# Patient Record
Sex: Female | Born: 1975 | Race: White | Hispanic: No | Smoking: Former smoker
Health system: Southern US, Community
[De-identification: ages and names within clinical notes are randomized; demographics above are authoritative.]

## PROBLEM LIST (undated history)

## (undated) DIAGNOSIS — N809 Endometriosis, unspecified: Secondary | ICD-10-CM

## (undated) DIAGNOSIS — M1990 Unspecified osteoarthritis, unspecified site: Secondary | ICD-10-CM

## (undated) DIAGNOSIS — K219 Gastro-esophageal reflux disease without esophagitis: Secondary | ICD-10-CM

## (undated) DIAGNOSIS — M48061 Spinal stenosis, lumbar region without neurogenic claudication: Secondary | ICD-10-CM

## (undated) DIAGNOSIS — F419 Anxiety disorder, unspecified: Secondary | ICD-10-CM

## (undated) HISTORY — DX: Unspecified osteoarthritis, unspecified site: M19.90

## (undated) HISTORY — DX: Anxiety disorder, unspecified: F41.9

## (undated) HISTORY — PX: WISDOM TOOTH EXTRACTION: SHX21

## (undated) HISTORY — DX: Endometriosis, unspecified: N80.9

## (undated) HISTORY — DX: Spinal stenosis, lumbar region without neurogenic claudication: M48.061

## (undated) HISTORY — PX: ABDOMINAL HYSTERECTOMY: SHX81

---

## 2008-10-06 ENCOUNTER — Encounter: Admission: RE | Admit: 2008-10-06 | Discharge: 2008-10-06 | Payer: Self-pay | Admitting: Family Medicine

## 2009-01-06 ENCOUNTER — Ambulatory Visit: Payer: Self-pay | Admitting: Vascular Surgery

## 2010-05-17 NOTE — Procedures (Signed)
LOWER EXTREMITY VENOUS REFLUX EXAM   INDICATION:  Bilateral lower extremity swelling and pain.   EXAM:  Using color-flow imaging and pulse Doppler spectral analysis, the  bilateral common femoral, superficial femoral, popliteal, posterior  tibial, greater and lesser saphenous veins are evaluated.  There is no  evidence suggesting deep venous insufficiency in the bilateral lower  extremities.   The bilateral saphenofemoral junction is competent. The bilateral GSV is  competent.   The bilateral proximal short saphenous vein demonstrates competency.   GSV Diameter (used if found to be incompetent only)                                            Right    Left  Proximal Greater Saphenous Vein           cm       cm  Proximal-to-mid-thigh                     cm       cm  Mid thigh                                 cm       cm  Mid-distal thigh                          cm       cm  Distal thigh                              cm       cm  Knee                                      cm       cm   IMPRESSION:  1. Bilateral greater saphenous veins are competent.  2. The deep venous system is competent.  3. The bilateral lesser saphenous veins are competent.   ___________________________________________  Janetta Hora. Fields, MD   CJ/MEDQ  D:  01/06/2009  T:  01/06/2009  Job:  [id number]

## 2010-05-17 NOTE — Assessment & Plan Note (Signed)
OFFICE VISIT   Howard, Selena  DOB:  1975-03-16                                       01/06/2009  [id number]#:[id number]   CHIEF COMPLAINT:  Leg swelling.   HISTORY OF PRESENT ILLNESS:  The patient is a 35 year old female  referred from [facility] for evaluation of leg  swelling.  The patient states that she has intermittently been having  swelling of her right ankle and calf for approximately 5-6 months.  She  states it had been present for at least a year but worse over the last  few months.  She does not notice any real relationship of the swelling  to standing.  She has had no relief with TED stockings in the past.  She  has no prior history of DVT.  She has no family history of vein problems  or clotting.  She recently developed a rash on the right dorsal portion  of her foot.  She has had no itching from this.  She occasionally has  some pain in her calf on walking.   She has no other significant past medical or social history.   FAMILY HISTORY:  Unremarkable.   SOCIAL HISTORY:  She is single, has three children.  She is currently a  Consulting civil engineer.  She is a smoker of one pack a day.  She does not consume  alcohol regularly.   REVIEW OF SYSTEMS:  Full 12 point review of systems was performed with  the patient.  Please see intake referral form for details regarding  this.   PHYSICAL EXAM:  Vital signs:  Temperature is 98, blood pressure 96/64,  heart rate 72 and regular.  HEENT:  Unremarkable.  Neck:  Has 2+ carotid  pulses without bruit.  Chest:  Clear to auscultation.  Heart:  Regular  rate and rhythm without murmur.  Abdomen:  Soft, nontender,  nondistended.  No masses.  Extremities:  She has 2+ radial, femoral, 1+  dorsalis pedis and posterior tibial pulses bilaterally.  Musculoskeletal:  Shows no major joint deformities.  Neurologic:  Shows  symmetric upper extremity and lower extremity motor strength which is  5/5 and  symmetric.  Skin:  She has a papular rash approximately 3 cm in  diameter on the dorsal aspect of her right foot near the web space of  toes one and two.  She has no cervical or inguinal lymphadenopathy.   She had a venous duplex ultrasound in our office today which showed no  obvious varicose veins.  She had no reflux across her left or right  greater saphenous vein.  She also has previously had a DVT ultrasound at  [facility] which was negative for DVT.   In summary, the patient has chronic leg swelling of her right leg.  I  doubt this represents lymphedema and it may be lipedema especially with  her weight fluctuating up and down.  I discussed with her today that  although I did not know the exact cause of her leg swelling that the  best option for her probably would be long-term compression stockings.  I have prescribed for her today 25-30 mm compression stockings which  will give her a tighter squeeze than TED stockings alone.  She will  follow up with me on an as-needed basis.     Janetta Hora. Darrick Penna, MD  Electronically Signed   CEF/MEDQ  D:  01/08/2009  T:  01/08/2009  Job:  [id number]   cc:   Aaron Edelman [facility] [place]

## 2020-10-05 ENCOUNTER — Telehealth: Payer: Self-pay

## 2020-10-05 NOTE — Telephone Encounter (Signed)
Yes, I will take Ms Robinette's daughter.  That is fine.

## 2020-10-05 NOTE — Telephone Encounter (Signed)
Are you taking as new patient?

## 2020-10-20 ENCOUNTER — Ambulatory Visit: Payer: Medicaid Other | Admitting: Family Medicine

## 2020-10-20 ENCOUNTER — Encounter: Payer: Self-pay | Admitting: Family Medicine

## 2020-10-20 ENCOUNTER — Other Ambulatory Visit: Payer: Self-pay

## 2020-10-20 VITALS — BP 134/86 | HR 66 | Temp 97.3°F | Ht 63.0 in | Wt 248.4 lb

## 2020-10-20 DIAGNOSIS — Z7689 Persons encountering health services in other specified circumstances: Secondary | ICD-10-CM | POA: Diagnosis not present

## 2020-10-20 DIAGNOSIS — M2550 Pain in unspecified joint: Secondary | ICD-10-CM

## 2020-10-20 DIAGNOSIS — R21 Rash and other nonspecific skin eruption: Secondary | ICD-10-CM

## 2020-10-20 DIAGNOSIS — M48061 Spinal stenosis, lumbar region without neurogenic claudication: Secondary | ICD-10-CM

## 2020-10-20 DIAGNOSIS — K219 Gastro-esophageal reflux disease without esophagitis: Secondary | ICD-10-CM

## 2020-10-20 MED ORDER — BETAMETHASONE VALERATE 0.1 % EX CREA: TOPICAL_CREAM | Freq: Two times a day (BID) | CUTANEOUS | 1 refills | Status: DC

## 2020-10-20 MED ORDER — ESOMEPRAZOLE MAGNESIUM 40 MG PO CPDR: 40.0000 mg | DELAYED_RELEASE_CAPSULE | Freq: Every day | ORAL | 3 refills | Status: DC

## 2020-10-20 MED ORDER — TRAMADOL HCL 50 MG PO TABS: 50.0000 mg | ORAL_TABLET | Freq: Two times a day (BID) | ORAL | 0 refills | Status: DC | PRN

## 2020-10-20 MED ORDER — GABAPENTIN 300 MG PO CAPS: 300.0000 mg | ORAL_CAPSULE | Freq: Three times a day (TID) | ORAL | 3 refills | Status: DC

## 2020-10-20 NOTE — Patient Instructions (Signed)
Come in for fasting labs.  We are checking for autoimmune disease markers, cholesterol, sugar, kidney function and thyroid. Nexium added for acid reflux Betamethasone cream sent for rash on foot Increase gabapentin to 300mg  3 times daily as tolerated.  Small supply of Tramadol (do not recommend long term use) Please call you previous doctor to get results of MRI/ testing ,etc so we can determine need for specialist.  Food Choices for Gastroesophageal Reflux Disease, Adult When you have gastroesophageal reflux disease (GERD), the foods you eat and your eating habits are very important. Choosing the right foods can help ease your discomfort. Think about working with a food expert (dietitian) to help you make good choices. What are tips for following this plan? Reading food labels Look for foods that are low in saturated fat. Foods that may help with your symptoms include: Foods that have less than 5% of daily value (DV) of fat. Foods that have 0 grams of trans fat. Cooking Do not fry your food. Cook your food by baking, steaming, grilling, or broiling. These are all methods that do not need a lot of fat for cooking. To add flavor, try to use herbs that are low in spice and acidity. Meal planning  Choose healthy foods that are low in fat, such as: Fruits and vegetables. Whole grains. Low-fat dairy products. Lean meats, fish, and poultry. Eat small meals often instead of eating 3 large meals each day. Eat your meals slowly in a place where you are relaxed. Avoid bending over or lying down until 2-3 hours after eating. Limit high-fat foods such as fatty meats or fried foods. Limit your intake of fatty foods, such as oils, butter, and shortening. Avoid the following as told by your doctor: Foods that cause symptoms. These may be different for different people. Keep a food diary to keep track of foods that cause symptoms. Alcohol. Drinking a lot of liquid with meals. Eating meals during the  2-3 hours before bed. Lifestyle Stay at a healthy weight. Ask your doctor what weight is healthy for you. If you need to lose weight, work with your doctor to do so safely. Exercise for at least 30 minutes on 5 or more days each week, or as told by your doctor. Wear loose-fitting clothes. Do not smoke or use any products that contain nicotine or tobacco. If you need help quitting, ask your doctor. Sleep with the head of your bed higher than your feet. Use a wedge under the mattress or blocks under the bed frame to raise the head of the bed. Chew sugar-free gum after meals. What foods should eat? Eat a healthy, well-balanced diet of fruits, vegetables, whole grains, low-fat dairy products, lean meats, fish, and poultry. Each person is different. Foods that may cause symptoms in one person may not cause any symptoms in another person. Work with your doctor to find foods that are safe for you. The items listed above may not be a complete list of what you can eat and drink. Contact a food expert for more options. What foods should I avoid? Limiting some of these foods may help in managing the symptoms of GERD. Everyone is different. Talk with a food expert or your doctor to help you find the exact foods to avoid, if any. Fruits Any fruits prepared with added fat. Any fruits that cause symptoms. For some people, this may include citrus fruits, such as oranges, grapefruit, pineapple, and lemons. Vegetables Deep-fried vegetables. [place] fries. Any vegetables prepared with added  fat. Any vegetables that cause symptoms. For some people, this may include tomatoes and tomato products, chili peppers, onions and garlic, and horseradish. Grains Pastries or quick breads with added fat. Meats and other proteins High-fat meats, such as fatty beef or pork, hot dogs, ribs, ham, sausage, salami, and bacon. Fried meat or protein, including fried fish and fried chicken. Nuts and nut butters, in large  amounts. Dairy Whole milk and chocolate milk. Sour cream. Cream. Ice cream. Cream cheese. Milkshakes. Fats and oils Butter. Margarine. Shortening. Ghee. Beverages Coffee and tea, with or without caffeine. Carbonated beverages. Sodas. Energy drinks. Fruit juice made with acidic fruits, such as orange or grapefruit. Tomato juice. Alcoholic drinks. Sweets and desserts Chocolate and cocoa. Donuts. Seasonings and condiments Pepper. Peppermint and spearmint. Added salt. Any condiments, herbs, or seasonings that cause symptoms. For some people, this may include curry, hot sauce, or vinegar-based salad dressings. The items listed above may not be a complete list of what you should not eat and drink. Contact a food expert for more options. Questions to ask your doctor Diet and lifestyle changes are often the first steps that are taken to manage symptoms of GERD. If diet and lifestyle changes do not help, talk with your doctor about taking medicines. Where to find more information International Foundation for Gastrointestinal Disorders: aboutgerd.org Summary When you have GERD, food and lifestyle choices are very important in easing your symptoms. Eat small meals often instead of 3 large meals a day. Eat your meals slowly and in a place where you are relaxed. Avoid bending over or lying down until 2-3 hours after eating. Limit high-fat foods such as fatty meats or fried foods. This information is not intended to replace advice given to you by your health care provider. Make sure you discuss any questions you have with your health care provider. Document Revised: 06/30/2019 Document Reviewed: 06/30/2019 Elsevier Patient Education  [place].

## 2020-10-20 NOTE — Progress Notes (Signed)
Subjective: ME:QASTMHDQQ care, chronic back pain, polyarthralgia, rash, GERD HPI: Selena Howard is a 45 y.o. female presenting to clinic today for:  1.  Chronic back pain/polyarthralgia Patient reports that she has suffered from chronic back pain for some time now.  It radiates to bilateral hips and sometimes down to the bottoms of her feet bilaterally.  She gets some numbness and tingling in the right calf specifically.  She apparently had an MRI with her previous PCP and that apparently demonstrated some spinal stenosis and other arthritic findings.  She unfortunately was never referred to a specialist because she had other medical issues going on with abnormal uterine bleeding and with her parents.  She has been taking gabapentin 200 mg twice daily and tramadol very sparingly as needed for breakthrough pain.  She suffers from chronic constipation so this has not necessarily been exacerbated since starting use of tramadol more frequently.  She does not report any falls or any preceding injuries.  She does admit to some weight gain since her hysterectomy in June but is not sure if that is related to the back pain or other medical issue.  She of course understands that the increased weight exacerbates her back pain and therefore she is motivated to get some of the weight off of her.  She notes more than just back pain however.  Sometimes she has ankle and foot pain.  She notes a rash on the right foot and is currently being treated with some type of solution for her nails.  She wonders though if this is really a psoriatic type finding.  No corticosteroid treatment thus far.  She describes it as itchy and it does scale  2.  GERD Since she has gained the weight she has noticed increased acid reflux.  She has been utilizing Tums quite frequently at bedtime.  She has not utilize any PPI or other over-the-counter remedies for acid reflux.  She reports no vomiting, hematochezia or melena.  No unplanned  weight loss.  3.  Anxiety disorder Patient reports good control of anxiety disorder with fluoxetine 20 mg.  Past Medical History:  Diagnosis Date   Anxiety    Arthritis    Endometriosis    now s/p partial hysterectomy 06/2020   Spinal stenosis at L4-L5 level    Past Surgical History:  Procedure Laterality Date   ABDOMINAL HYSTERECTOMY     partial, ovary sparing   CESAREAN SECTION     WISDOM TOOTH EXTRACTION Bilateral    Social History   Socioeconomic History   Marital status: Divorced    Spouse name: Not on file   Number of children: 3   Years of education: Not on file   Highest education level: Not on file  Occupational History   Not on file  Tobacco Use   Smoking status: Former    Packs/day: 0.50    Years: 10.00    Pack years: 5.00    Types: Cigarettes    Quit date: 06/02/2020    Years since quitting: 0.3   Smokeless tobacco: Never  Vaping Use   Vaping Use: Never used  Substance and Sexual Activity   Alcohol use: Not Currently   Drug use: Not Currently   Sexual activity: Not Currently  Other Topics Concern   Not on file  Social History Narrative   Mother also sees me, [name]   Social Determinants of Health   Financial Resource Strain: Not on file  Food Insecurity: Not on file  Transportation  Needs: Not on file  Physical Activity: Not on file  Stress: Not on file  Social Connections: Not on file  Intimate Partner Violence: Not on file   No outpatient medications have been marked as taking for the 10/20/20 encounter (Appointment) with [name], DO.   Family History  Problem Relation Age of Onset   Diabetes Mother    Liver disease Mother    COPD Father    Diabetes Father    Heart disease Maternal Grandmother    Cancer Maternal Grandfather    Cancer Paternal Grandmother    Diabetes Paternal Grandfather    Allergies  Allergen Reactions   Keflex [Cephalexin] Nausea And Vomiting     Health Maintenance: Flu vaccine  ROS: Per  HPI  Objective: Office vital signs reviewed. BP 134/86   Pulse 66   Temp (!) 97.3 F (36.3 C)   Ht 5' 3"  (1.6 m)   Wt 248 lb 6.4 oz (112.7 kg)   SpO2 99%   BMI 44.00 kg/m   Physical Examination:  General: Awake, alert, well-appearing, morbidly obese, No acute distress HEENT: Normal; moist mucous membranes.  No exophthalmos.  No palpable thyromegaly or thyroid nodules Cardio: regular rate and rhythm, S1S2 heard, no murmurs appreciated Pulm: clear to auscultation bilaterally, no wheezes, rhonchi or rales; normal work of breathing on room air GI: Abdomen obese MSK: Normal gait and station with normal tone.  Patient is ambulating independently Psych: Very pleasant, interactive.  Somewhat anxious on exam.  Depression screen [facility] 2/9 10/20/2020 10/20/2020  Decreased Interest 0 0  Down, Depressed, Hopeless 0 0  PHQ - 2 Score 0 0  Altered sleeping 3 -  Tired, decreased energy 1 -  Change in appetite 0 -  Feeling bad or failure about yourself  0 -  Trouble concentrating 1 -  Moving slowly or fidgety/restless 0 -  Suicidal thoughts 0 -  PHQ-9 Score 5 -  Difficult doing work/chores Not difficult at all -   GAD 7 : Generalized Anxiety Score 10/20/2020  Nervous, Anxious, on Edge 0  Control/stop worrying 0  Worry too much - different things 0  Trouble relaxing 0  Restless 0  Easily annoyed or irritable 0  Afraid - awful might happen 0  Total GAD 7 Score 0  Anxiety Difficulty Not difficult at all   Assessment/ Plan: 45 y.o. female   Rash of foot - Plan: betamethasone valerate (VALISONE) 0.1 % cream, CBC, ANA w/Reflex if Positive, C-reactive protein, Sedimentation Rate  Establishing care with new doctor, encounter for  Spinal stenosis of lumbar region without neurogenic claudication - Plan: gabapentin (NEURONTIN) 300 MG capsule, traMADol (ULTRAM) 50 MG tablet  Gastroesophageal reflux disease without esophagitis - Plan: esomeprazole (NEXIUM) 40 MG capsule  Morbid obesity  ([place]) - Plan: CMP14+EGFR, Lipid panel, TSH, T4, free, Insulin, random, Bayer DCA Hb A1c Waived  Polyarthralgia - Plan: CBC, ANA w/Reflex if Positive, C-reactive protein, Sedimentation Rate  For the rash on her foot I have given her betamethasone to apply.  It does seem suspicious for psoriasis which could also lead to [place] considering psoriatic arthritis as a source of her polyarthralgia.  I placed some autoimmune labs and I would like her to come in for fasting labs and have these collected together.  I am going to advance her gabapentin to 300 mg 3 times daily we discussed that there is some relation to increased weight with this medicine.  So we will need to pay special attention to that.  I reviewed the national narcotic database and have given her a short supply of tramadol.  Reinforced that this is not intended for long-term use and simply will be used only if she is having breakthrough severe pain.  Again, I reinforced need to get records from her previous PCP.  Would like to reevaluate what her MRI results were and we will plan for referral accordingly.  I suspect that her GERD is largely related to the weight gain that she is experienced.  Since symptoms are refractory to Tums we will try short course of Nexium.  I will reassess her in the next 3 to 4 weeks and we can determine the appropriateness of continuing this medicine  We will look for metabolic etiology of obesity as well as look for other concerning features related to obesity.  If nothing from a metabolic standpoint is causing her rapid weight gain, I am open to the idea of treating her pharmacologically with medications to reduce weight as I do think that it poses a risk to her health given BMI of over 40.  She will schedule a full physical to be performed the next 3 to 4 weeks.  She will contact me prior to this time if any concerns or questions arise  Total time spent with patient 45 minutes.  Greater than 50% of encounter spent in  coordination of care/counseling.   [name], DO [place] [phone]

## 2020-10-25 ENCOUNTER — Telehealth: Payer: Self-pay | Admitting: *Deleted

## 2020-10-25 NOTE — Telephone Encounter (Signed)
Key: [id number] - PA Case ID: [id number] - Rx #: [id number] Need help? Call us at [phone] Status Sent to [facility] traMADol HCl 50MG  tablets Form [facility] Electronic [place] Form [phone] NCPDP)

## 2020-10-25 NOTE — Telephone Encounter (Signed)
PA Case: [id number], Status: Approved, Coverage Starts on: 10/25/2020 12:00:00 AM, Coverage Ends on: 04/23/2021 12:00:00 AM.   Pharmacy and patient aware.

## 2020-11-30 ENCOUNTER — Other Ambulatory Visit: Payer: Self-pay

## 2020-11-30 ENCOUNTER — Other Ambulatory Visit: Payer: Medicaid Other

## 2020-11-30 DIAGNOSIS — M2550 Pain in unspecified joint: Secondary | ICD-10-CM

## 2020-11-30 DIAGNOSIS — R21 Rash and other nonspecific skin eruption: Secondary | ICD-10-CM

## 2020-11-30 LAB — BAYER DCA HB A1C WAIVED: HB A1C (BAYER DCA - WAIVED): 5.2 % (ref 4.8–5.6)

## 2020-12-01 ENCOUNTER — Ambulatory Visit (INDEPENDENT_AMBULATORY_CARE_PROVIDER_SITE_OTHER): Payer: Medicaid Other | Admitting: Family Medicine

## 2020-12-01 ENCOUNTER — Other Ambulatory Visit: Payer: Self-pay | Admitting: Family Medicine

## 2020-12-01 ENCOUNTER — Encounter: Payer: Self-pay | Admitting: Family Medicine

## 2020-12-01 VITALS — BP 132/86 | HR 73 | Temp 98.1°F | Ht 63.0 in | Wt 249.2 lb

## 2020-12-01 DIAGNOSIS — Z0001 Encounter for general adult medical examination with abnormal findings: Secondary | ICD-10-CM

## 2020-12-01 DIAGNOSIS — R700 Elevated erythrocyte sedimentation rate: Secondary | ICD-10-CM

## 2020-12-01 DIAGNOSIS — R002 Palpitations: Secondary | ICD-10-CM

## 2020-12-01 DIAGNOSIS — M2550 Pain in unspecified joint: Secondary | ICD-10-CM

## 2020-12-01 DIAGNOSIS — L409 Psoriasis, unspecified: Secondary | ICD-10-CM

## 2020-12-01 DIAGNOSIS — Z0000 Encounter for general adult medical examination without abnormal findings: Secondary | ICD-10-CM

## 2020-12-01 DIAGNOSIS — R7982 Elevated C-reactive protein (CRP): Secondary | ICD-10-CM

## 2020-12-01 DIAGNOSIS — M48061 Spinal stenosis, lumbar region without neurogenic claudication: Secondary | ICD-10-CM

## 2020-12-01 LAB — CBC
Hematocrit: 37.8 % (ref 34.0–46.6)
Hemoglobin: 12.5 g/dL (ref 11.1–15.9)
MCH: 28.5 pg (ref 26.6–33.0)
MCHC: 33.1 g/dL (ref 31.5–35.7)
MCV: 86 fL (ref 79–97)
Platelets: 220 10*3/uL (ref 150–450)
RBC: 4.38 x10E6/uL (ref 3.77–5.28)
RDW: 14.4 % (ref 11.7–15.4)
WBC: 5.8 10*3/uL (ref 3.4–10.8)

## 2020-12-01 LAB — CMP14+EGFR
ALT: 13 IU/L (ref 0–32)
AST: 15 IU/L (ref 0–40)
Albumin/Globulin Ratio: 1.7 (ref 1.2–2.2)
Albumin: 4 g/dL (ref 3.8–4.8)
Alkaline Phosphatase: 71 IU/L (ref 44–121)
BUN/Creatinine Ratio: 21 (ref 9–23)
BUN: 23 mg/dL (ref 6–24)
Bilirubin Total: 0.3 mg/dL (ref 0.0–1.2)
CO2: 21 mmol/L (ref 20–29)
Calcium: 8.9 mg/dL (ref 8.7–10.2)
Chloride: 102 mmol/L (ref 96–106)
Creatinine, Ser: 1.07 mg/dL — ABNORMAL HIGH (ref 0.57–1.00)
Globulin, Total: 2.4 g/dL (ref 1.5–4.5)
Glucose: 91 mg/dL (ref 70–99)
Potassium: 4.1 mmol/L (ref 3.5–5.2)
Sodium: 140 mmol/L (ref 134–144)
Total Protein: 6.4 g/dL (ref 6.0–8.5)
eGFR: 65 mL/min/{1.73_m2} (ref >59)

## 2020-12-01 LAB — INSULIN, RANDOM: INSULIN: 13.6 u[IU]/mL (ref 2.6–24.9)

## 2020-12-01 LAB — SEDIMENTATION RATE: Sed Rate: 34 mm/hr — ABNORMAL HIGH (ref 0–32)

## 2020-12-01 LAB — LIPID PANEL
Chol/HDL Ratio: 2.9 ratio (ref 0.0–4.4)
Cholesterol, Total: 147 mg/dL (ref 100–199)
HDL: 51 mg/dL (ref >39)
LDL Chol Calc (NIH): 80 mg/dL (ref 0–99)
Triglycerides: 86 mg/dL (ref 0–149)
VLDL Cholesterol Cal: 16 mg/dL (ref 5–40)

## 2020-12-01 LAB — ANA W/REFLEX IF POSITIVE: Anti Nuclear Antibody (ANA): NEGATIVE

## 2020-12-01 LAB — C-REACTIVE PROTEIN: CRP: 19 mg/L — ABNORMAL HIGH (ref 0–10)

## 2020-12-01 LAB — TSH: TSH: 1.95 u[IU]/mL (ref 0.450–4.500)

## 2020-12-01 LAB — T4, FREE: Free T4: 0.94 ng/dL (ref 0.82–1.77)

## 2020-12-01 MED ORDER — GABAPENTIN 300 MG PO CAPS
600.0000 mg | ORAL_CAPSULE | Freq: Three times a day (TID) | ORAL | 2 refills | Status: DC
Start: 2020-12-01 — End: 2021-03-14

## 2020-12-01 MED ORDER — INSULIN PEN NEEDLE 32G X 6 MM MISC: 3 refills | Status: DC

## 2020-12-01 MED ORDER — SAXENDA 18 MG/3ML ~~LOC~~ SOPN: PEN_INJECTOR | SUBCUTANEOUS | 99 refills | Status: DC

## 2020-12-01 MED ORDER — TRAMADOL HCL 50 MG PO TABS: 50.0000 mg | ORAL_TABLET | Freq: Two times a day (BID) | ORAL | 0 refills | Status: DC | PRN

## 2020-12-01 NOTE — Patient Instructions (Signed)
Avoid carbonated beverages Eat small meals Hydrate well with water Contact the office if you have severe abdominal pain, uncontrolled nausea and vomiting or fevers I have referred you to the rheumatologist in [facility] have also been referred to the orthopedist who will be visiting our office in December.  They will contact you with appointments We have increased your gabapentin in the meantime Do not take more than prescribed of the ibuprofen

## 2020-12-01 NOTE — Progress Notes (Signed)
Selena Howard is a 45 y.o. female presents to office today for annual physical exam examination.    Concerns today include: 1.  Morbid obesity Patient continues to struggle with morbid obesity.  She reports eating small amounts of food but she continues to have difficulty losing weight.  She had labs done which showed no metabolic etiology of symptoms.  2.  Elevated CRP and ESR Patient continues to have polyarthralgia and low back pain.  She does note that the corticosteroid that was prescribed for her rash has helped significantly.  She would like to proceed with referrals to specialty clinic for these issues.  Continues to use gabapentin as prescribed and this has been somewhat more helpful but she does feel like she would benefit from a dose increase.  She has a few tablets of tramadol on hand and is trying to keep using those sparingly because she does not want to become dependent on that  3.  Heart palpitations Patient reports a couple of occasions where she has had heart palpitations and racing heart.  This is outside of doing any physical activity and has occurred at rest.  Her pulse has gotten as high as 140.  This is worried her quite a bit.  Diet: Typical American, Exercise: No reports of vigorous exercise Last colonoscopy: needs Last mammogram: UTD Last pap smear: n/a Immunizations needed:  There is no immunization history on file for this patient.   Past Medical History:  Diagnosis Date   Anxiety    Arthritis    Endometriosis    now s/p partial hysterectomy 06/2020   Spinal stenosis at L4-L5 level    Social History   Socioeconomic History   Marital status: Divorced    Spouse name: Not on file   Number of children: 3   Years of education: Not on file   Highest education level: Not on file  Occupational History   Not on file  Tobacco Use   Smoking status: Former    Packs/day: 0.50    Years: 10.00    Pack years: 5.00    Types: Cigarettes    Quit date:  06/02/2020    Years since quitting: 0.4   Smokeless tobacco: Never  Vaping Use   Vaping Use: Never used  Substance and Sexual Activity   Alcohol use: Not Currently   Drug use: Not Currently   Sexual activity: Not Currently  Other Topics Concern   Not on file  Social History Narrative   Mother also sees me, Pensions consultant   Social Determinants of Health   Financial Resource Strain: Not on file  Food Insecurity: Not on file  Transportation Needs: Not on file  Physical Activity: Not on file  Stress: Not on file  Social Connections: Not on file  Intimate Partner Violence: Not on file   Past Surgical History:  Procedure Laterality Date   ABDOMINAL HYSTERECTOMY     partial, ovary sparing   CESAREAN SECTION     WISDOM TOOTH EXTRACTION Bilateral    Family History  Problem Relation Age of Onset   Diabetes Mother    Liver disease Mother    COPD Father    Diabetes Father    Heart disease Maternal Grandmother    Cancer Maternal Grandfather    Cancer Paternal Grandmother    Diabetes Paternal Grandfather     Current Outpatient Medications:    amoxicillin (AMOXIL) 500 MG tablet, Take 500 mg by mouth 2 (two) times daily., Disp: , Rfl:  betamethasone valerate (VALISONE) 0.1 % cream, Apply topically 2 (two) times daily. X10-14 days to foot, Disp: 30 g, Rfl: 1   cholecalciferol (VITAMIN D3) 25 MCG (1000 UNIT) tablet, Take by mouth daily., Disp: , Rfl:    esomeprazole (NEXIUM) 40 MG capsule, Take 1 capsule (40 mg total) by mouth daily., Disp: 30 capsule, Rfl: 3   FLUoxetine (PROZAC) 20 MG capsule, Take 20 mg by mouth daily., Disp: , Rfl:    ibuprofen (ADVIL) 800 MG tablet, Take 800 mg by mouth every 8 (eight) hours as needed., Disp: , Rfl:    Insulin Pen Needle 32G X 6 MM MISC, UAD with saxenda, Disp: 100 each, Rfl: 3   Liraglutide -Weight Management (SAXENDA) 18 MG/3ML SOPN, Inject 0.6 mg into the skin daily for 7 days, THEN 1.2 mg daily for 7 days, THEN 1.8 mg daily for 7 days, THEN  2.4 mg daily for 7 days, THEN 3 mg daily., Disp: 12 mL, Rfl: PRN   traMADol (ULTRAM) 50 MG tablet, Take 1 tablet (50 mg total) by mouth every 12 (twelve) hours as needed for severe pain., Disp: 30 tablet, Rfl: 0   ciclopirox (PENLAC) 8 % solution, Apply topically daily. (Patient not taking: Reported on 12/01/2020), Disp: , Rfl:    gabapentin (NEURONTIN) 300 MG capsule, Take 2 capsules (600 mg total) by mouth 3 (three) times daily., Disp: 180 capsule, Rfl: 2  Allergies  Allergen Reactions   Keflex [Cephalexin] Nausea And Vomiting     ROS: Review of Systems Pertinent items noted in HPI and remainder of comprehensive ROS otherwise negative.    Physical exam BP 132/86   Pulse 73   Temp 98.1 F (36.7 C)   Ht 5' 3"  (1.6 m)   Wt 249 lb 3.2 oz (113 kg)   SpO2 98%   BMI 44.14 kg/m  General appearance: alert, cooperative, and morbidly obese Head: Normocephalic, without obvious abnormality, atraumatic Eyes: negative findings: lids and lashes normal, conjunctivae and sclerae normal, corneas clear, and pupils equal, round, reactive to light and accomodation Ears: normal TM's and external ear canals both ears Nose: Nares normal. Septum midline. Mucosa normal. No drainage or sinus tenderness. Throat: lips, mucosa, and tongue normal; teeth and gums normal Neck: no adenopathy, supple, symmetrical, trachea midline, and thyroid not enlarged, symmetric, no tenderness/mass/nodules Back: symmetric, no curvature. ROM normal. No CVA tenderness. Lungs: clear to auscultation bilaterally Heart: regular rate and rhythm, S1, S2 normal, no murmur, click, rub or gallop Abdomen: soft, non-tender; bowel sounds normal; no masses,  no organomegaly Extremities: extremities normal, atraumatic, no cyanosis or edema Pulses: 2+ and symmetric Skin:  Very mild scaly rash now noted at the dorsal aspects of the feet Lymph nodes: Cervical, supraclavicular, and axillary nodes normal. Neurologic: Grossly normal Psych:  Anxious  Depression screen [facility] 2/9 12/01/2020 10/20/2020 10/20/2020  Decreased Interest 0 0 0  Down, Depressed, Hopeless 0 0 0  PHQ - 2 Score 0 0 0  Altered sleeping 3 3 -  Tired, decreased energy 2 1 -  Change in appetite 2 0 -  Feeling bad or failure about yourself  0 0 -  Trouble concentrating 0 1 -  Moving slowly or fidgety/restless 0 0 -  Suicidal thoughts 0 0 -  PHQ-9 Score 7 5 -  Difficult doing work/chores Somewhat difficult Not difficult at all -   GAD 7 : Generalized Anxiety Score 12/01/2020 10/20/2020  Nervous, Anxious, on Edge 1 0  Control/stop worrying 0 0  Worry too much -  different things 0 0  Trouble relaxing 1 0  Restless 1 0  Easily annoyed or irritable 0 0  Afraid - awful might happen 0 0  Total GAD 7 Score 3 0  Anxiety Difficulty Somewhat difficult Not difficult at all   Assessment/ Plan: Carlyn Reichert here for annual physical exam.   Annual physical exam  Morbid obesity ([employer]) - Plan: Liraglutide -Weight Management (SAXENDA) 18 MG/3ML SOPN, Insulin Pen Needle 32G X 6 MM MISC  Elevated C-reactive protein (CRP) - Plan: Ambulatory referral to Rheumatology  Elevated sed rate - Plan: Ambulatory referral to Rheumatology  Psoriasis - Plan: Ambulatory referral to Rheumatology  Polyarthralgia - Plan: Ambulatory referral to Rheumatology  Spinal stenosis of lumbar region without neurogenic claudication - Plan: Ambulatory referral to Orthopedic Surgery, gabapentin (NEURONTIN) 300 MG capsule, traMADol (ULTRAM) 50 MG tablet  Heart palpitations - Plan: Ambulatory referral to Cardiology  Needs referral to gastroenterology for colonoscopy.  Due for flu vaccination  I am going to trial her on a sample of Saxenda but we discussed that her insurance may not cover.  She is to contact her insurance company to see what is available to her.  She had elevated CRP and sed rate, mild, on recent lab work.  ANA was negative.  Given polyarthralgia and what appears to be  psoriasis on her foot I have placed a referral to rheumatology for further evaluation ?  Psoriatic arthritis  I placed a referral to orthopedics as well for her low back pain.  She was noted to have mild thoracolumbar spondylosis on her most recent CAT scan, which is scanned into the media tab.  I have advanced her gabapentin.  Tramadol renewed to place on file.  Continue to use very sparingly  For heart palpitations, she had no abnormal findings on exam today.  Again, her laboratory work-up was unremarkable.  May benefit from [facility] patch.  Referral to cardiology placed  Counseled on healthy lifestyle choices, including diet (rich in fruits, vegetables and lean meats and low in salt and simple carbohydrates) and exercise (at least 30 minutes of moderate physical activity daily).  Kiyoto Slomski M. Lajuana Ripple, DO

## 2020-12-03 NOTE — Telephone Encounter (Signed)
Please inform the patient that her insurance will NOT COVER weight loss medications.  If she'd like, I can refer to healthy weight and wellness in [place]

## 2020-12-06 ENCOUNTER — Telehealth: Payer: Self-pay | Admitting: Family Medicine

## 2020-12-06 NOTE — Telephone Encounter (Signed)
Pt called to let Dr Lajuana Ripple know that the [employer] is not covered by her insurance and wants to know if we can send PA for it or what options are?

## 2020-12-07 NOTE — Telephone Encounter (Signed)
Refusal did not transmitted, retrying

## 2020-12-07 NOTE — Telephone Encounter (Signed)
Key: [id number]) Saxenda 18MG /3ML pen-injectors Sent to plan today

## 2020-12-07 NOTE — Addendum Note (Signed)
Addended by: Antonietta Barcelona D on: 12/07/2020 08:23 AM   Modules accepted: Orders

## 2020-12-07 NOTE — Progress Notes (Signed)
CARDIOLOGY CONSULT NOTE       Patient ID: Selena Howard MRN: [id number] DOB/AGE: 45-Dec-1977 45 y.o.  Admit date: (Not on file) Referring Physician: Lajuana Ripple Primary Physician: [name], DO Primary Cardiologist: New Reason for Consultation: Palpitations  Active Problems:   * No active hospital problems. *   HPI:  45 y.o. referred by DR Lajuana Ripple for palpitations overweight  PMR with elevated ESR She has had infrequent episodes of palpitations with racing heart at rest Due to her obesity her HR is high with activity She thinks her pulse has risen to as high as 140 at rest No chest pain, syncope She indicates quitting smoking in June   Lab review shows elevated CRP 19 and ESR 34 LDL 74 A1c 5 Hct 37.8 TSH 1.95   Since being on Prozac palpitations and anxiety better Palpitations self limited and more at rest No excess ETOH rare Truly intake.   She has 3 kids ages 71-27 Divorced She does not work and lives with her Parents whose health is poor   ROS All other systems reviewed and negative except as noted above  Past Medical History:  Diagnosis Date   Anxiety    Arthritis    Endometriosis    now s/p partial hysterectomy 06/2020   Spinal stenosis at L4-L5 level     Family History  Problem Relation Age of Onset   Diabetes Mother    Liver disease Mother    COPD Father    Diabetes Father    Heart disease Maternal Grandmother    Cancer Maternal Grandfather    Cancer Paternal Grandmother    Diabetes Paternal Grandfather     Social History   Socioeconomic History   Marital status: Divorced    Spouse name: Not on file   Number of children: 3   Years of education: Not on file   Highest education level: Not on file  Occupational History   Not on file  Tobacco Use   Smoking status: Former    Packs/day: 0.50    Years: 10.00    Pack years: 5.00    Types: Cigarettes    Quit date: 06/02/2020    Years since quitting: 0.5   Smokeless tobacco: Never  Vaping  Use   Vaping Use: Never used  Substance and Sexual Activity   Alcohol use: Not Currently   Drug use: Not Currently   Sexual activity: Not Currently  Other Topics Concern   Not on file  Social History Narrative   Mother also sees me, Pensions consultant   Social Determinants of Health   Financial Resource Strain: Not on file  Food Insecurity: Not on file  Transportation Needs: Not on file  Physical Activity: Not on file  Stress: Not on file  Social Connections: Not on file  Intimate Partner Violence: Not on file    Past Surgical History:  Procedure Laterality Date   ABDOMINAL HYSTERECTOMY     partial, ovary sparing   CESAREAN SECTION     WISDOM TOOTH EXTRACTION Bilateral       Current Outpatient Medications:    amoxicillin (AMOXIL) 500 MG tablet, Take 500 mg by mouth 2 (two) times daily., Disp: , Rfl:    betamethasone valerate (VALISONE) 0.1 % cream, Apply topically 2 (two) times daily. X10-14 days to foot, Disp: 30 g, Rfl: 1   cholecalciferol (VITAMIN D3) 25 MCG (1000 UNIT) tablet, Take by mouth daily., Disp: , Rfl:    ciclopirox (PENLAC) 8 % solution, Apply topically daily.,  Disp: , Rfl:    esomeprazole (NEXIUM) 40 MG capsule, Take 1 capsule (40 mg total) by mouth daily., Disp: 30 capsule, Rfl: 3   FLUoxetine (PROZAC) 20 MG capsule, Take 20 mg by mouth daily., Disp: , Rfl:    gabapentin (NEURONTIN) 300 MG capsule, Take 2 capsules (600 mg total) by mouth 3 (three) times daily., Disp: 180 capsule, Rfl: 2   ibuprofen (ADVIL) 800 MG tablet, Take 800 mg by mouth every 8 (eight) hours as needed., Disp: , Rfl:    Insulin Pen Needle 32G X 6 MM MISC, UAD with saxenda, Disp: 100 each, Rfl: 3   Liraglutide -Weight Management (SAXENDA) 18 MG/3ML SOPN, Inject 0.6 mg into the skin daily for 7 days, THEN 1.2 mg daily for 7 days, THEN 1.8 mg daily for 7 days, THEN 2.4 mg daily for 7 days, THEN 3 mg daily., Disp: 12 mL, Rfl: PRN   traMADol (ULTRAM) 50 MG tablet, Take 1 tablet (50 mg total) by  mouth every 12 (twelve) hours as needed for severe pain. Put on file, Disp: 30 tablet, Rfl: 0    Physical Exam: Height _0  (1.6 m), weight 255 lb (115.7 kg).    Affect appropriate Overweight  female  HEENT: normal Neck supple with no adenopathy JVP normal no bruits no thyromegaly Lungs clear with no wheezing and good diaphragmatic motion Heart:  S1/S2 no murmur, no rub, gallop or click PMI normal Abdomen: benighn, BS positve, no tenderness, no AAA no bruit.  No HSM or HJR Distal pulses intact with no bruits No edema Neuro non-focal Skin warm and dry No muscular weakness   Labs:   Lab Results  Component Value Date   WBC 5.8 11/30/2020   HGB 12.5 11/30/2020   HCT 37.8 11/30/2020   MCV 86 11/30/2020   PLT 220 11/30/2020   No results for input(s): NA, K, CL, CO2, BUN, CREATININE, CALCIUM, PROT, BILITOT, ALKPHOS, ALT, AST, GLUCOSE in the last 168 hours.  Invalid input(s): LABALBU No results found for: CKTOTAL, CKMB, CKMBINDEX, TROPONINI  Lab Results  Component Value Date   CHOL 147 11/30/2020   Lab Results  Component Value Date   HDL 51 11/30/2020   Lab Results  Component Value Date   LDLCALC 80 11/30/2020   Lab Results  Component Value Date   TRIG 86 11/30/2020   Lab Results  Component Value Date   CHOLHDL 2.9 11/30/2020   No results found for: LDLDIRECT    Radiology: No results found.  EKG: SR low voltage no pre excitation June 03, 2020 12/08/2020 NSR rate 60 normal    ASSESSMENT AND PLAN:   Palpitations: benign sounding check echo to r/o structural heart dx Baseline ECG also benign and normal no need for monitoring TTE to r/o structural heart Disease Obesity:  suggest primary referral to [facility] and consider bariatric surgery  Anxiety/Depression:  continue Prozac  DM: Discussed low carb diet.  Target hemoglobin A1c is 6.5 or less.  Continue current medications.  TTE  F/U PRN   Signed: Jenkins Rouge 12/08/2020, 3:08 PM

## 2020-12-07 NOTE — Telephone Encounter (Signed)
Pt insurance has changed  She now has [employer]  I have updated CMM - and sent to plan  Will notify billing of changes

## 2020-12-07 NOTE — Telephone Encounter (Signed)
PA started for Saxenda  Error on pt eligibility - not found - pt called to ask about current plan - NA _ mailbox full

## 2020-12-08 ENCOUNTER — Ambulatory Visit: Payer: Medicaid Other | Admitting: Cardiovascular Disease

## 2020-12-08 ENCOUNTER — Other Ambulatory Visit: Payer: Self-pay

## 2020-12-08 ENCOUNTER — Other Ambulatory Visit: Payer: Self-pay | Admitting: Family Medicine

## 2020-12-08 ENCOUNTER — Encounter: Payer: Self-pay | Admitting: Cardiovascular Disease

## 2020-12-08 VITALS — BP 120/80 | HR 60 | Ht 63.0 in | Wt 255.0 lb

## 2020-12-08 DIAGNOSIS — R002 Palpitations: Secondary | ICD-10-CM

## 2020-12-08 NOTE — Telephone Encounter (Signed)
Patient notified and verbalized understanding. Patient does not want a referral for health and wellness at this time.

## 2020-12-08 NOTE — Patient Instructions (Signed)
Medication Instructions:  Your physician recommends that you continue on your current medications as directed. Please refer to the Current Medication list given to you today.  *If you need a refill on your cardiac medications before your next appointment, please call your pharmacy*   Lab Work: NONE   If you have labs (blood work) drawn today and your tests are completely normal, you will receive your results only by: MyChart Message (if you have MyChart) OR A paper copy in the mail If you have any lab test that is abnormal or we need to change your treatment, we will call you to review the results.   Testing/Procedures: Your physician has requested that you have an echocardiogram. Echocardiography is a painless test that uses sound waves to create images of your heart. It provides your doctor with information about the size and shape of your heart and how well your heart's chambers and valves are working. This procedure takes approximately one hour. There are no restrictions for this procedure.    Follow-Up: At [facility], you and your health needs are our priority.  As part of our continuing mission to provide you with exceptional heart care, we have created designated Provider Care Teams.  These Care Teams include your primary Cardiologist (physician) and Advanced Practice Providers (APPs -  Physician Assistants and Nurse Practitioners) who all work together to provide you with the care you need, when you need it.  We recommend signing up for the patient portal called "MyChart".  Sign up information is provided on this After Visit Summary.  MyChart is used to connect with patients for Virtual Visits (Telemedicine).  Patients are able to view lab/test results, encounter notes, upcoming appointments, etc.  Non-urgent messages can be sent to your provider as well.   To learn more about what you can do with MyChart, go to [email or web address].    Your next appointment:    As Needed    The format for your next appointment:   In Person  Provider:   Peter Nishan, MD   Other Instructions Thank you for choosing [facility]!    

## 2020-12-08 NOTE — Telephone Encounter (Signed)
Message from Plan PA Case: [id number], Status: Denied. Notification: Completed.  No reason given in response

## 2020-12-08 NOTE — Telephone Encounter (Signed)
Please inform pt that her insurance does NOT cover med.  No alternatives. I believe this is a Medicaid thing with weight loss meds.  If she'd like to see healthy weight and wellness, I'm glad to place referral

## 2020-12-16 ENCOUNTER — Ambulatory Visit (INDEPENDENT_AMBULATORY_CARE_PROVIDER_SITE_OTHER): Payer: Medicaid Other

## 2020-12-16 ENCOUNTER — Ambulatory Visit (INDEPENDENT_AMBULATORY_CARE_PROVIDER_SITE_OTHER): Payer: Medicaid Other | Admitting: Orthopaedic Surgery

## 2020-12-16 ENCOUNTER — Other Ambulatory Visit: Payer: Self-pay

## 2020-12-16 ENCOUNTER — Encounter: Payer: Self-pay | Admitting: Orthopaedic Surgery

## 2020-12-16 VITALS — Ht 63.0 in | Wt 248.0 lb

## 2020-12-16 DIAGNOSIS — M545 Low back pain, unspecified: Secondary | ICD-10-CM

## 2020-12-16 DIAGNOSIS — M5136 Other intervertebral disc degeneration, lumbar region: Secondary | ICD-10-CM | POA: Diagnosis not present

## 2020-12-16 DIAGNOSIS — G8929 Other chronic pain: Secondary | ICD-10-CM

## 2020-12-16 DIAGNOSIS — R200 Anesthesia of skin: Secondary | ICD-10-CM | POA: Diagnosis not present

## 2020-12-16 DIAGNOSIS — M47816 Spondylosis without myelopathy or radiculopathy, lumbar region: Secondary | ICD-10-CM | POA: Insufficient documentation

## 2020-12-16 NOTE — Progress Notes (Addendum)
Office Visit Note   Patient: Selena Howard           Date of Birth: Sep 24, 1975           MRN: [id number] Visit Date: 12/16/2020              Requested by: [name], DO [address] [place],  [place] [address] PCP: [name], DO   Assessment & Plan: Visit Diagnoses:  1. Chronic bilateral low back pain, unspecified whether sciatica present   2. Bilateral hand numbness   3. Other intervertebral disc degeneration, lumbar region     Plan: We will set patient up for some therapy at [facility] for evaluation of low back pain symptoms.  Should get some wrist splints at [facility] she can wear at night for hand numbness.  This may be carpal tunnel syndrome.  We will check her back again in 6 weeks.  Pathophysiology and outlined treatment plan discussed.  MRI scan results were reviewed in detail.  She is not having claudication symptoms and only has some mild central stenosis.  Hopefully should get improvement in her symptoms with therapy and core strengthening program.  Follow-Up Instructions: Return in about 6 weeks (around 01/27/2021).   Orders:  Orders Placed This Encounter  Procedures   XR Lumbar Spine Complete   Ambulatory referral to Physical Therapy   No orders of the defined types were placed in this encounter.     Procedures: No procedures performed   Clinical Data: No additional findings.   Subjective: Chief Complaint  Patient presents with   Lower Back - Pain    HPI 45 year old female here with back pain that radiates evenly right and left occasionally right might be a little bit worse.  She is noted that she has numbness and tingling in her fingers and is worried if this is related to her "spinal stenosis in the lumbar region".  She was told she had a elevated sed rate and is being referred to rheumatology clinic.  She had some GYN issues ultimately had a hysterectomy and states since that time she has continued to have ongoing back pain problems.   MRI scan was obtained and done in [place].  I do not have the images but I do have the report and it shows mild disc bulging and mild facet hypertrophy L3 and L4 with mild stenosis.  Right L3 foraminal narrowing.  Facet hypertrophy at L5-S1 worse on the left than right without compression.  Patient is very concerned about her spinal stenosis.  Patient is also had CT abdomen and pelvis no evidence of kidney stones.  BMI is 43.  She denies neurogenic claudication symptoms.  Her symptoms are at the lumbosacral junction sometimes occasionally more on the right side than left side.  Numbness in her hands bothers her at night she sometimes has to move her hands and wrist for she falls back to sleep she states this happens possibly 2-3 times per week.  Patient has been on gabapentin also Prozac she takes vitamin D and also tramadol as needed for pain.  Previous C-sections and then later hysterectomy.  Review of Systems all the systems noncontributory to HPI.   Objective: Vital Signs: Ht 5\' 3"  (1.6 m)    Wt 248 lb (112.5 kg)    BMI 43.93 kg/m   Physical Exam Constitutional:      Appearance: She is well-developed.  HENT:     Head: Normocephalic.     Right Ear:  External ear normal.     Left Ear: External ear normal. There is no impacted cerumen.  Eyes:     Pupils: Pupils are equal, round, and reactive to light.  Neck:     Thyroid: No thyromegaly.     Trachea: No tracheal deviation.  Cardiovascular:     Rate and Rhythm: Normal rate.  Pulmonary:     Effort: Pulmonary effort is normal.  Abdominal:     Palpations: Abdomen is soft.  Musculoskeletal:     Cervical back: No rigidity.  Skin:    General: Skin is warm and dry.  Neurological:     Mental Status: She is alert and oriented to person, place, and time.  Psychiatric:        Behavior: Behavior normal.    Ortho Exam patient has some right and left mild to moderate trochanteric bursal tenderness.  Negative logroll the hips no hip flexion  contracture.  Reflexes 2+ symmetrical she is able to heel and toe walk.  Discomfort with flexion rotation and extension of the lumbar spine.  Specialty Comments:  No specialty comments available.  Imaging: XR Lumbar Spine Complete  Result Date: 12/16/2020 AP lateral lumbar spine images with lateral flexion-extension views obtained and reviewed.  This shows tiny endplate spurring mid lumbar region without disc base narrowing.  Minimal facet enlargement at L5-S1.  Negative for compression fracture no spondylolisthesis. Impression: Minimal endplate lumbar changes with mild facet degenerative changes.    PMFS History: Patient Active Problem List   Diagnosis Date Noted   Bilateral hand numbness 12/16/2020   Other intervertebral disc degeneration, lumbar region 12/16/2020   Past Medical History:  Diagnosis Date   Anxiety    Arthritis    Endometriosis    now s/p partial hysterectomy 06/2020   Spinal stenosis at L4-L5 level     Family History  Problem Relation Age of Onset   Diabetes Mother    Liver disease Mother    COPD Father    Diabetes Father    Heart disease Maternal Grandmother    Cancer Maternal Grandfather    Cancer Paternal Grandmother    Diabetes Paternal Grandfather     Past Surgical History:  Procedure Laterality Date   ABDOMINAL HYSTERECTOMY     partial, ovary sparing   CESAREAN SECTION     WISDOM TOOTH EXTRACTION Bilateral    Social History   Occupational History   Not on file  Tobacco Use   Smoking status: Former    Packs/day: 0.50    Years: 10.00    Pack years: 5.00    Types: Cigarettes    Quit date: 06/02/2020    Years since quitting: 0.5   Smokeless tobacco: Never  Vaping Use   Vaping Use: Never used  Substance and Sexual Activity   Alcohol use: Not Currently   Drug use: Not Currently   Sexual activity: Not Currently

## 2020-12-29 ENCOUNTER — Other Ambulatory Visit: Payer: Self-pay

## 2020-12-29 ENCOUNTER — Encounter (HOSPITAL_COMMUNITY): Payer: Self-pay

## 2020-12-29 ENCOUNTER — Ambulatory Visit (HOSPITAL_COMMUNITY): Payer: Medicaid Other | Attending: Orthopaedic Surgery

## 2020-12-29 DIAGNOSIS — G8929 Other chronic pain: Secondary | ICD-10-CM | POA: Insufficient documentation

## 2020-12-29 DIAGNOSIS — M6281 Muscle weakness (generalized): Secondary | ICD-10-CM | POA: Insufficient documentation

## 2020-12-29 DIAGNOSIS — M545 Low back pain, unspecified: Secondary | ICD-10-CM | POA: Diagnosis not present
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
## 2021-01-05 ENCOUNTER — Ambulatory Visit (HOSPITAL_COMMUNITY): Payer: Medicaid Other | Admitting: Physical Therapy

## 2021-01-12 ENCOUNTER — Encounter (HOSPITAL_COMMUNITY): Payer: Self-pay

## 2021-01-12 ENCOUNTER — Ambulatory Visit (HOSPITAL_COMMUNITY): Payer: Medicaid Other | Attending: Orthopaedic Surgery

## 2021-01-12 ENCOUNTER — Other Ambulatory Visit: Payer: Self-pay

## 2021-01-12 DIAGNOSIS — G8929 Other chronic pain: Secondary | ICD-10-CM | POA: Diagnosis present

## 2021-01-12 DIAGNOSIS — M6281 Muscle weakness (generalized): Secondary | ICD-10-CM | POA: Insufficient documentation

## 2021-01-12 DIAGNOSIS — M545 Low back pain, unspecified: Secondary | ICD-10-CM | POA: Insufficient documentation

## 2021-01-12 NOTE — Therapy (Signed)
[place], [place], [address] Phone: [phone]   Fax:  [phone]  Physical Therapy Treatment  Patient Details  Name: Selena Howard MRN: [id number] Date of Birth: 06/20/1975 Referring Provider (PT): Dr. Rodell Perna   Encounter Date: 01/12/2021   PT End of Session - 01/12/21 1129     Visit Number 2    Number of Visits 6    Date for PT Re-Evaluation 02/09/21    Authorization Type Medicaid [employer], auth form attached    PT Start Time 1127    PT Stop Time 1207    PT Time Calculation (min) 40 min    Activity Tolerance Patient tolerated treatment well;No increased pain    Behavior During Therapy [facility] for tasks assessed/performed             Past Medical History:  Diagnosis Date   Anxiety    Arthritis    Endometriosis    now s/p partial hysterectomy 06/2020   Spinal stenosis at L4-L5 level     Past Surgical History:  Procedure Laterality Date   ABDOMINAL HYSTERECTOMY     partial, ovary sparing   CESAREAN SECTION     WISDOM TOOTH EXTRACTION Bilateral     There were no vitals filed for this visit.   Subjective Assessment - 01/12/21 1128     Subjective Pt reports same back pain going down into legs. Pt reports yesterday pain was bad without doing anything different, today is "normal" pain. Pt reports going to rheumatoligists at the end of the month. Pt reports prone positioning makes the pain worse, can't do it always.    Currently in Pain? Yes    Pain Score 6     Pain Location Back    Pain Orientation Lower    Pain Descriptors / Indicators Aching    Pain Type Chronic pain                 [facility] Adult PT Treatment/Exercise - 01/12/21 0001       Lumbar Exercises: Supine   Ab Set 20 reps;3 seconds    AB Set Limitations with exhale    Glut Set 20 reps;3 seconds    Heel Slides 10 reps    Heel Slides Limitations hooklying position with ab set, exhale    Bent Knee Raise 10 reps     Bent Knee Raise Limitations hooklying position with ab set, exhale    Dead Bug 5 reps;5 seconds    Dead Bug Limitations isometric hold with knees at 90/90    Bridge 10 reps    Bridge with clamshell 10 reps    Bridge with [employer] Limitations gait belt around thighs                     PT Education - 01/12/21 1208     Education Details Exercise technique, anatomy, reviewed goals, provided written/illustrated HEP    Person(s) Educated Patient    Methods Explanation;Demonstration;Handout    Comprehension Verbalized understanding;Returned demonstration              PT Short Term Goals - 01/12/21 1125       PT SHORT TERM GOAL #1   Title Patient will be independent with HEP in order to improve functional outcomes.    Time 3    Period Weeks    Status On-going    Target Date 01/19/21      PT SHORT TERM GOAL #2  Title Identify movement patter that provides centralization of LE symptoms    Baseline unknown if flexion or extension increase symptoms    Time 3    Period Weeks    Status On-going    Target Date 01/19/21               PT Long Term Goals - Jan 16, 2021 1125       PT LONG TERM GOAL #1   Title Increase trunk strength to 4/5 to improve lumbar stability to reduce pain    Baseline 3/5 flexion, 3-/5 extension    Time 6    Period Weeks    Status On-going    Target Date 02/09/21      PT LONG TERM GOAL #2   Title Demo increased lumbar extension ROM to 25% limited to improve body mechanics    Baseline 50% limited    Time 6    Period Weeks    Status On-going    Target Date 02/09/21                   Plan - Jan 16, 2021 1209     Clinical Impression Statement Began session with goal review, HEP review, education on therapeutic process, and education on anatomy. Initiated core strengthening exercises with isometric TA and glute exercises with good activation, focus on avoiding breath holding. Pt with good muscle activation, provided verbal and  tactile cues as needed for form and to decrease compensations. Pt without increased pain at EOS. Continue to progress as able.    Personal Factors and Comorbidities Time since onset of injury/illness/exacerbation;Fitness    Examination-Activity Limitations Squat;Stand;Lift;Carry;Bend;Locomotion Level;Sit    Examination-Participation Restrictions Cleaning;Community Activity;Driving;Yard Work;Shop;Meal Prep    Stability/Clinical Decision Making Stable/Uncomplicated    Rehab Potential Fair    PT Frequency 1x / week    PT Duration 6 weeks    PT Treatment/Interventions ADLs/Self Care Home Management;Electrical Stimulation;Traction;Gait training;Functional mobility training;Therapeutic activities;Therapeutic exercise;Balance training;Neuromuscular re-education;Manual techniques;Dry needling;Spinal Manipulations;Joint Manipulations    PT Next Visit Plan assess if extension-bias (prone, prone on elbows) minimized LE symptoms or worse    PT Home Exercise Plan prone, prone on elbows; 01/16/2022: ab set, dead bug isometric hold, bridge    Consulted and Agree with Plan of Care Patient             Patient will benefit from skilled therapeutic intervention in order to improve the following deficits and impairments:  Decreased range of motion, Decreased strength, Impaired perceived functional ability, Pain, Obesity  Visit Diagnosis: Chronic bilateral low back pain, unspecified whether sciatica present  Muscle weakness (generalized)     Problem List Patient Active Problem List   Diagnosis Date Noted   Bilateral hand numbness 12/16/2020   Other intervertebral disc degeneration, lumbar region 12/16/2020     Talbot Grumbling PT, DPT January 16, 2021, 12:11 PM    [place], [place], [address] Phone: [phone]   Fax:  [phone]  Name: Ladawn Boullion MRN: [id number] Date of Birth: [birth year]

## 2021-01-14 NOTE — Progress Notes (Signed)
Office Visit Note  Patient: Selena Howard             Date of Birth: 1975/09/12           MRN: [id number]             PCP: [name], DO Referring: [name], DO Visit Date: 01/28/2021 Occupation: _0 @  Subjective:  Pain in multiple joints   History of Present Illness: Selena Howard is a 46 y.o. female seen in consultation per request of her PCP.  According to her she has had pain in multiple joints over the last 3 years which has been progressively getting worse.  She is experiencing a lot of lower back pain and eventually underwent hysterectomy in June 2022.  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  She is going for physical therapy.  She states she has been experiencing increased numbness and tingling in her bilateral hands and her bilateral feet over the last 1 year.  She experiences discomfort in her bilateral hands, bilateral ankles, bilateral feet and her knee joints.  She has noticed some swelling in her hands and her feet.  She states she also has nocturnal pain.  She gives history of increased pain on contact.  She experiences some generalized dull ache.  She also has some discomfort in the trochanteric region.  There is family history of fibromyalgia in her both parents.  She is gravida 3, para 3.  There is no history of DVTs.  Activities of Daily Living:  Patient reports morning stiffness for 1 hour.   Patient Reports nocturnal pain.  Difficulty dressing/grooming: Reports Difficulty climbing stairs: Reports Difficulty getting out of chair: Reports Difficulty using hands for taps, buttons, cutlery, and/or writing: Reports  Review of Systems  Constitutional:  Positive for fatigue and weight gain. Negative for night sweats and weight loss.  HENT: Negative.  Negative for mouth sores, trouble swallowing, trouble swallowing, mouth dryness and nose dryness.   Eyes:  Negative for pain, redness and dryness.   Respiratory: Negative.  Negative for cough, shortness of breath and difficulty breathing.   Cardiovascular:  Positive for palpitations. Negative for chest pain, hypertension, irregular heartbeat and swelling in legs/feet.       Evaluated by the cardiologist  Gastrointestinal:  Positive for heartburn. Negative for blood in stool, constipation and diarrhea.  Endocrine: Positive for cold intolerance. Negative for increased urination.  Genitourinary: Negative.  Negative for vaginal dryness.  Musculoskeletal:  Positive for joint pain, gait problem, joint pain, myalgias, morning stiffness and myalgias. Negative for joint swelling, muscle weakness and muscle tenderness.  Skin:  Negative for color change, rash, hair loss, skin tightness, ulcers and sensitivity to sunlight.  Allergic/Immunologic: Negative.  Negative for susceptible to infections.  Neurological:  Positive for headaches. Negative for dizziness, memory loss, night sweats and weakness.  Hematological: Negative.  Negative for swollen glands.  Psychiatric/Behavioral:  Positive for sleep disturbance. Negative for depressed mood, suicidal ideas and self-injury. The patient is nervous/anxious.    PMFS History:  Patient Active Problem List   Diagnosis Date Noted   History of gastroesophageal reflux (GERD) 01/28/2021   Vitamin D deficiency 01/28/2021   History of anxiety 01/28/2021   Bilateral hand numbness 12/16/2020   Other intervertebral disc degeneration, lumbar region 12/16/2020    Past Medical History:  Diagnosis Date   Anxiety    Arthritis    Endometriosis    now s/p partial hysterectomy 06/2020   Spinal  stenosis at L4-L5 level     Family History  Problem Relation Age of Onset   Diabetes Mother    Liver disease Mother    COPD Father    Diabetes Father    Heart disease Maternal Grandmother    Cancer Maternal Grandfather    Cancer Paternal Grandmother    Diabetes Paternal Grandfather    Past Surgical History:  Procedure  Laterality Date   ABDOMINAL HYSTERECTOMY     partial, ovary sparing   CESAREAN SECTION     WISDOM TOOTH EXTRACTION Bilateral    Social History   Social History Narrative   Mother also sees me, Butch Penny Robinette    There is no immunization history on file for this patient.   Objective: Vital Signs: Ht _0  (1.6 m)    Wt 250 lb (113.4 kg)    BMI 44.29 kg/m    Physical Exam Vitals and nursing note reviewed.  Constitutional:      Appearance: She is well-developed.  HENT:     Head: Normocephalic and atraumatic.  Eyes:     Conjunctiva/sclera: Conjunctivae normal.  Cardiovascular:     Rate and Rhythm: Normal rate and regular rhythm.     Heart sounds: Normal heart sounds.  Pulmonary:     Effort: Pulmonary effort is normal.     Breath sounds: Normal breath sounds.  Abdominal:     General: Bowel sounds are normal.     Palpations: Abdomen is soft.  Musculoskeletal:     Cervical back: Normal range of motion.  Lymphadenopathy:     Cervical: No cervical adenopathy.  Skin:    General: Skin is warm and dry.     Capillary Refill: Capillary refill takes less than 2 seconds.  Neurological:     Mental Status: She is alert and oriented to person, place, and time.  Psychiatric:        Behavior: Behavior normal.     Musculoskeletal Exam: C-spine thoracic and lumbar spine were in good range of motion.  She had discomfort range of motion of her lumbar spine.  Shoulder joints, elbow joints, wrist joints, MCPs PIPs and DIPs with good range of motion.  She has prominence of bilateral first PIP joints and the right fifth DIP joint.  No synovitis was noted.  Hip joints and knee joints with good range of motion without any warmth swelling or effusion.  There was no tenderness over ankles or MTPs.  First MTP prominence was noted.  She had generalized hyperalgesia and positive tender points.  She had tenderness over bilateral trochanteric region.  CDAI Exam: CDAI Score: -- Patient Global: --;  Provider Global: -- Swollen: --; Tender: -- Joint Exam 01/28/2021   No joint exam has been documented for this visit   There is currently no information documented on the homunculus. Go to the Rheumatology activity and complete the homunculus joint exam.  Investigation: No additional findings.  Imaging: ECHOCARDIOGRAM COMPLETE  Result Date: 01/18/2021    ECHOCARDIOGRAM REPORT   Patient Name:   [facility] Date of Exam: 01/18/2021 Medical Rec #:  [id number]      Height:       63.0 in Accession #:    [id number]     Weight:       248.0 lb Date of Birth:  January 04, 1975     BSA:          2.119 m Patient Age:    61 years       BP:  133/80 mmHg Patient Gender: F              HR:           74 bpm. Exam Location:  [facility] Procedure: 2D Echo, Cardiac Doppler and Color Doppler Indications:    R00.2 (ICD-10-CM) - Palpitations  History:        Patient has no prior history of Echocardiogram examinations.                 Risk Factors:Former Smoker. Hx of COVID-19.  Sonographer:    Alvino Chapel RCS Referring Phys: [place]  1. Left ventricular ejection fraction, by estimation, is 60 to 65%. The left ventricle has normal function. The left ventricle has no regional wall motion abnormalities. Left ventricular diastolic parameters were normal.  2. Right ventricular systolic function is normal. The right ventricular size is normal. Tricuspid regurgitation signal is inadequate for assessing PA pressure.  3. The mitral valve is normal in structure. No evidence of mitral valve regurgitation. No evidence of mitral stenosis.  4. The aortic valve is tricuspid. Aortic valve regurgitation is not visualized. No aortic stenosis is present.  5. The inferior vena cava is normal in size with greater than 50% respiratory variability, suggesting right atrial pressure of 3 mmHg. FINDINGS  Left Ventricle: Left ventricular ejection fraction, by estimation, is 60 to 65%. The left ventricle has normal  function. The left ventricle has no regional wall motion abnormalities. The left ventricular internal cavity size was normal in size. There is  no left ventricular hypertrophy. Left ventricular diastolic parameters were normal. Right Ventricle: The right ventricular size is normal. No increase in right ventricular wall thickness. Right ventricular systolic function is normal. Tricuspid regurgitation signal is inadequate for assessing PA pressure. Left Atrium: Left atrial size was normal in size. Right Atrium: Right atrial size was normal in size. Pericardium: There is no evidence of pericardial effusion. Mitral Valve: The mitral valve is normal in structure. No evidence of mitral valve regurgitation. No evidence of mitral valve stenosis. Tricuspid Valve: The tricuspid valve is normal in structure. Tricuspid valve regurgitation is not demonstrated. No evidence of tricuspid stenosis. Aortic Valve: The aortic valve is tricuspid. Aortic valve regurgitation is not visualized. No aortic stenosis is present. Aortic valve mean gradient measures 4.7 mmHg. Aortic valve peak gradient measures 9.2 mmHg. Aortic valve area, by VTI measures 2.51 cm. Pulmonic Valve: The pulmonic valve was not well visualized. Pulmonic valve regurgitation is not visualized. No evidence of pulmonic stenosis. Aorta: The aortic root is normal in size and structure. Venous: The inferior vena cava is normal in size with greater than 50% respiratory variability, suggesting right atrial pressure of 3 mmHg. IAS/Shunts: No atrial level shunt detected by color flow Doppler.  LEFT VENTRICLE PLAX 2D LVIDd:         4.40 cm   Diastology LVIDs:         2.70 cm   LV e' medial:    10.20 cm/s LV PW:         0.70 cm   LV E/e' medial:  10.6 LV IVS:        0.80 cm   LV e' lateral:   11.60 cm/s LVOT diam:     2.00 cm   LV E/e' lateral: 9.3 LV SV:         85 LV SV Index:   40 LVOT Area:     3.14 cm  RIGHT VENTRICLE RV S prime:  10.80 cm/s TAPSE (M-mode): 2.7 cm LEFT  ATRIUM             Index        RIGHT ATRIUM           Index LA diam:        3.80 cm 1.79 cm/m   RA Area:     13.30 cm LA Vol (A2C):   43.2 ml 20.39 ml/m  RA Volume:   27.60 ml  13.03 ml/m LA Vol (A4C):   39.1 ml 18.45 ml/m LA Biplane Vol: 44.5 ml 21.00 ml/m  AORTIC VALVE AV Area (Vmax):    2.36 cm AV Area (Vmean):   2.29 cm AV Area (VTI):     2.51 cm AV Vmax:           152.03 cm/s AV Vmean:          102.092 cm/s AV VTI:            0.338 m AV Peak Grad:      9.2 mmHg AV Mean Grad:      4.7 mmHg LVOT Vmax:         114.00 cm/s LVOT Vmean:        74.300 cm/s LVOT VTI:          0.270 m LVOT/AV VTI ratio: 0.80  AORTA Ao Root diam: 3.10 cm MITRAL VALVE MV Area (PHT): 3.91 cm     SHUNTS MV Decel Time: 194 msec     Systemic VTI:  0.27 m MV E velocity: 108.00 cm/s  Systemic Diam: 2.00 cm MV A velocity: 48.80 cm/s MV E/A ratio:  2.21 Carlyle Dolly MD Electronically signed by Carlyle Dolly MD Signature Date/Time: 01/18/2021/2:04:53 PM    Final    XR Foot 2 Views Left  Result Date: 01/28/2021 PIP and DIP narrowing was noted.  No MTP, intertarsal, tibiotalar or subtalar joint space narrowing was noted.  No erosive changes were noted. Impression: These findings are consistent with osteoarthritis of the foot.  XR Foot 2 Views Right  Result Date: 01/28/2021 PIP and DIP narrowing was noted.  No MTP, intertarsal, tibiotalar or subtalar joint space narrowing was noted.  No erosive changes were noted. Impression: These findings are consistent with osteoarthritis of the foot.  XR Hand 2 View Left  Result Date: 01/28/2021 [facility] and PIP narrowing was noted.  No MCP, intercarpal or radiocarpal joint space narrowing was noted.  No erosive changes noted. Impression: These findings are consistent with early osteoarthritis of the hand.  XR Hand 2 View Right  Result Date: 01/28/2021 [facility] and PIP narrowing was noted.  No MCP, intercarpal or radiocarpal joint space narrowing was noted.  No erosive changes noted.  Impression: These findings are consistent with early osteoarthritis of the hand.  XR KNEE 3 VIEW LEFT  Result Date: 01/28/2021 Moderate medial compartment narrowing was noted.  Moderate patellofemoral narrowing was noted.  No chondrocalcinosis was noted. Impression: These findings are consistent with moderate osteoarthritis and moderate chondromalacia patella.  XR KNEE 3 VIEW RIGHT  Result Date: 01/28/2021 Moderate medial compartment narrowing was noted.  Moderate patellofemoral narrowing was noted.  No chondrocalcinosis was noted. Impression: These findings are consistent with moderate osteoarthritis and moderate chondromalacia patella.   Recent Labs: Lab Results  Component Value Date   WBC 5.8 11/30/2020   HGB 12.5 11/30/2020   PLT 220 11/30/2020   NA 140 11/30/2020   K 4.1 11/30/2020   CL 102 11/30/2020   CO2 21 11/30/2020  GLUCOSE 91 11/30/2020   BUN 23 11/30/2020   CREATININE 1.07 (H) 11/30/2020   BILITOT 0.3 11/30/2020   ALKPHOS 71 11/30/2020   AST 15 11/30/2020   ALT 13 11/30/2020   PROT 6.4 11/30/2020   ALBUMIN 4.0 11/30/2020   CALCIUM 8.9 11/30/2020   November 30, 2020 ANA negative, ESR 34, CRP 19   Speciality Comments: No specialty comments available.  Procedures:  No procedures performed Allergies: Keflex [cephalexin]   Assessment / Plan:     Visit Diagnoses: Pain in both hands -she complains of pain and discomfort in her bilateral hands and intermittent swelling.  No synovitis was noted.  Some prominence of PIP and DIP joints was noted.  Plan: XR Hand 2 View Right, XR Hand 2 View Left,.  CMC and PIP narrowing consistent with early osteoarthritis was noted.  Sedimentation rate, Rheumatoid factor, Cyclic citrul peptide antibody, IgG  Bilateral hand numbness-she complains of numbness in her bilateral hands.  I will refer her to neurology for evaluation.  Chronic pain of both knees -she complains of pain and discomfort in her bilateral knee joints.  No warmth  swelling or effusion was noted.  Plan: XR KNEE 3 VIEW RIGHT, XR KNEE 3 VIEW LEFT.  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  No chondrocalcinosis was noted.  Pain in both feet -she complains of discomfort and swelling in her bilateral feet.  No synovitis was noted.  Bilateral first MTP thickening was noted.  Plan: XR Foot 2 Views Right, XR Foot 2 Views Left.  2 osteoarthritic changes were noted in PIP and DIP joints.  Numbness in feet-she complains of numbness in her bilateral feet.  I will refer her to neurology.  DDD (degenerative disc disease), lumbar-patient states she was diagnosed with degenerative disc disease and spinal stenosis.  She is going to physical therapy.  Myalgia -she complains of generalized pain, hyperalgesia.  She had multiple tender points.  There is family history of fibromyalgia in both parents.  She also gives history of chronic fatigue and insomnia.  I believe she has underlying fibromyalgia.  Plan: CK, TSH  Other fatigue-chronic fatigue  Other insomnia-good sleep hygiene was discussed.  Vitamin D deficiency-she is on vitamin D supplement.  History of gastroesophageal reflux (GERD)  History of anxiety  Onychomycosis  Former smoker - Half a pack per day for 15 years.  She quit smoking in 05/2020  Family history of fibromyalgia - Both parents  Orders: Orders Placed This Encounter  Procedures   XR Hand 2 View Right   XR Hand 2 View Left   XR KNEE 3 VIEW RIGHT   XR KNEE 3 VIEW LEFT   XR Foot 2 Views Right   XR Foot 2 Views Left   CK   TSH   Sedimentation rate   Rheumatoid factor   Cyclic citrul peptide antibody, IgG   Ambulatory referral to Neurology   No orders of the defined types were placed in this encounter.    Follow-Up Instructions: Return for Arthralgias, myalgias.   Bo Merino, MD  Note - This record has been created using Editor, commissioning.  Chart creation errors have been sought, but may  not always  have been located. Such creation errors do not reflect on  the standard of medical care.

## 2021-01-18 ENCOUNTER — Other Ambulatory Visit: Payer: Self-pay

## 2021-01-18 ENCOUNTER — Ambulatory Visit (HOSPITAL_COMMUNITY)
Admission: RE | Admit: 2021-01-18 | Discharge: 2021-01-18 | Disposition: A | Discharge status: Home / Self Care | Payer: Medicaid Other | Source: Ambulatory Visit | Attending: Cardiovascular Disease | Admitting: Cardiovascular Disease

## 2021-01-18 DIAGNOSIS — R002 Palpitations: Secondary | ICD-10-CM | POA: Diagnosis present

## 2021-01-18 LAB — ECHOCARDIOGRAM COMPLETE
AR max vel: 2.36 cm2
AV Area VTI: 2.51 cm2
AV Area mean vel: 2.29 cm2
AV Mean grad: 4.7 mmHg
AV Peak grad: 9.2 mmHg
Ao pk vel: 1.52 m/s
Area-P 1/2: 3.91 cm2
S' Lateral: 2.7 cm

## 2021-01-18 NOTE — Progress Notes (Signed)
*  PRELIMINARY RESULTS* Echocardiogram 2D Echocardiogram has been performed.  Selena Howard 01/18/2021, 9:24 AM

## 2021-01-19 ENCOUNTER — Encounter: Payer: Self-pay | Admitting: Family Medicine

## 2021-01-19 ENCOUNTER — Encounter (HOSPITAL_COMMUNITY): Payer: Self-pay

## 2021-01-19 ENCOUNTER — Ambulatory Visit (HOSPITAL_COMMUNITY): Payer: Medicaid Other

## 2021-01-19 ENCOUNTER — Ambulatory Visit: Payer: Medicaid Other | Admitting: Family Medicine

## 2021-01-19 VITALS — BP 124/86 | HR 72 | Temp 98.1°F | Ht 63.0 in | Wt 256.6 lb

## 2021-01-19 DIAGNOSIS — R635 Abnormal weight gain: Secondary | ICD-10-CM | POA: Diagnosis not present

## 2021-01-19 DIAGNOSIS — R700 Elevated erythrocyte sedimentation rate: Secondary | ICD-10-CM

## 2021-01-19 DIAGNOSIS — M545 Low back pain, unspecified: Secondary | ICD-10-CM

## 2021-01-19 DIAGNOSIS — G479 Sleep disorder, unspecified: Secondary | ICD-10-CM

## 2021-01-19 DIAGNOSIS — R7982 Elevated C-reactive protein (CRP): Secondary | ICD-10-CM

## 2021-01-19 DIAGNOSIS — R002 Palpitations: Secondary | ICD-10-CM

## 2021-01-19 DIAGNOSIS — G8929 Other chronic pain: Secondary | ICD-10-CM

## 2021-01-19 DIAGNOSIS — G4719 Other hypersomnia: Secondary | ICD-10-CM

## 2021-01-19 DIAGNOSIS — M6281 Muscle weakness (generalized): Secondary | ICD-10-CM

## 2021-01-19 NOTE — Therapy (Signed)
[place], [place], [address] Phone: [phone]   Fax:  [phone]  Physical Therapy Treatment  Patient Details  Name: Selena Howard MRN: [id number] Date of Birth: Aug 19, 1975 Referring Provider (PT): Dr. Rodell Perna   Encounter Date: 01/19/2021   PT End of Session - 01/19/21 1056     Visit Number 3    Number of Visits 6    Date for PT Re-Evaluation 02/09/21    Authorization Type Medicaid [employer], auth form attached    PT Start Time 1050    PT Stop Time 1130    PT Time Calculation (min) 40 min    Activity Tolerance Patient tolerated treatment well;No increased pain    Behavior During Therapy [facility] for tasks assessed/performed             Past Medical History:  Diagnosis Date   Anxiety    Arthritis    Endometriosis    now s/p partial hysterectomy 06/2020   Spinal stenosis at L4-L5 level     Past Surgical History:  Procedure Laterality Date   ABDOMINAL HYSTERECTOMY     partial, ovary sparing   CESAREAN SECTION     WISDOM TOOTH EXTRACTION Bilateral     There were no vitals filed for this visit.   Subjective Assessment - 01/19/21 1052     Subjective Pt reports she feels like her knees are more swollen than normal, still has joint pain and pains throughout the body that are good some days and bad others. Pt reports poor sleep, interrupted by pain or difficulty falling asleep.    Currently in Pain? Yes    Pain Score 6     Pain Location --   wrists, lower back, hips   Pain Descriptors / Indicators Aching;Sore    Pain Type Chronic pain                   [facility] Adult PT Treatment/Exercise - 01/19/21 0001       Lumbar Exercises: Supine   Ab Set 20 reps;3 seconds    AB Set Limitations with exhale    Pelvic Tilt 20 reps;5 seconds    Pelvic Tilt Limitations anterior and posterior, paired with inhale/exhale to avoid breath holding    Heel Slides 15 reps    Heel Slides Limitations  hooklying position with ab set, exhale    Bent Knee Raise 15 reps    Bent Knee Raise Limitations hooklying position with ab set, exhale    Dead Bug 10 reps;5 seconds    Dead Bug Limitations isometric hold with knees at 90/90    Bridge 20 reps    Other Supine Lumbar Exercises dead bug isometric pose, alternating LE kick out, 5 reps x3 sets      Lumbar Exercises: Sidelying   Clam 10 reps;Both    Hip Abduction 10 reps;Both      Lumbar Exercises: Quadruped   Single Arm Raise 5 reps    Straight Leg Raise 5 reps                     PT Education - 01/19/21 1056     Education Details Continue HEP, exercise technique    Person(s) Educated Patient    Methods Explanation;Demonstration    Comprehension Verbalized understanding              PT Short Term Goals - 01/12/21 1125       PT SHORT TERM GOAL #  1   Title Patient will be independent with HEP in order to improve functional outcomes.    Time 3    Period Weeks    Status On-going    Target Date 01/19/21      PT SHORT TERM GOAL #2   Title Identify movement patter that provides centralization of LE symptoms    Baseline unknown if flexion or extension increase symptoms    Time 3    Period Weeks    Status On-going    Target Date 01/19/21               PT Long Term Goals - Feb 05, 2021 1125       PT LONG TERM GOAL #1   Title Increase trunk strength to 4/5 to improve lumbar stability to reduce pain    Baseline 3/5 flexion, 3-/5 extension    Time 6    Period Weeks    Status On-going    Target Date 02/09/21      PT LONG TERM GOAL #2   Title Demo increased lumbar extension ROM to 25% limited to improve body mechanics    Baseline 50% limited    Time 6    Period Weeks    Status On-going    Target Date 02/09/21                   Plan - 01/19/21 1057     Clinical Impression Statement Session focused on core and hip strengthening to improve mobility and reduce pain. Increased reps with isometric  strengthening and added concentric and eccentric core activation for added strengthening. Added pelvic tilts in supine for ROM and core activation, tactile and verbal cues used as needed. Trialed quadruped with some difficulty due to core weakness with noted hip dumping both directions and limited by bil wrist pain in quadruped positioning. Added hip strengthening in sidelying with good core activation able to hold pelvis in sidelying position without rotation. Continue to progress as able.    Personal Factors and Comorbidities Time since onset of injury/illness/exacerbation;Fitness    Examination-Activity Limitations Squat;Stand;Lift;Carry;Bend;Locomotion Level;Sit    Examination-Participation Restrictions Cleaning;Community Activity;Driving;Yard Work;Shop;Meal Prep    Stability/Clinical Decision Making Stable/Uncomplicated    Rehab Potential Fair    PT Frequency 1x / week    PT Duration 6 weeks    PT Treatment/Interventions ADLs/Self Care Home Management;Electrical Stimulation;Traction;Gait training;Functional mobility training;Therapeutic activities;Therapeutic exercise;Balance training;Neuromuscular re-education;Manual techniques;Dry needling;Spinal Manipulations;Joint Manipulations    PT Next Visit Plan Continue core and glute strengthening; quadruped positioning with forearms supported for wrist comfort. f/u with PCP and rheumatologist appointments    PT Home Exercise Plan prone, prone on elbows; 02/05/2022: ab set, dead bug isometric hold, bridge    Consulted and Agree with Plan of Care Patient             Patient will benefit from skilled therapeutic intervention in order to improve the following deficits and impairments:  Decreased range of motion, Decreased strength, Impaired perceived functional ability, Pain, Obesity  Visit Diagnosis: Chronic bilateral low back pain, unspecified whether sciatica present  Muscle weakness (generalized)     Problem List Patient Active Problem List    Diagnosis Date Noted   Bilateral hand numbness 12/16/2020   Other intervertebral disc degeneration, lumbar region 12/16/2020    Talbot Grumbling PT, DPT 01/19/21, 11:34 AM    [place], [place], [address] Phone: [phone]   Fax:  [phone]  Name: Selena Howard  MRN: [id number] Date of Birth: 06-Mar-1975

## 2021-01-19 NOTE — Patient Instructions (Addendum)
Max dose Melatonin 10mg  per night. Would start with 3mg .

## 2021-01-19 NOTE — Progress Notes (Signed)
Subjective: CC: Follow-up weight, heart palpitations PCP: Selena Norlander, DO [name] is a 46 y.o. female presenting to clinic today for:  1.  Morbid obesity Patient with ongoing weight gain despite having changed nothing about her diet or mobility.  She unfortunately could not get Saxenda covered by her insurance.  She is willing to go to healthy weight and wellness and asked for that referral today  2.  Heart palpitations Patient continues to have heart palpitations and sinus tachycardia which occurs at rest.  She had an echocardiogram that was unremarkable.  Has not been reached to schedule next appointment for consideration of Holter monitor but she is still interested in this.  3.  Polyarthralgia She will be seeing rheumatology next week as well as orthopedics.  Continues to have right-sided knee stiffness and pain as well as stiffness and pain in her hand, particularly the right pinky.  4.  Sleep difficulties Patient has excessive daytime sleepiness but has extreme difficulty maintaining sleep at bedtime.  She never feels like she actually gets into a deep sleep and high just lingers in between consciousness and unconsciousness.  When she does finally fall asleep and this is usually only after several days of sleep deprivation she has very vivid dreams.   ROS: Per HPI  Allergies  Allergen Reactions   Keflex [Cephalexin] Nausea And Vomiting   Past Medical History:  Diagnosis Date   Anxiety    Arthritis    Endometriosis    now s/p partial hysterectomy 06/2020   Spinal stenosis at L4-L5 level     Current Outpatient Medications:    betamethasone valerate (VALISONE) 0.1 % cream, Apply topically 2 (two) times daily. X10-14 days to foot, Disp: 30 g, Rfl: 1   Cholecalciferol (VITAMIN D3) 1.25 MG (50000 UT) CAPS, Take 1 capsule by mouth once a week., Disp: , Rfl:    cholecalciferol (VITAMIN D3) 25 MCG (1000 UNIT) tablet, Take by mouth daily., Disp: , Rfl:     ciclopirox (PENLAC) 8 % solution, Apply topically daily., Disp: , Rfl:    esomeprazole (NEXIUM) 40 MG capsule, Take 1 capsule (40 mg total) by mouth daily., Disp: 30 capsule, Rfl: 3   FLUoxetine (PROZAC) 20 MG capsule, Take 20 mg by mouth daily., Disp: , Rfl:    gabapentin (NEURONTIN) 300 MG capsule, Take 2 capsules (600 mg total) by mouth 3 (three) times daily., Disp: 180 capsule, Rfl: 2   ibuprofen (ADVIL) 800 MG tablet, Take 800 mg by mouth every 8 (eight) hours as needed., Disp: , Rfl:    Insulin Pen Needle 32G X 6 MM MISC, UAD with saxenda, Disp: 100 each, Rfl: 3   traMADol (ULTRAM) 50 MG tablet, Take 1 tablet (50 mg total) by mouth every 12 (twelve) hours as needed for severe pain. Put on file, Disp: 30 tablet, Rfl: 0 Social History   Socioeconomic History   Marital status: Divorced    Spouse name: Not on file   Number of children: 3   Years of education: Not on file   Highest education level: Not on file  Occupational History   Not on file  Tobacco Use   Smoking status: Former    Packs/day: 0.50    Years: 10.00    Pack years: 5.00    Types: Cigarettes    Quit date: 06/02/2020    Years since quitting: 0.6   Smokeless tobacco: Never  Vaping Use   Vaping Use: Never used  Substance and Sexual Activity  Alcohol use: Not Currently   Drug use: Not Currently   Sexual activity: Not Currently  Other Topics Concern   Not on file  Social History Narrative   Mother also sees me, Selena Howard   Social Determinants of Health   Financial Resource Strain: Not on file  Food Insecurity: Not on file  Transportation Needs: Not on file  Physical Activity: Not on file  Stress: Not on file  Social Connections: Not on file  Intimate Partner Violence: Not on file   Family History  Problem Relation Age of Onset   Diabetes Mother    Liver disease Mother    COPD Father    Diabetes Father    Heart disease Maternal Grandmother    Cancer Maternal Grandfather    Cancer Paternal  Grandmother    Diabetes Paternal Grandfather     Objective: Office vital signs reviewed. BP 124/86    Pulse 72    Temp 98.1 F (36.7 C)    Ht 5\' 3"  (1.6 m)    Wt 256 lb 9.6 oz (116.4 kg)    SpO2 99%    BMI 45.45 kg/m   Physical Examination:  General: Awake, alert, morbidly obese, No acute distress HEENT: Sclera white Cardio: regular rate and rhythm  Pulm: Normal work of breathing on room air MSK: Ambulating independently.  She does have some joints swelling and stiffness noted at the right knee.  Her right pinky did not appear to be especially warm or erythematous but did have some stiffness noted  Depression screen Selena Howard 2/9 01/19/2021 12/01/2020 10/20/2020  Decreased Interest 1 0 0  Down, Depressed, Hopeless 0 0 0  PHQ - 2 Score 1 0 0  Altered sleeping 3 3 3   Tired, decreased energy 3 2 1   Change in appetite 1 2 0  Feeling bad or failure about yourself  0 0 0  Trouble concentrating 0 0 1  Moving slowly or fidgety/restless 0 0 0  Suicidal thoughts 0 0 0  PHQ-9 Score 8 7 5   Difficult doing work/chores Not difficult at all Somewhat difficult Not difficult at all   GAD 7 : Generalized Anxiety Score 01/19/2021 12/01/2020 10/20/2020  Nervous, Anxious, on Edge 1 1 0  Control/stop worrying 0 0 0  Worry too much - different things 0 0 0  Trouble relaxing 1 1 0  Restless 0 1 0  Easily annoyed or irritable 0 0 0  Afraid - awful might happen 0 0 0  Total GAD 7 Score 2 3 0  Anxiety Difficulty Somewhat difficult Somewhat difficult Not difficult at all   Assessment/ Plan: 46 y.o. female   Elevated sed rate  Elevated C-reactive protein (CRP)  Morbid obesity (Selena Howard) - Plan: Ambulatory referral to Sleep Studies, Amb Ref to Medical Weight Management  Weight gain, abnormal - Plan: Ambulatory referral to Sleep Studies, Amb Ref to Medical Weight Management  Excessive daytime sleepiness - Plan: Ambulatory referral to Sleep Studies, Amb Ref to Medical Weight Management  Sleep disorder -  Plan: Ambulatory referral to Sleep Studies  Heart palpitations  She will be seeing rheumatology next week for the elevated sed rate, CRP and polyarthralgia.  The remainder of her autoimmune work-up here was relatively unremarkable  Ongoing morbid obesity with ongoing weight gain.  Back in November she was 248 pounds.  Today she is up to 256 pounds.  Could not find a metabolic etiology of this.  I have referred her to healthy weight and wellness and information was given  today.  Referral to sleep studies to evaluate for possible obstructive sleep apnea and or other sleep disorder that may be contributing to the after mentioned issues.  Okay to use melatonin with max dose to 10 mg.  We discussed starting with somewhere around 3 mg at bedtime  Uncertain etiology of her ongoing heart palpitations.  Her anxiety score is relatively low.  She had an echocardiogram which did not demonstrate any significant findings.  I wonder if she is going to be getting a Holter monitor at some point?  We will CC Dr. Johnsie Cancel for his thoughts  No orders of the defined types were placed in this encounter.  No orders of the defined types were placed in this encounter.    Selena Norlander, DO [place] [phone]

## 2021-01-26 ENCOUNTER — Ambulatory Visit (HOSPITAL_COMMUNITY): Payer: Medicaid Other

## 2021-01-27 ENCOUNTER — Other Ambulatory Visit: Payer: Self-pay

## 2021-01-27 ENCOUNTER — Encounter: Payer: Self-pay | Admitting: Orthopaedic Surgery

## 2021-01-27 ENCOUNTER — Ambulatory Visit (INDEPENDENT_AMBULATORY_CARE_PROVIDER_SITE_OTHER): Payer: Medicaid Other | Admitting: Orthopaedic Surgery

## 2021-01-27 VITALS — Ht 63.0 in | Wt 251.0 lb

## 2021-01-27 DIAGNOSIS — M5136 Other intervertebral disc degeneration, lumbar region: Secondary | ICD-10-CM

## 2021-01-27 NOTE — Progress Notes (Signed)
Office Visit Note   Patient: Selena Howard           Date of Birth: 02-Jul-1975           MRN: [id number] Visit Date: 01/27/2021              Requested by: [name], DO [place],  [place] [address] PCP: [name], DO   Assessment & Plan: Visit Diagnoses:  1. Other intervertebral disc degeneration, lumbar region     Plan: Patient will proceed with a rheumatology appointment and work-up.  Next option would be repeat lumbar MRI scan. She had some previous images done in [place] greater than a year ago.  She can call us in a few weeks if she is having persistent symptoms and rheumatologic work-up is negative. Follow-Up Instructions: No follow-ups on file.   Orders:  No orders of the defined types were placed in this encounter.  No orders of the defined types were placed in this encounter.     Procedures: No procedures performed   Clinical Data: No additional findings.   Subjective: Chief Complaint  Patient presents with   Lower Back - Follow-up, Pain    HPI 46 year old female returns with ongoing problems with hand numbness but also low back pain.  She went to therapy states she got minimal relief with the therapy.  Her pain tends to wax and wane with more back pain.  She is on gabapentin 600 mg 3 times daily and uses the tramadol sparingly.  She states she is concerned about taking more pain medication and understands that could become a problem.  Previous imaging showed some facet hypertrophy at L3-4 with mild stenosis and some foraminal slight narrowing with facet hypertrophy at L5-S1 worse on the left than right but no definite nerve compression.  She uses splints in her hands but still tends to wake up.  She states her PCP is sending her to a rheumatologist when she had slight elevation of C-reactive protein and sedimentation rate.  ANA was negative.  Review of Systems all other systems noncontributory to HPI.   Objective: Vital  Signs: Ht 5\' 3"  (1.6 m)    Wt 251 lb (113.9 kg)    BMI 44.46 kg/m   Physical Exam Constitutional:      Appearance: She is well-developed.  HENT:     Head: Normocephalic.     Right Ear: External ear normal.     Left Ear: External ear normal. There is no impacted cerumen.  Eyes:     Pupils: Pupils are equal, round, and reactive to light.  Neck:     Thyroid: No thyromegaly.     Trachea: No tracheal deviation.  Cardiovascular:     Rate and Rhythm: Normal rate.  Pulmonary:     Effort: Pulmonary effort is normal.  Abdominal:     Palpations: Abdomen is soft.  Musculoskeletal:     Cervical back: No rigidity.  Skin:    General: Skin is warm and dry.  Neurological:     Mental Status: She is alert and oriented to person, place, and time.  Psychiatric:        Behavior: Behavior normal.    Ortho Exam no thenar atrophy.  She has some tenderness over the SI joint some over the sciatic notch.  Negative straight leg raising 90 degrees.  Negative logroll right and left hip.  Specialty Comments:  No specialty comments available.  Imaging: No results found.   [place]  History: Patient Active Problem List   Diagnosis Date Noted   Bilateral hand numbness 12/16/2020   Other intervertebral disc degeneration, lumbar region 12/16/2020   Past Medical History:  Diagnosis Date   Anxiety    Arthritis    Endometriosis    now s/p partial hysterectomy 06/2020   Spinal stenosis at L4-L5 level     Family History  Problem Relation Age of Onset   Diabetes Mother    Liver disease Mother    COPD Father    Diabetes Father    Heart disease Maternal Grandmother    Cancer Maternal Grandfather    Cancer Paternal Grandmother    Diabetes Paternal Grandfather     Past Surgical History:  Procedure Laterality Date   ABDOMINAL HYSTERECTOMY     partial, ovary sparing   CESAREAN SECTION     WISDOM TOOTH EXTRACTION Bilateral    Social History   Occupational History   Not on file  Tobacco Use    Smoking status: Former    Packs/day: 0.50    Years: 10.00    Pack years: 5.00    Types: Cigarettes    Quit date: 06/02/2020    Years since quitting: 0.6   Smokeless tobacco: Never  Vaping Use   Vaping Use: Never used  Substance and Sexual Activity   Alcohol use: Not Currently   Drug use: Not Currently   Sexual activity: Not Currently

## 2021-01-28 ENCOUNTER — Ambulatory Visit: Payer: Self-pay

## 2021-01-28 ENCOUNTER — Encounter: Payer: Self-pay | Admitting: Rheumatology

## 2021-01-28 ENCOUNTER — Other Ambulatory Visit: Payer: Self-pay

## 2021-01-28 ENCOUNTER — Ambulatory Visit: Payer: Medicaid Other | Admitting: Rheumatology

## 2021-01-28 VITALS — Ht 63.0 in | Wt 250.0 lb

## 2021-01-28 DIAGNOSIS — M5136 Other intervertebral disc degeneration, lumbar region: Secondary | ICD-10-CM

## 2021-01-28 DIAGNOSIS — M79641 Pain in right hand: Secondary | ICD-10-CM | POA: Diagnosis not present

## 2021-01-28 DIAGNOSIS — M79672 Pain in left foot: Secondary | ICD-10-CM

## 2021-01-28 DIAGNOSIS — Z8269 Family history of other diseases of the musculoskeletal system and connective tissue: Secondary | ICD-10-CM

## 2021-01-28 DIAGNOSIS — M791 Myalgia, unspecified site: Secondary | ICD-10-CM

## 2021-01-28 DIAGNOSIS — G8929 Other chronic pain: Secondary | ICD-10-CM | POA: Diagnosis not present

## 2021-01-28 DIAGNOSIS — G4709 Other insomnia: Secondary | ICD-10-CM

## 2021-01-28 DIAGNOSIS — M25562 Pain in left knee: Secondary | ICD-10-CM

## 2021-01-28 DIAGNOSIS — M25561 Pain in right knee: Secondary | ICD-10-CM | POA: Diagnosis not present

## 2021-01-28 DIAGNOSIS — R200 Anesthesia of skin: Secondary | ICD-10-CM

## 2021-01-28 DIAGNOSIS — R5383 Other fatigue: Secondary | ICD-10-CM

## 2021-01-28 DIAGNOSIS — Z8719 Personal history of other diseases of the digestive system: Secondary | ICD-10-CM | POA: Insufficient documentation

## 2021-01-28 DIAGNOSIS — Z8659 Personal history of other mental and behavioral disorders: Secondary | ICD-10-CM

## 2021-01-28 DIAGNOSIS — M79671 Pain in right foot: Secondary | ICD-10-CM | POA: Diagnosis not present

## 2021-01-28 DIAGNOSIS — Z87891 Personal history of nicotine dependence: Secondary | ICD-10-CM

## 2021-01-28 DIAGNOSIS — E559 Vitamin D deficiency, unspecified: Secondary | ICD-10-CM

## 2021-01-28 DIAGNOSIS — B351 Tinea unguium: Secondary | ICD-10-CM

## 2021-01-28 DIAGNOSIS — M79642 Pain in left hand: Secondary | ICD-10-CM | POA: Diagnosis not present

## 2021-01-31 LAB — SEDIMENTATION RATE: Sed Rate: 31 mm/h — ABNORMAL HIGH (ref 0–20)

## 2021-01-31 LAB — TSH: TSH: 2.24 mIU/L

## 2021-01-31 LAB — RHEUMATOID FACTOR: Rheumatoid fact SerPl-aCnc: <14 IU/mL (ref <14)

## 2021-01-31 LAB — CYCLIC CITRUL PEPTIDE ANTIBODY, IGG: Cyclic Citrullin Peptide Ab: <16 UNITS

## 2021-01-31 LAB — CK: Total CK: 59 U/L (ref 29–143)

## 2021-02-02 ENCOUNTER — Other Ambulatory Visit: Payer: Self-pay

## 2021-02-02 ENCOUNTER — Ambulatory Visit (HOSPITAL_COMMUNITY): Payer: Medicaid Other | Attending: Orthopaedic Surgery | Admitting: Physical Therapy

## 2021-02-02 ENCOUNTER — Encounter (HOSPITAL_COMMUNITY): Payer: Self-pay | Admitting: Physical Therapy

## 2021-02-02 DIAGNOSIS — M545 Low back pain, unspecified: Secondary | ICD-10-CM | POA: Insufficient documentation

## 2021-02-02 DIAGNOSIS — G8929 Other chronic pain: Secondary | ICD-10-CM | POA: Diagnosis present

## 2021-02-02 DIAGNOSIS — M6281 Muscle weakness (generalized): Secondary | ICD-10-CM | POA: Insufficient documentation

## 2021-02-02 NOTE — Patient Instructions (Signed)
Access Code: [email or web address] URL: [email or web address] Date: 02/02/2021 Prepared by: Josue Hector  Exercises Clamshell - 2 x daily - 7 x weekly - 2 sets - 10 reps - 3 second hold Supine Lower Trunk Rotation - 2 x daily - 7 x weekly - 1 sets - 5 reps - 10-15 second hold Beginner Front Arm Support - 2 x daily - 7 x weekly - 2 sets - 10 reps - 3 second hold

## 2021-02-02 NOTE — Therapy (Signed)
[facility] [address] [place], [place], [address] Phone: [phone]   Fax:  [phone]  Physical Therapy Treatment  Patient Details  Name: Selena Howard MRN: [id number] Date of Birth: 1975-10-27 Referring Provider (PT): Dr. Rodell Perna   Encounter Date: 02/02/2021   PT End of Session - 02/02/21 1046     Visit Number 4    Number of Visits 6    Date for PT Re-Evaluation 02/09/21    Authorization Type Medicaid [employer]    Authorization Time Period 6 visits 1/3-02/09/21    PT Start Time 1031    PT Stop Time 1113    PT Time Calculation (min) 42 min    Activity Tolerance Patient tolerated treatment well    Behavior During Therapy [facility] for tasks assessed/performed             Past Medical History:  Diagnosis Date   Anxiety    Arthritis    Endometriosis    now s/p partial hysterectomy 06/2020   Spinal stenosis at L4-L5 level     Past Surgical History:  Procedure Laterality Date   ABDOMINAL HYSTERECTOMY     partial, ovary sparing   CESAREAN SECTION     WISDOM TOOTH EXTRACTION Bilateral     There were no vitals filed for this visit.   Subjective Assessment - 02/02/21 1034     Subjective "About the same" no better no worse. " I hurt everywhere". Had recent visit with rheumatology. Unsure of results but did have x-rays of feet and knees which showed arthritis.    Currently in Pain? Yes    Pain Score 7     Pain Location Back   back mostly, but global in general   Pain Type Chronic pain                               [facility] Adult PT Treatment/Exercise - 02/02/21 0001       Lumbar Exercises: Stretches   Lower Trunk Rotation 5 reps;10 seconds      Lumbar Exercises: Supine   Ab Set 10 reps;5 seconds    Bent Knee Raise 20 reps    Dead Bug 20 reps   2 sets of 10   Bridge 20 reps    Straight Leg Raise 20 reps   2 x 10 each with ab set   Other Supine Lumbar Exercises dead bug isometric pose x10       Lumbar Exercises: Sidelying   Clam 20 reps      Lumbar Exercises: Quadruped   Single Arm Raise --    Straight Leg Raise 20 reps   2 sets                      PT Short Term Goals - 01/12/21 1125       PT SHORT TERM GOAL #1   Title Patient will be independent with HEP in order to improve functional outcomes.    Time 3    Period Weeks    Status On-going    Target Date 01/19/21      PT SHORT TERM GOAL #2   Title Identify movement patter that provides centralization of LE symptoms    Baseline unknown if flexion or extension increase symptoms    Time 3    Period Weeks    Status On-going    Target Date 01/19/21  PT Long Term Goals - February 05, 2021 1125       PT LONG TERM GOAL #1   Title Increase trunk strength to 4/5 to improve lumbar stability to reduce pain    Baseline 3/5 flexion, 3-/5 extension    Time 6    Period Weeks    Status On-going    Target Date 02/09/21      PT LONG TERM GOAL #2   Title Demo increased lumbar extension ROM to 25% limited to improve body mechanics    Baseline 50% limited    Time 6    Period Weeks    Status On-going    Target Date 02/09/21                   Plan - 02/02/21 1107     Clinical Impression Statement Patient tolerated session well today with no increased complaint of pain. Good return with previous ther ex. Slightly challenged with quadruped activity due to ongoing core weakness. Patient educated on proper form and function of all added exercise. Cued for maintaining level hips during quadruped hip extension. Patient will continue to benefit from skilled therapy services to reduce deficits and improve functional level.    Personal Factors and Comorbidities Time since onset of injury/illness/exacerbation;Fitness    Examination-Activity Limitations Squat;Stand;Lift;Carry;Bend;Locomotion Level;Sit    Examination-Participation Restrictions Cleaning;Community Activity;Driving;Yard Work;Shop;Meal Prep     Stability/Clinical Decision Making Stable/Uncomplicated    Rehab Potential Fair    PT Frequency 1x / week    PT Duration 6 weeks    PT Treatment/Interventions ADLs/Self Care Home Management;Electrical Stimulation;Traction;Gait training;Functional mobility training;Therapeutic activities;Therapeutic exercise;Balance training;Neuromuscular re-education;Manual techniques;Dry needling;Spinal Manipulations;Joint Manipulations    PT Next Visit Plan Reassess next visit    PT Home Exercise Plan prone, prone on elbows; 02-05-2022: ab set, dead bug isometric hold, bridge 21/ clam, quadruped hip extension, LTR    Consulted and Agree with Plan of Care Patient             Patient will benefit from skilled therapeutic intervention in order to improve the following deficits and impairments:  Decreased range of motion, Decreased strength, Impaired perceived functional ability, Pain, Obesity  Visit Diagnosis: Chronic bilateral low back pain, unspecified whether sciatica present  Muscle weakness (generalized)     Problem List Patient Active Problem List   Diagnosis Date Noted   History of gastroesophageal reflux (GERD) 01/28/2021   Vitamin D deficiency 01/28/2021   History of anxiety 01/28/2021   Bilateral hand numbness 12/16/2020   Other intervertebral disc degeneration, lumbar region 12/16/2020   11:12 AM, 02/02/21 Josue Hector PT DPT  Physical Therapist with [facility]  [phone] [place], [place], [address] Phone: [phone]   Fax:  [phone]  Name: Selena Howard MRN: [id number] Date of Birth: Mar 13, 1975

## 2021-02-04 ENCOUNTER — Encounter: Payer: Self-pay | Admitting: Neurology

## 2021-02-04 ENCOUNTER — Ambulatory Visit: Payer: Medicaid Other | Admitting: Neurology

## 2021-02-04 VITALS — BP 105/70 | HR 79 | Ht 63.0 in | Wt 260.0 lb

## 2021-02-04 DIAGNOSIS — R202 Paresthesia of skin: Secondary | ICD-10-CM

## 2021-02-04 DIAGNOSIS — G4733 Obstructive sleep apnea (adult) (pediatric): Secondary | ICD-10-CM

## 2021-02-04 MED ORDER — DULOXETINE HCL 30 MG PO CPEP: 30.0000 mg | ORAL_CAPSULE | Freq: Every day | ORAL | 3 refills | Status: DC

## 2021-02-04 NOTE — Progress Notes (Signed)
Chief Complaint  Patient presents with   New Patient (Initial Visit)    RM 16, alone. Internal referral from Selena Merino, MD for bilateral hand/foot numbness. First time she is seeing a neurologist. Sx started about 3 yr ago. Started worsening in the last 72month-38yr. Sx intermittent. Hands worse at night. Foot numbness worse during the day. Has ting/numb when she tries to hold items. Has had previous imaging but has been about 2 years/[place]   PCP    ARonnie Doss DO      ASSESSMENT AND PLAN  Selena Howard a 46y.o. female   Chronic diffuse body achy pain, multiple joints pain, upper and lower extremity paresthesia  Decreased ankle reflex on examination,  EMG nerve conduction study to rule out peripheral neuropathy  X-ray of multiple joints showed osteoarthritis, will continue to work with her rheumatologist  Suboptimal control with gabapentin 600 mg 3 times daily, add on Cymbalta 30 mg daily, also advised patient to heavy dose gabapentin before bedtime to help her sleep  At risk for obstructive sleep apnea  Narrow oropharyngeal space, obesity, on schedule for sleep consultation   DIAGNOSTIC DATA (LABS, IMAGING, TESTING) - I reviewed patient records, labs, notes, testing and imaging myself where available.  X-ray of left f/right toot, hands, osteoarthritis,   X-ray of bilateral knee these findings are consistent with moderate osteoarthritis and  moderate chondromalacia patella.  Lab in Jan 2023:  MEDICAL HISTORY:  Selena Howard a 46year old female, seen in request by Selena Howard for evaluation of intermittent bilateral upper and lower extremity paresthesia, her primary care physician is Dr. GJanora Howard initial evaluation was on February 04, 2021  I reviewed and summarized the referring note. PMHx Anxiety  She is reported long history of chronic multiple joints pain, intermittent bilateral hands and feet  paresthesia, chronic low back pain, gradually getting worse over the past few years, not developed bilateral low back pain radiating pain to bilateral lower extremity, and bilateral foot, she complains of difficulty walking mainly attributed to her knee pain, foot pain, and obesity,  She denies bowel or bladder incontinence  She was seen by rheumatologist Selena Howard extensive laboratory evaluations, normal or negative cyclic citrul peptide antibody, rheumatoid factor, TSH, CPK, CMP, lipid panel, CBC, thyroid functional test, random insulin, ANA, mild elevated ESR 31, C-reactive protein 19   PHYSICAL EXAM:   Vitals:   02/04/21 0822  BP: 105/70  Pulse: 79  SpO2: 99%  Weight: 260 lb (117.9 kg)  Height: 5' 3"  (1.6 m)   Not recorded     Body mass index is 46.06 kg/m.  PHYSICAL EXAMNIATION:  Gen: NAD, conversant, well nourised, well groomed                     Cardiovascular: Regular rate rhythm, no peripheral edema, warm, nontender. Eyes: Conjunctivae clear without exudates or hemorrhage Neck: Supple, no carotid bruits. Pulmonary: Clear to auscultation bilaterally   NEUROLOGICAL EXAM:  MENTAL STATUS: Speech:    Speech is normal; fluent and spontaneous with normal comprehension.  Cognition:     Orientation to time, place and person     Normal recent and remote memory     Normal Attention span and concentration     Normal Language, naming, repeating,spontaneous speech     Fund of knowledge   CRANIAL NERVES: CN II: Visual fields are full to confrontation. Pupils are round equal and briskly reactive to light.  CN III, IV, VI: extraocular movement are normal. No ptosis. CN V: Facial sensation is intact to light touch CN VII: Face is symmetric with normal eye closure  CN VIII: Hearing is normal to causal conversation. CN IX, X: Phonation is normal. CN XI: Head turning and shoulder shrug are intact CN XII: Narrow oropharyngeal space  MOTOR: There is no pronator drift of  out-stretched arms. Muscle bulk and tone are normal. Muscle strength is normal.  REFLEXES: Reflexes are 1  and symmetric at the biceps, triceps, knees, and absent at ankles. Plantar responses are flexor.  SENSORY: Intact to light touch, pinprick and vibratory sensation are intact in fingers and toes.  COORDINATION: There is no trunk or limb dysmetria noted.  GAIT/STANCE: Need push-up to get up from seated position, mildly antalgic, limited by her big body habitus  REVIEW OF SYSTEMS:  Full 14 system review of systems performed and notable only for as above All other review of systems were negative.   ALLERGIES: Allergies  Allergen Reactions   Keflex [Cephalexin] Nausea And Vomiting    HOME MEDICATIONS: Current Outpatient Medications  Medication Sig Dispense Refill   betamethasone valerate (VALISONE) 0.1 % cream Apply topically 2 (two) times daily. X10-14 days to foot 30 g 1   Cholecalciferol (VITAMIN D3) 1.25 MG (50000 UT) CAPS Take 1 capsule by mouth once a week.     ciclopirox (PENLAC) 8 % solution Apply topically daily.     esomeprazole (NEXIUM) 40 MG capsule Take 1 capsule (40 mg total) by mouth daily. 30 capsule 3   FLUoxetine (PROZAC) 20 MG capsule Take 20 mg by mouth daily.     gabapentin (NEURONTIN) 300 MG capsule Take 2 capsules (600 mg total) by mouth 3 (three) times daily. 180 capsule 2   ibuprofen (ADVIL) 800 MG tablet Take 800 mg by mouth every 8 (eight) hours as needed.     traMADol (ULTRAM) 50 MG tablet Take 1 tablet (50 mg total) by mouth every 12 (twelve) hours as needed for severe pain. Put on file 30 tablet 0   No current facility-administered medications for this visit.    PAST MEDICAL HISTORY: Past Medical History:  Diagnosis Date   Anxiety    Arthritis    Endometriosis    now s/p partial hysterectomy 06/2020   Spinal stenosis at L4-L5 level     PAST SURGICAL HISTORY: Past Surgical History:  Procedure Laterality Date   ABDOMINAL HYSTERECTOMY      partial, ovary sparing   CESAREAN SECTION     WISDOM TOOTH EXTRACTION Bilateral     FAMILY HISTORY: Family History  Problem Relation Age of Onset   Diabetes Mother    Liver disease Mother    COPD Father    Diabetes Father    Heart disease Maternal Grandmother    Cancer Maternal Grandfather    Cancer Paternal Grandmother    Diabetes Paternal Grandfather     SOCIAL HISTORY: Social History   Socioeconomic History   Marital status: Divorced    Spouse name: Not on file   Number of children: 3   Years of education: 12   Highest education level: Not on file  Occupational History   Not on file  Tobacco Use   Smoking status: Former    Packs/day: 0.50    Years: 10.00    Pack years: 5.00    Types: Cigarettes    Quit date: 06/02/2020    Years since quitting: 0.6   Smokeless tobacco: Never  Vaping  Use   Vaping Use: Never used  Substance and Sexual Activity   Alcohol use: Yes    Comment: rare   Drug use: Not Currently   Sexual activity: Not Currently  Other Topics Concern   Not on file  Social History Narrative   Right handed   Caffeine use: no tea, Coffee-tries to drink decaf, diet sodas   Mother also sees me, [name]   Social Determinants of Health   Financial Resource Strain: Not on file  Food Insecurity: Not on file  Transportation Needs: Not on file  Physical Activity: Not on file  Stress: Not on file  Social Connections: Not on file  Intimate Partner Violence: Not on file      Marcial Pacas, M.D. Ph.D.  [facility] [address], [place], [place] [address] Ph: [phone] Fax: [phone]  CC:  Selena Howard, [address] [place],  [place] [id number]  [name], DO

## 2021-02-09 ENCOUNTER — Encounter (HOSPITAL_COMMUNITY): Payer: Medicaid Other | Admitting: Physical Therapy

## 2021-02-09 ENCOUNTER — Telehealth (HOSPITAL_COMMUNITY): Payer: Self-pay | Admitting: Physical Therapy

## 2021-02-09 NOTE — Telephone Encounter (Signed)
She had something come up with her dad and she has to take him to the MD this morning.

## 2021-02-14 NOTE — Progress Notes (Signed)
Office Visit Note  Patient: Selena Howard             Date of Birth: [birth year]           MRN: [id number]             PCP: Selena Norlander, DO Referring: Selena Norlander, DO Visit Date: 02/16/2021 Occupation: @GUAROCC @  Subjective:  Pain in multiple joints and muscle  History of Present Illness: Selena Howard is a 46 y.o. female returns for follow-up visit.  She continues to have pain and discomfort in her bilateral hands, bilateral knee joints and her feet.  She states her right knee joint has been painful and she has been favoring her right knee.  She went to physical therapy for her lower back which was helpful.  She also continues to have generalized pain.  She also gives history of hyperalgesia.  She has not been sleeping well. Sleep study is pending.  Activities of Daily Living:  Patient reports morning stiffness for 10-30 minutes.   Patient Reports nocturnal pain.  Difficulty dressing/grooming: Reports Difficulty climbing stairs: Reports Difficulty getting out of chair: Reports Difficulty using hands for taps, buttons, cutlery, and/or writing: Denies  Review of Systems  Constitutional:  Positive for fatigue.  HENT:  Negative for mouth sores, mouth dryness and nose dryness.   Eyes:  Positive for visual disturbance. Negative for photophobia, pain, itching and dryness.  Respiratory:  Positive for shortness of breath. Negative for difficulty breathing.   Cardiovascular:  Positive for palpitations. Negative for chest pain.  Gastrointestinal:  Negative for blood in stool, constipation and diarrhea.  Endocrine: Negative for increased urination.  Genitourinary:  Positive for difficulty urinating.  Musculoskeletal:  Positive for joint pain, joint pain, joint swelling, myalgias, morning stiffness, muscle tenderness and myalgias.  Skin:  Negative for color change, rash, redness and sensitivity to sunlight.  Allergic/Immunologic: Positive for susceptible to infections.   Neurological:  Positive for numbness, headaches, parasthesias and weakness. Negative for dizziness and memory loss.  Hematological:  Negative for bruising/bleeding tendency.  Psychiatric/Behavioral:  Negative for confusion.    PMFS History:  Patient Active Problem List   Diagnosis Date Noted   Paresthesia 02/04/2021   Obstructive sleep apnea 02/04/2021   History of gastroesophageal reflux (GERD) 01/28/2021   Vitamin D deficiency 01/28/2021   History of anxiety 01/28/2021   Bilateral hand numbness 12/16/2020   Other intervertebral disc degeneration, lumbar region 12/16/2020    Past Medical History:  Diagnosis Date   Anxiety    Arthritis    Endometriosis    now s/p partial hysterectomy 06/2020   Spinal stenosis at L4-L5 level     Family History  Problem Relation Age of Onset   Diabetes Mother    Liver disease Mother    COPD Father    Diabetes Father    Heart disease Maternal Grandmother    Cancer Maternal Grandfather    Cancer Paternal Grandmother    Diabetes Paternal Grandfather    Past Surgical History:  Procedure Laterality Date   ABDOMINAL HYSTERECTOMY     partial, ovary sparing   CESAREAN SECTION     WISDOM TOOTH EXTRACTION Bilateral    Social History   Social History Narrative   Right handed   Caffeine use: no tea, Coffee-tries to drink decaf, diet sodas   Mother also sees me, Selena Howard    There is no immunization history on file for this patient.   Objective: Vital Signs: BP 134/90 (BP Location: Left  Arm, Patient Position: Sitting, Cuff Size: Large)    Pulse 71    Ht 5\' 3"  (1.6 m)    Wt 258 lb (117 kg)    BMI 45.70 kg/m    Physical Exam Vitals and nursing note reviewed.  Constitutional:      Appearance: She is well-developed.  HENT:     Head: Normocephalic and atraumatic.  Eyes:     Conjunctiva/sclera: Conjunctivae normal.  Cardiovascular:     Rate and Rhythm: Normal rate and regular rhythm.     Heart sounds: Normal heart sounds.   Pulmonary:     Effort: Pulmonary effort is normal.     Breath sounds: Normal breath sounds.  Abdominal:     General: Bowel sounds are normal.     Palpations: Abdomen is soft.  Musculoskeletal:     Cervical back: Normal range of motion.  Lymphadenopathy:     Cervical: No cervical adenopathy.  Skin:    General: Skin is warm and dry.     Capillary Refill: Capillary refill takes less than 2 seconds.  Neurological:     Mental Status: She is alert and oriented to person, place, and time.  Psychiatric:        Behavior: Behavior normal.     Musculoskeletal Exam: C-spine was in good range of motion.  Shoulder joints, elbow joints, wrist joints, MCPs PIPs and DIPs with good range of motion with no synovitis.  She has some tenderness over PIP and DIPs.  Hip joints and knee joints with good range of motion.  She had discomfort range of motion of bilateral knee joints without any warmth swelling or effusion.  There was no tenderness over ankles or MTPs.  She had generalized hyperalgesia and positive tender points.  CDAI Exam: CDAI Score: -- Patient Global: --; Provider Global: -- Swollen: --; Tender: -- Joint Exam 02/16/2021   No joint exam has been documented for this visit   There is currently no information documented on the homunculus. Go to the Rheumatology activity and complete the homunculus joint exam.  Investigation: No additional findings.  Imaging: ECHOCARDIOGRAM COMPLETE  Result Date: 01/18/2021    ECHOCARDIOGRAM REPORT   Patient Name:   Selena Howard Date of Exam: 01/18/2021 Medical Rec #:  [id number]      Height:       63.0 in Accession #:    [id number]     Weight:       248.0 lb Date of Birth:  [birth year]     BSA:          2.119 m Patient Age:    63 years       BP:           133/80 mmHg Patient Gender: F              HR:           74 bpm. Exam Location:  [facility] Procedure: 2D Echo, Cardiac Doppler and Color Doppler Indications:    R00.2 (ICD-10-CM) - Palpitations  History:         Patient has no prior history of Echocardiogram examinations.                 Risk Factors:Former Smoker. Hx of COVID-19.  Sonographer:    Alvino Chapel RCS Referring Phys: [place]  1. Left ventricular ejection fraction, by estimation, is 60 to 65%. The left ventricle has normal function. The left ventricle has no regional wall motion abnormalities. Left  ventricular diastolic parameters were normal.  2. Right ventricular systolic function is normal. The right ventricular size is normal. Tricuspid regurgitation signal is inadequate for assessing PA pressure.  3. The mitral valve is normal in structure. No evidence of mitral valve regurgitation. No evidence of mitral stenosis.  4. The aortic valve is tricuspid. Aortic valve regurgitation is not visualized. No aortic stenosis is present.  5. The inferior vena cava is normal in size with greater than 50% respiratory variability, suggesting right atrial pressure of 3 mmHg. FINDINGS  Left Ventricle: Left ventricular ejection fraction, by estimation, is 60 to 65%. The left ventricle has normal function. The left ventricle has no regional wall motion abnormalities. The left ventricular internal cavity size was normal in size. There is  no left ventricular hypertrophy. Left ventricular diastolic parameters were normal. Right Ventricle: The right ventricular size is normal. No increase in right ventricular wall thickness. Right ventricular systolic function is normal. Tricuspid regurgitation signal is inadequate for assessing PA pressure. Left Atrium: Left atrial size was normal in size. Right Atrium: Right atrial size was normal in size. Pericardium: There is no evidence of pericardial effusion. Mitral Valve: The mitral valve is normal in structure. No evidence of mitral valve regurgitation. No evidence of mitral valve stenosis. Tricuspid Valve: The tricuspid valve is normal in structure. Tricuspid valve regurgitation is not demonstrated. No  evidence of tricuspid stenosis. Aortic Valve: The aortic valve is tricuspid. Aortic valve regurgitation is not visualized. No aortic stenosis is present. Aortic valve mean gradient measures 4.7 mmHg. Aortic valve peak gradient measures 9.2 mmHg. Aortic valve area, by VTI measures 2.51 cm. Pulmonic Valve: The pulmonic valve was not well visualized. Pulmonic valve regurgitation is not visualized. No evidence of pulmonic stenosis. Aorta: The aortic root is normal in size and structure. Venous: The inferior vena cava is normal in size with greater than 50% respiratory variability, suggesting right atrial pressure of 3 mmHg. IAS/Shunts: No atrial level shunt detected by color flow Doppler.  LEFT VENTRICLE PLAX 2D LVIDd:         4.40 cm   Diastology LVIDs:         2.70 cm   LV e' medial:    10.20 cm/s LV PW:         0.70 cm   LV E/e' medial:  10.6 LV IVS:        0.80 cm   LV e' lateral:   11.60 cm/s LVOT diam:     2.00 cm   LV E/e' lateral: 9.3 LV SV:         85 LV SV Index:   40 LVOT Area:     3.14 cm  RIGHT VENTRICLE RV S prime:     10.80 cm/s TAPSE (M-mode): 2.7 cm LEFT ATRIUM             Index        RIGHT ATRIUM           Index LA diam:        3.80 cm 1.79 cm/m   RA Area:     13.30 cm LA Vol (A2C):   43.2 ml 20.39 ml/m  RA Volume:   27.60 ml  13.03 ml/m LA Vol (A4C):   39.1 ml 18.45 ml/m LA Biplane Vol: 44.5 ml 21.00 ml/m  AORTIC VALVE AV Area (Vmax):    2.36 cm AV Area (Vmean):   2.29 cm AV Area (VTI):     2.51 cm AV Vmax:  152.03 cm/s AV Vmean:          102.092 cm/s AV VTI:            0.338 m AV Peak Grad:      9.2 mmHg AV Mean Grad:      4.7 mmHg LVOT Vmax:         114.00 cm/s LVOT Vmean:        74.300 cm/s LVOT VTI:          0.270 m LVOT/AV VTI ratio: 0.80  AORTA Ao Root diam: 3.10 cm MITRAL VALVE MV Area (PHT): 3.91 cm     SHUNTS MV Decel Time: 194 msec     Systemic VTI:  0.27 m MV E velocity: 108.00 cm/s  Systemic Diam: 2.00 cm MV A velocity: 48.80 cm/s MV E/A ratio:  2.21 Carlyle Dolly  MD Electronically signed by Carlyle Dolly MD Signature Date/Time: 01/18/2021/2:04:53 PM    Final    XR Foot 2 Views Left  Result Date: 01/28/2021 PIP and DIP narrowing was noted.  No MTP, intertarsal, tibiotalar or subtalar joint space narrowing was noted.  No erosive changes were noted. Impression: These findings are consistent with osteoarthritis of the foot.  XR Foot 2 Views Right  Result Date: 01/28/2021 PIP and DIP narrowing was noted.  No MTP, intertarsal, tibiotalar or subtalar joint space narrowing was noted.  No erosive changes were noted. Impression: These findings are consistent with osteoarthritis of the foot.  XR Hand 2 View Left  Result Date: 01/28/2021 [facility] and PIP narrowing was noted.  No MCP, intercarpal or radiocarpal joint space narrowing was noted.  No erosive changes noted. Impression: These findings are consistent with early osteoarthritis of the hand.  XR Hand 2 View Right  Result Date: 01/28/2021 [facility] and PIP narrowing was noted.  No MCP, intercarpal or radiocarpal joint space narrowing was noted.  No erosive changes noted. Impression: These findings are consistent with early osteoarthritis of the hand.  XR KNEE 3 VIEW LEFT  Result Date: 01/28/2021 Moderate medial compartment narrowing was noted.  Moderate patellofemoral narrowing was noted.  No chondrocalcinosis was noted. Impression: These findings are consistent with moderate osteoarthritis and moderate chondromalacia patella.  XR KNEE 3 VIEW RIGHT  Result Date: 01/28/2021 Moderate medial compartment narrowing was noted.  Moderate patellofemoral narrowing was noted.  No chondrocalcinosis was noted. Impression: These findings are consistent with moderate osteoarthritis and moderate chondromalacia patella.   Recent Labs: Lab Results  Component Value Date   WBC 5.8 11/30/2020   HGB 12.5 11/30/2020   PLT 220 11/30/2020   NA 140 11/30/2020   K 4.1 11/30/2020   CL 102 11/30/2020   CO2 21 11/30/2020   GLUCOSE 91  11/30/2020   BUN 23 11/30/2020   CREATININE 1.07 (H) 11/30/2020   BILITOT 0.3 11/30/2020   ALKPHOS 71 11/30/2020   AST 15 11/30/2020   ALT 13 11/30/2020   PROT 6.4 11/30/2020   ALBUMIN 4.0 11/30/2020   CALCIUM 8.9 11/30/2020   January 28, 2021 TSH normal, CK normal, sed rate 31, RF negative, anti-CCP negative    Speciality Comments: No specialty comments available.  Procedures:  Large Joint Inj: R knee on 02/16/2021 2:51 PM Indications: pain Details: 27 G 1.5 in needle, medial approach  Arthrogram: No  Medications: 40 mg triamcinolone acetonide 40 MG/ML; 1.5 mL lidocaine 1 % Aspirate: 0 mL Outcome: tolerated well, no immediate complications Procedure, treatment alternatives, risks and benefits explained, specific risks discussed. Consent was given by the patient.  Immediately prior to procedure a time out was called to verify the correct patient, procedure, equipment, support staff and site/side marked as required. Patient was prepped and draped in the usual sterile fashion.    Allergies: Keflex [cephalexin]   Assessment / Plan:     Visit Diagnoses: Primary osteoarthritis of both hands - Clinical and radiographic findings are consistent with osteoarthritis.  All autoimmune work-up negative.  X-ray and lab findings were discussed with patient at length.  Joint protection muscle strengthening was discussed.  A handout on hand exercises was given.  Bilateral hand numbness - Patient was referred to neurology.  Primary osteoarthritis of both knees - No warmth swelling or effusion was noted.  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  X-ray findings were discussed with the patient.  Weight loss diet and exercise was emphasized.  I will also refer her to physical therapy.  She was having a lot of discomfort in her right knee joint after informed consent was obtained and different treatment options were discussed, right knee joint was injected with  lidocaine and cortisone as described above.  She tolerated the procedure well.  Postprocedure instructions were given.  Use of NSAIDs was also discussed.  She has reflux and cannot take NSAIDs for a long time.  Primary osteoarthritis of both feet - Clinical and radiographic findings are consistent with osteoarthritis.  X-ray findings were discussed with the appropriate fitting shoes were advised.  Numbness in feet - Patient was referred to neurology.  DDD (degenerative disc disease), lumbar - History of degenerative disc disease and spinal stenosis per patient.  She recently finished going to the physical therapy.  Fibromyalgia - She has generalized pain, hyperalgesia and positive tender points.  She also has positive family history of fibromyalgia.  CK was normal.  Detailed counsel regarding fibromyalgia syndrome was provided.  She is already on Cymbalta.  Need for regular exercise including water aerobics and sitting was emphasized.  I will refer her to integrative therapies.  Other fatigue - She has history of chronic fatigue for many years.  Most likely related to fibromyalgia and insomnia.  Other insomnia - Good sleep hygiene was discussed.  Sleep study is pending.  Vitamin D deficiency - She takes vitamin D supplement.  History of anxiety  History of gastroesophageal reflux (GERD)  Onychomycosis  Family history of fibromyalgia  Former smoker - Half a pack per day for 15 years.  She quit smoking in May 2022.  Orders: Orders Placed This Encounter  Procedures   Large Joint Inj   No orders of the defined types were placed in this encounter.    Follow-Up Instructions: Return in about 6 months (around 08/16/2021) for Osteoarthritis.   Bo Merino, MD  Note - This record has been created using Editor, commissioning.  Chart creation errors have been sought, but may not always  have been located. Such creation errors do not reflect on  the standard of medical care.

## 2021-02-16 ENCOUNTER — Ambulatory Visit (HOSPITAL_COMMUNITY): Payer: Medicaid Other | Admitting: Physical Therapy

## 2021-02-16 ENCOUNTER — Ambulatory Visit: Payer: Medicaid Other | Admitting: Rheumatology

## 2021-02-16 ENCOUNTER — Other Ambulatory Visit: Payer: Self-pay

## 2021-02-16 ENCOUNTER — Encounter: Payer: Self-pay | Admitting: Rheumatology

## 2021-02-16 VITALS — BP 134/90 | HR 71 | Ht 63.0 in | Wt 258.0 lb

## 2021-02-16 DIAGNOSIS — M170 Bilateral primary osteoarthritis of knee: Secondary | ICD-10-CM

## 2021-02-16 DIAGNOSIS — Z8719 Personal history of other diseases of the digestive system: Secondary | ICD-10-CM

## 2021-02-16 DIAGNOSIS — M545 Low back pain, unspecified: Secondary | ICD-10-CM | POA: Diagnosis not present

## 2021-02-16 DIAGNOSIS — M19041 Primary osteoarthritis, right hand: Secondary | ICD-10-CM | POA: Diagnosis not present

## 2021-02-16 DIAGNOSIS — R200 Anesthesia of skin: Secondary | ICD-10-CM | POA: Diagnosis not present

## 2021-02-16 DIAGNOSIS — M19071 Primary osteoarthritis, right ankle and foot: Secondary | ICD-10-CM | POA: Diagnosis not present

## 2021-02-16 DIAGNOSIS — B351 Tinea unguium: Secondary | ICD-10-CM

## 2021-02-16 DIAGNOSIS — Z8659 Personal history of other mental and behavioral disorders: Secondary | ICD-10-CM

## 2021-02-16 DIAGNOSIS — Z8269 Family history of other diseases of the musculoskeletal system and connective tissue: Secondary | ICD-10-CM

## 2021-02-16 DIAGNOSIS — G8929 Other chronic pain: Secondary | ICD-10-CM

## 2021-02-16 DIAGNOSIS — M19042 Primary osteoarthritis, left hand: Secondary | ICD-10-CM

## 2021-02-16 DIAGNOSIS — Z87891 Personal history of nicotine dependence: Secondary | ICD-10-CM

## 2021-02-16 DIAGNOSIS — G4709 Other insomnia: Secondary | ICD-10-CM

## 2021-02-16 DIAGNOSIS — M797 Fibromyalgia: Secondary | ICD-10-CM

## 2021-02-16 DIAGNOSIS — E559 Vitamin D deficiency, unspecified: Secondary | ICD-10-CM

## 2021-02-16 DIAGNOSIS — R5383 Other fatigue: Secondary | ICD-10-CM

## 2021-02-16 DIAGNOSIS — M19072 Primary osteoarthritis, left ankle and foot: Secondary | ICD-10-CM

## 2021-02-16 DIAGNOSIS — M6281 Muscle weakness (generalized): Secondary | ICD-10-CM

## 2021-02-16 DIAGNOSIS — M5136 Other intervertebral disc degeneration, lumbar region: Secondary | ICD-10-CM

## 2021-02-16 MED ORDER — LIDOCAINE HCL 1 % IJ SOLN
1.5000 mL | INTRAMUSCULAR | Status: AC | PRN
  Administered 2021-02-16: 1.5 mL

## 2021-02-16 MED ORDER — TRIAMCINOLONE ACETONIDE 40 MG/ML IJ SUSP
40.0000 mg | INTRAMUSCULAR | Status: AC | PRN
  Administered 2021-02-16: 40 mg via INTRA_ARTICULAR

## 2021-02-16 NOTE — Therapy (Signed)
[facility] [address] [place], [place], [address] Phone: [phone]   Fax:  [phone]  Physical Therapy Treatment  Patient Details  Name: Selena Howard MRN: [id number] Date of Birth: 1975-06-03 Referring Provider (PT): Dr. Rodell Perna  PHYSICAL THERAPY DISCHARGE SUMMARY  Visits from Start of Care: 5  Current functional level related to goals / functional outcomes: See below    Remaining deficits: See below    Education / Equipment: See assessment   Patient agrees to discharge. Patient goals were partially met. Patient is being discharged due to lack of progress.  Encounter Date: 02/16/2021   PT End of Session - 02/16/21 1043     Visit Number 5    Number of Visits 6    Date for PT Re-Evaluation 02/16/21    Authorization Type Medicaid [employer]    Authorization Time Period 6 visits 1/3-02/09/21    PT Start Time 1040    PT Stop Time 1115    PT Time Calculation (min) 35 min    Activity Tolerance Patient tolerated treatment well    Behavior During Therapy [facility] for tasks assessed/performed             Past Medical History:  Diagnosis Date   Anxiety    Arthritis    Endometriosis    now s/p partial hysterectomy 06/2020   Spinal stenosis at L4-L5 level     Past Surgical History:  Procedure Laterality Date   ABDOMINAL HYSTERECTOMY     partial, ovary sparing   CESAREAN SECTION     WISDOM TOOTH EXTRACTION Bilateral     There were no vitals filed for this visit.   Subjective Assessment - 02/16/21 1044     Subjective Patient says things are about the same. Still ongoing stiffness. Today is a good day so far. Pain is a 6. Not sure how much improvement since starting therapy because every day is different.    Currently in Pain? Yes    Pain Score 6     Pain Location Back    Pain Orientation Lower    Pain Descriptors / Indicators Aching;Sore    Pain Type Chronic pain                [facility] PT Assessment -  02/16/21 0001       Assessment   Medical Diagnosis Chronic bilateral low back pain    Referring Provider (PT) Dr. Rodell Perna      Balance Screen   Has the patient fallen in the past 6 months No      Prior Function   Level of Independence Independent      Cognition   Overall Cognitive Status Within Functional Limits for tasks assessed      AROM   Lumbar Flexion WNL    Lumbar Extension 30% limited   was 50% limited     Strength   Strength Assessment Site Hip;Lumbar    Right/Left Hip Right;Left    Right Hip Flexion 4+/5    Right Hip Extension 3+/5    Right Hip ABduction 4/5    Left Hip Flexion 4+/5    Left Hip Extension 3+/5    Left Hip ABduction 4/5    Lumbar Flexion 3+/5    Lumbar Extension 4/5                           [facility] Adult PT Treatment/Exercise - 02/16/21 0001  Lumbar Exercises: Supine   Ab Set 10 reps;5 seconds    Dead Bug 10 reps    Bridge 5 reps    Bridge Limitations 10 sec holds                       PT Short Term Goals - 02/16/21 1113       PT SHORT TERM GOAL #1   Title Patient will be independent with HEP in order to improve functional outcomes.    Time 3    Period Weeks    Status Achieved    Target Date 01/19/21      PT SHORT TERM GOAL #2   Title Identify movement patter that provides centralization of LE symptoms    Baseline Notes improvement with extension based activity    Time 3    Period Weeks    Status Achieved    Target Date 01/19/21               PT Long Term Goals - 02/16/21 1115       PT LONG TERM GOAL #1   Title Increase trunk strength to 4/5 to improve lumbar stability to reduce pain    Baseline See MMT    Time 6    Period Weeks    Status Partially Met    Target Date 02/09/21      PT LONG TERM GOAL #2   Title Demo increased lumbar extension ROM to 25% limited to improve body mechanics    Baseline 30% limited    Time 6    Period Weeks    Status Not Met    Target Date  02/09/21                   Plan - 02/16/21 1110     Clinical Impression Statement Patient shows limited improvement overall. She has improved lumbar mobility but continues to be significantly limited by hip and core weakness, despite reported compliance with HEP. She has follow ups with pain management and rheumatology. Reviewed comprehensive HEP to address remaining areas of weakness. At this time, patient is being DC due to lack of progress. Encouraged patient to follow up with therapy services with any further questions or concerns.    Personal Factors and Comorbidities Time since onset of injury/illness/exacerbation;Fitness    Examination-Activity Limitations Squat;Stand;Lift;Carry;Bend;Locomotion Level;Sit    Examination-Participation Restrictions Cleaning;Community Activity;Driving;Yard Work;Shop;Meal Prep    Stability/Clinical Decision Making Stable/Uncomplicated    Rehab Potential Fair    PT Treatment/Interventions ADLs/Self Care Home Management;Electrical Stimulation;Traction;Gait training;Functional mobility training;Therapeutic activities;Therapeutic exercise;Balance training;Neuromuscular re-education;Manual techniques;Dry needling;Spinal Manipulations;Joint Manipulations    PT Next Visit Plan DC to HEP    PT Home Exercise Plan prone, prone on elbows; 02-09-22: ab set, dead bug isometric hold, bridge 21/ clam, quadruped hip extension, LTR    Consulted and Agree with Plan of Care Patient             Patient will benefit from skilled therapeutic intervention in order to improve the following deficits and impairments:  Decreased range of motion, Decreased strength, Impaired perceived functional ability, Pain, Obesity  Visit Diagnosis: Chronic bilateral low back pain, unspecified whether sciatica present  Muscle weakness (generalized)     Problem List Patient Active Problem List   Diagnosis Date Noted   Paresthesia 02/04/2021   Obstructive sleep apnea 02/04/2021    History of gastroesophageal reflux (GERD) 01/28/2021   Vitamin D deficiency 01/28/2021   History of anxiety 01/28/2021  Bilateral hand numbness 12/16/2020   Other intervertebral disc degeneration, lumbar region 12/16/2020   11:17 AM, 02/16/21 Josue Hector PT DPT  Physical Therapist with [facility]  [phone] [place] [address] [place], [place], [address] Phone: [phone]   Fax:  [phone]  Name: Selena Howard MRN: [id number] Date of Birth: [birth year]

## 2021-02-16 NOTE — Patient Instructions (Signed)
Hand Exercises Hand exercises can be helpful for almost anyone. These exercises can strengthen the hands, improve flexibility and movement, and increase blood flow to the hands. These results can make work and daily tasks easier. Hand exercises can be especially helpful for people who have joint pain from arthritis or have nerve damage from overuse (carpal tunnel syndrome). These exercises can also help people who have injured a hand. Exercises Most of these hand exercises are gentle stretching and motion exercises. It is usually safe to do them often throughout the day. Warming up your hands before exercise may help to reduce stiffness. You can do this with gentle massage or by placing your hands in warm water for 10-15 minutes. It is normal to feel some stretching, pulling, tightness, or mild discomfort as you begin new exercises. This will gradually improve. Stop an exercise right away if you feel sudden, severe pain or your pain gets worse. Ask your health care provider which exercises are best for you. Knuckle bend or "claw" fist  Stand or sit with your arm, hand, and all five fingers pointed straight up. Make sure to keep your wrist straight during the exercise. Gently bend your fingers down toward your palm until the tips of your fingers are touching the top of your palm. Keep your big knuckle straight and just bend the small knuckles in your fingers. Hold this position for __________ seconds. Straighten (extend) your fingers back to the starting position. Repeat this exercise 5-10 times with each hand. Full finger fist  Stand or sit with your arm, hand, and all five fingers pointed straight up. Make sure to keep your wrist straight during the exercise. Gently bend your fingers into your palm until the tips of your fingers are touching the middle of your palm. Hold this position for __________ seconds. Extend your fingers back to the starting position, stretching every joint fully. Repeat  this exercise 5-10 times with each hand. Straight fist Stand or sit with your arm, hand, and all five fingers pointed straight up. Make sure to keep your wrist straight during the exercise. Gently bend your fingers at the big knuckle, where your fingers meet your hand, and the middle knuckle. Keep the knuckle at the tips of your fingers straight and try to touch the bottom of your palm. Hold this position for __________ seconds. Extend your fingers back to the starting position, stretching every joint fully. Repeat this exercise 5-10 times with each hand. Tabletop  Stand or sit with your arm, hand, and all five fingers pointed straight up. Make sure to keep your wrist straight during the exercise. Gently bend your fingers at the big knuckle, where your fingers meet your hand, as far down as you can while keeping the small knuckles in your fingers straight. Think of forming a tabletop with your fingers. Hold this position for __________ seconds. Extend your fingers back to the starting position, stretching every joint fully. Repeat this exercise 5-10 times with each hand. Finger spread  Place your hand flat on a table with your palm facing down. Make sure your wrist stays straight as you do this exercise. Spread your fingers and thumb apart from each other as far as you can until you feel a gentle stretch. Hold this position for __________ seconds. Bring your fingers and thumb tight together again. Hold this position for __________ seconds. Repeat this exercise 5-10 times with each hand. Making circles  Stand or sit with your arm, hand, and all five fingers pointed  straight up. Make sure to keep your wrist straight during the exercise. Make a circle by touching the tip of your thumb to the tip of your index finger. Hold for __________ seconds. Then open your hand wide. Repeat this motion with your thumb and each finger on your hand. Repeat this exercise 5-10 times with each hand. Thumb  motion  Sit with your forearm resting on a table and your wrist straight. Your thumb should be facing up toward the ceiling. Keep your fingers relaxed as you move your thumb. Lift your thumb up as high as you can toward the ceiling. Hold for __________ seconds. Bend your thumb across your palm as far as you can, reaching the tip of your thumb for the small finger (pinkie) side of your palm. Hold for __________ seconds. Repeat this exercise 5-10 times with each hand. Grip strengthening  Hold a stress ball or other soft ball in the middle of your hand. Slowly increase the pressure, squeezing the ball as much as you can without causing pain. Think of bringing the tips of your fingers into the middle of your palm. All of your finger joints should bend when doing this exercise. Hold your squeeze for __________ seconds, then relax. Repeat this exercise 5-10 times with each hand. Contact a health care provider if: Your hand pain or discomfort gets much worse when you do an exercise. Your hand pain or discomfort does not improve within 2 hours after you exercise. If you have any of these problems, stop doing these exercises right away. Do not do them again unless your health care provider says that you can. Get help right away if: You develop sudden, severe hand pain or swelling. If this happens, stop doing these exercises right away. Do not do them again unless your health care provider says that you can. This information is not intended to replace advice given to you by your health care provider. Make sure you discuss any questions you have with your health care provider. Document Revised: 04/08/2020 Document Reviewed: 04/08/2020 Elsevier Patient Education  [place]. Knee Exercises Ask your health care provider which exercises are safe for you. Do exercises exactly as told by your health care provider and adjust them as directed. It is normal to feel mild stretching, pulling, tightness, or  discomfort as you do these exercises. Stop right away if you feel sudden pain or your pain gets worse. Do not begin these exercises until told by your health care provider. Stretching and range-of-motion exercises These exercises warm up your muscles and joints and improve the movement and flexibility of your knee. These exercises also help to relieve pain and swelling. Knee extension, prone  Lie on your abdomen (prone position) on a bed. Place your left / right knee just beyond the edge of the surface so your knee is not on the bed. You can put a towel under your left / right thigh just above your kneecap for comfort. Relax your leg muscles and allow gravity to straighten your knee (extension). You should feel a stretch behind your left / right knee. Hold this position for __________ seconds. Scoot up so your knee is supported between repetitions. Repeat __________ times. Complete this exercise __________ times a day. Knee flexion, active  Lie on your back with both legs straight. If this causes back discomfort, bend your left / right knee so your foot is flat on the floor. Slowly slide your left / right heel back toward your buttocks. Stop when  you feel a gentle stretch in the front of your knee or thigh (flexion). Hold this position for __________ seconds. Slowly slide your left / right heel back to the starting position. Repeat __________ times. Complete this exercise __________ times a day. Quadriceps stretch, prone  Lie on your abdomen on a firm surface, such as a bed or padded floor. Bend your left / right knee and hold your ankle. If you cannot reach your ankle or pant leg, loop a belt around your foot and grab the belt instead. Gently pull your heel toward your buttocks. Your knee should not slide out to the side. You should feel a stretch in the front of your thigh and knee (quadriceps). Hold this position for __________ seconds. Repeat __________ times. Complete this exercise  __________ times a day. Hamstring, supine  Lie on your back (supine position). Loop a belt or towel over the ball of your left / right foot. The ball of your foot is on the walking surface, right under your toes. Straighten your left / right knee and slowly pull on the belt to raise your leg until you feel a gentle stretch behind your knee (hamstring). Do not let your knee bend while you do this. Keep your other leg flat on the floor. Hold this position for __________ seconds. Repeat __________ times. Complete this exercise __________ times a day. Strengthening exercises These exercises build strength and endurance in your knee. Endurance is the ability to use your muscles for a long time, even after they get tired. Quadriceps, isometric This exercise strengthens the muscles in front of your thigh (quadriceps) without moving your knee joint (isometric). Lie on your back with your left / right leg extended and your other knee bent. Put a rolled towel or small pillow under your knee if told by your health care provider. Slowly tense the muscles in the front of your left / right thigh. You should see your kneecap slide up toward your hip or see increased dimpling just above the knee. This motion will push the back of the knee toward the floor. For __________ seconds, hold the muscle as tight as you can without increasing your pain. Relax the muscles slowly and completely. Repeat __________ times. Complete this exercise __________ times a day. Straight leg raises This exercise strengthens the muscles in front of your thigh (quadriceps) and the muscles that move your hips (hip flexors). Lie on your back with your left / right leg extended and your other knee bent. Tense the muscles in the front of your left / right thigh. You should see your kneecap slide up or see increased dimpling just above the knee. Your thigh may even shake a bit. Keep these muscles tight as you raise your leg 4-6 inches  (10-15 cm) off the floor. Do not let your knee bend. Hold this position for __________ seconds. Keep these muscles tense as you lower your leg. Relax your muscles slowly and completely after each repetition. Repeat __________ times. Complete this exercise __________ times a day. Hamstring, isometric  Lie on your back on a firm surface. Bend your left / right knee about __________ degrees. Dig your left / right heel into the surface as if you are trying to pull it toward your buttocks. Tighten the muscles in the back of your thighs (hamstring) to "dig" as hard as you can without increasing any pain. Hold this position for __________ seconds. Release the tension gradually and allow your muscles to relax completely for __________ seconds  after each repetition. Repeat __________ times. Complete this exercise __________ times a day. Hamstring curls If told by your health care provider, do this exercise while wearing ankle weights. Begin with __________lb / kg weights. Then increase the weight by 1 lb (0.5 kg) increments. Do not wear ankle weights that are more than __________lb / kg. Lie on your abdomen with your legs straight. Bend your left / right knee as far as you can without feeling pain. Keep your hips flat against the floor. Hold this position for __________ seconds. Slowly lower your leg to the starting position. Repeat __________ times. Complete this exercise __________ times a day. Squats This exercise strengthens the muscles in front of your thigh and knee (quadriceps). Stand in front of a table, with your feet and knees pointing straight ahead. You may rest your hands on the table for balance but not for support. Slowly bend your knees and lower your hips like you are going to sit in a chair. Keep your weight over your heels, not over your toes. Keep your lower legs upright so they are parallel with the table legs. Do not let your hips go lower than your knees. Do not bend lower  than told by your health care provider. If your knee pain increases, do not bend as low. Hold the squat position for __________ seconds. Slowly push with your legs to return to standing. Do not use your hands to pull yourself to standing. Repeat __________ times. Complete this exercise __________ times a day. Wall slides This exercise strengthens the muscles in front of your thigh and knee (quadriceps). Lean your back against a smooth wall or door, and walk your feet out 18-24 inches (46-61 cm) from it. Place your feet hip-width apart. Slowly slide down the wall or door until your knees bend __________ degrees. Keep your knees over your heels, not over your toes. Keep your knees in line with your hips. Hold this position for __________ seconds. Repeat __________ times. Complete this exercise __________ times a day. Straight leg raises, side-lying This exercise strengthens the muscles that rotate the leg at the hip and move it away from your body (hip abductors). Lie on your side with your left / right leg in the top position. Lie so your head, shoulder, knee, and hip line up. You may bend your bottom knee to help you keep your balance. Roll your hips slightly forward so your hips are stacked directly over each other and your left / right knee is facing forward. Leading with your heel, lift your top leg 4-6 inches (10-15 cm). You should feel the muscles in your outer hip lifting. Do not let your foot drift forward. Do not let your knee roll toward the ceiling. Hold this position for __________ seconds. Slowly return your leg to the starting position. Let your muscles relax completely after each repetition. Repeat __________ times. Complete this exercise __________ times a day. Straight leg raises, prone This exercise stretches the muscles that move your hips away from the front of the pelvis (hip extensors). Lie on your abdomen on a firm surface. You can put a pillow under your hips if that  is more comfortable. Tense the muscles in your buttocks and lift your left / right leg about 4-6 inches (10-15 cm). Keep your knee straight as you lift your leg. Hold this position for __________ seconds. Slowly lower your leg to the starting position. Let your leg relax completely after each repetition. Repeat __________ times. Complete this exercise  __________ times a day. This information is not intended to replace advice given to you by your health care provider. Make sure you discuss any questions you have with your health care provider. Document Revised: 08/31/2020 Document Reviewed: 08/31/2020 Elsevier Patient Education  [place].   Heart Disease Prevention   Your inflammatory disease increases your risk of heart disease which includes heart attack, stroke, atrial fibrillation (irregular heartbeats), high blood pressure, heart failure and atherosclerosis (plaque in the arteries).  It is important to reduce your risk by:   Keep blood pressure, cholesterol, and blood sugar at healthy levels   Smoking Cessation   Maintain a healthy weight  BMI 20-25   Eat a healthy diet  Plenty of fresh fruit, vegetables, and whole grains  Limit saturated fats, foods high in sodium, and added sugars  DASH and Mediterranean diet   Increase physical activity  Recommend moderate physically activity for 150 minutes per week/ 30 minutes a day for five days a week These can be broken up into three separate ten-minute sessions during the day.   Reduce Stress  Meditation, slow breathing exercises, yoga, coloring books  Dental visits twice a year

## 2021-02-17 ENCOUNTER — Ambulatory Visit: Payer: Medicaid Other | Admitting: Rheumatology

## 2021-02-23 ENCOUNTER — Encounter (HOSPITAL_COMMUNITY): Payer: Medicaid Other | Admitting: Physical Therapy

## 2021-03-08 ENCOUNTER — Other Ambulatory Visit: Payer: Self-pay | Admitting: Family Medicine

## 2021-03-08 DIAGNOSIS — K219 Gastro-esophageal reflux disease without esophagitis: Secondary | ICD-10-CM

## 2021-03-08 DIAGNOSIS — M48061 Spinal stenosis, lumbar region without neurogenic claudication: Secondary | ICD-10-CM

## 2021-03-08 DIAGNOSIS — R21 Rash and other nonspecific skin eruption: Secondary | ICD-10-CM

## 2021-03-14 ENCOUNTER — Other Ambulatory Visit: Payer: Self-pay | Admitting: Family Medicine

## 2021-03-14 DIAGNOSIS — M48061 Spinal stenosis, lumbar region without neurogenic claudication: Secondary | ICD-10-CM

## 2021-03-16 ENCOUNTER — Ambulatory Visit (HOSPITAL_COMMUNITY): Payer: Medicaid Other | Attending: Rheumatology | Admitting: Physical Therapy

## 2021-03-24 ENCOUNTER — Ambulatory Visit (INDEPENDENT_AMBULATORY_CARE_PROVIDER_SITE_OTHER): Payer: Medicaid Other | Admitting: Orthopaedic Surgery

## 2021-03-24 ENCOUNTER — Other Ambulatory Visit: Payer: Self-pay

## 2021-03-24 ENCOUNTER — Encounter: Payer: Self-pay | Admitting: Orthopaedic Surgery

## 2021-03-24 VITALS — Ht 63.0 in | Wt 254.0 lb

## 2021-03-24 DIAGNOSIS — M5136 Other intervertebral disc degeneration, lumbar region: Secondary | ICD-10-CM

## 2021-03-24 NOTE — Progress Notes (Signed)
? ?Office Visit Note ?  ?Patient: Selena Howard           ?Date of Birth: 1975/12/07           ?MRN: [id number] ?Visit Date: 03/24/2021 ?             ?Requested by: [name], DO ?[address] [place],  [place] [address] ?PCP: [name], DO ? ? ?Assessment & Plan: ?Visit Diagnoses:  ?1. Other intervertebral disc degeneration, lumbar region   ? ? ?Plan: She will look into pool exercising.  We discussed weight loss should improve some of her symptoms in her knees and feet as well as low back symptoms.  Reviewed previous radiographs that showed some degenerative facet changes at L5-S1.  She can follow-up if she develops increased radicular symptoms. ? ?Follow-Up Instructions: No follow-ups on file.  ? ?Orders:  ?No orders of the defined types were placed in this encounter. ? ?No orders of the defined types were placed in this encounter. ? ? ? ? Procedures: ?No procedures performed ? ? ?Clinical Data: ?No additional findings. ? ? ?Subjective: ?Chief Complaint  ?Patient presents with  ? Lower Back - Pain, Follow-up  ? ? ?HPI 46 year old female returns for follow-up with low back pain.  She is finished physical therapy and does not think she is got much improvement.  She states she was told she has some mild OA changes in her knee as well as feet and hands.  She has been on gabapentin and states recently she had gained some weight after laparoscopic hysterectomy.  She has a history of anxiety she is on Prozac.  She also takes vitamin the occasional tramadol.  No associated bowel or bladder symptoms.  Increased problems with the turning twisting ,sweeping ,mopping and using the vacuum. ? ?Review of Systems ?All the systems update unchanged from 01/27/2021 office visit. ? ?Objective: ?Vital Signs: Ht '5\' 3"'$  (1.6 m)   Wt 254 lb (115.2 kg)   BMI 44.99 kg/m?  ? ?Physical Exam ?Constitutional:   ?   Appearance: She is well-developed.  ?HENT:  ?   Head: Normocephalic.  ?   Right Ear: External ear normal.  ?    Left Ear: External ear normal. There is no impacted cerumen.  ?Eyes:  ?   Pupils: Pupils are equal, round, and reactive to light.  ?Neck:  ?   Thyroid: No thyromegaly.  ?   Trachea: No tracheal deviation.  ?Cardiovascular:  ?   Rate and Rhythm: Normal rate.  ?Pulmonary:  ?   Effort: Pulmonary effort is normal.  ?Abdominal:  ?   Palpations: Abdomen is soft.  ?Musculoskeletal:  ?   Cervical back: No rigidity.  ?Skin: ?   General: Skin is warm and dry.  ?Neurological:  ?   Mental Status: She is alert and oriented to person, place, and time.  ?Psychiatric:     ?   Behavior: Behavior normal.  ? ? ?Ortho Exam patient is ambulatory without limp.  She uses her arms and hands from getting to sitting to standing.  Negative logroll the hips knees reach full extension. ? ?Specialty Comments:  ?No specialty comments available. ? ?Imaging: ?No results found. ? ? ?PMFS History: ?Patient Active Problem List  ? Diagnosis Date Noted  ? Paresthesia 02/04/2021  ? Obstructive sleep apnea 02/04/2021  ? History of gastroesophageal reflux (GERD) 01/28/2021  ? Vitamin D deficiency 01/28/2021  ? History of anxiety 01/28/2021  ? Bilateral hand numbness 12/16/2020  ?  Other intervertebral disc degeneration, lumbar region 12/16/2020  ? ?Past Medical History:  ?Diagnosis Date  ? Anxiety   ? Arthritis   ? Endometriosis   ? now s/p partial hysterectomy 06/2020  ? 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  ?Family History  ?Problem Relation Age of Onset  ? Diabetes Mother   ? Liver disease Mother   ? COPD Father   ? Diabetes Father   ? Heart disease Maternal Grandmother   ? Cancer Maternal Grandfather   ? Cancer Paternal Grandmother   ? Diabetes Paternal Grandfather   ?  ?Past Surgical History:  ?Procedure Laterality Date  ? ABDOMINAL HYSTERECTOMY    ? partial, ovary sparing  ? CESAREAN SECTION    ? WISDOM TOOTH EXTRACTION Bilateral   ? ?Social History  ? ?Occupational History  ? Not on file  ?Tobacco Use  ? Smoking status: Former  ?  Packs/day: 0.50  ?   Years: 10.00  ?  Pack years: 5.00  ?  Types: Cigarettes  ?  Quit date: 06/02/2020  ?  Years since quitting: 0.8  ?  Passive exposure: Past  ? Smokeless tobacco: Never  ?Vaping Use  ? Vaping Use: Never used  ?Substance and Sexual Activity  ? Alcohol use: Not Currently  ?  Comment: rare  ? Drug use: Not Currently  ? Sexual activity: Not Currently  ? ? ? ? ? ? ?

## 2021-03-25 ENCOUNTER — Encounter: Payer: Self-pay | Admitting: Family Medicine

## 2021-03-25 ENCOUNTER — Ambulatory Visit: Payer: Medicaid Other | Admitting: Family Medicine

## 2021-03-25 VITALS — BP 115/74 | HR 74 | Temp 97.3°F | Ht 63.0 in | Wt 261.0 lb

## 2021-03-25 DIAGNOSIS — R0981 Nasal congestion: Secondary | ICD-10-CM | POA: Diagnosis not present

## 2021-03-25 DIAGNOSIS — J029 Acute pharyngitis, unspecified: Secondary | ICD-10-CM | POA: Diagnosis not present

## 2021-03-25 LAB — RAPID STREP SCREEN (MED CTR MEBANE ONLY): Strep Gp A Ag, IA W/Reflex: NEGATIVE

## 2021-03-25 LAB — CULTURE, GROUP A STREP

## 2021-03-25 MED ORDER — CHLORPHEN-PE-ACETAMINOPHEN 4-10-325 MG PO TABS: 1.0000 | ORAL_TABLET | Freq: Four times a day (QID) | ORAL | 0 refills | Status: DC | PRN

## 2021-03-25 MED ORDER — FLUTICASONE PROPIONATE 50 MCG/ACT NA SUSP: 2.0000 | Freq: Every day | NASAL | 2 refills | Status: DC

## 2021-03-25 NOTE — Progress Notes (Signed)
? ?Acute Office Visit ? ?Subjective:  ? ? Patient ID: Selena Howard, female    DOB: 1975-10-29, 46 y.o.   MRN: [id number] ? ?Chief Complaint  ?Patient presents with  ? Sore Throat  ? Nasal Congestion  ? ? ?HPI ?Patient is in today for congestin, postnasal drip, and sinus pressure x 2 days. She also reports a sore throat that woke up suddenly in the middle of the night last night. It felt like she was swallowing glass. Her sore throat has improved somewhat since. She denies cough, fever, chills, vomiting, diarrhea, body aches, chest pain, or shortness of breath. She has been taking dayquil and nyquil some improvement. She denies exposure to Covid, flu, or strep. She does have a retirement party for her elderly father to attend this weekend.  ? ? ?Past Medical History:  ?Diagnosis Date  ? Anxiety   ? Arthritis   ? Endometriosis   ? now s/p partial hysterectomy 06/2020  ? 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 ? ?Past Surgical History:  ?Procedure Laterality Date  ? ABDOMINAL HYSTERECTOMY    ? partial, ovary sparing  ? CESAREAN SECTION    ? WISDOM TOOTH EXTRACTION Bilateral   ? ? ?Family History  ?Problem Relation Age of Onset  ? Diabetes Mother   ? Liver disease Mother   ? COPD Father   ? Diabetes Father   ? Heart disease Maternal Grandmother   ? Cancer Maternal Grandfather   ? Cancer Paternal Grandmother   ? Diabetes Paternal Grandfather   ? ? ?Social History  ? ?Socioeconomic History  ? Marital status: Divorced  ?  Spouse name: Not on file  ? Number of children: 3  ? Years of education: 2  ? Highest education level: Not on file  ?Occupational History  ? Not on file  ?Tobacco Use  ? Smoking status: Former  ?  Packs/day: 0.50  ?  Years: 10.00  ?  Pack years: 5.00  ?  Types: Cigarettes  ?  Quit date: 06/02/2020  ?  Years since quitting: 0.8  ?  Passive exposure: Past  ? Smokeless tobacco: Never  ?Vaping Use  ? Vaping Use: Never used  ?Substance and Sexual Activity  ? Alcohol use: Not Currently  ?  Comment: rare  ? Drug use:  Not Currently  ? Sexual activity: Not Currently  ?Other Topics Concern  ? Not on file  ?Social History Narrative  ? Right handed  ? Caffeine use: no tea, Coffee-tries to drink decaf, diet sodas  ? Mother also sees me, [name]  ? ?Social Determinants of Health  ? ?Financial Resource Strain: Not on file  ?Food Insecurity: Not on file  ?Transportation Needs: Not on file  ?Physical Activity: Not on file  ?Stress: Not on file  ?Social Connections: Not on file  ?Intimate Partner Violence: Not on file  ? ? ?Outpatient Medications Prior to Visit  ?Medication Sig Dispense Refill  ? betamethasone valerate (VALISONE) 0.1 % cream APPLY TOPICALLY 2 (TWO) TIMES DAILY FOR 10-14 DAYS TO FOOT 30 g 1  ? Cholecalciferol (VITAMIN D3) 1.25 MG (50000 UT) CAPS Take 1 capsule by mouth once a week.    ? ciclopirox (PENLAC) 8 % solution Apply topically daily.    ? esomeprazole (NEXIUM) 40 MG capsule TAKE 1 CAPSULE (40 MG TOTAL) BY MOUTH DAILY. 30 capsule 1  ? FLUoxetine (PROZAC) 20 MG capsule Take 20 mg by mouth daily.    ? gabapentin (NEURONTIN) 300 MG capsule TAKE  2 CAPSULES BY MOUTH 3 TIMES DAILY. 180 capsule 1  ? traMADol (ULTRAM) 50 MG tablet Take 1 tablet (50 mg total) by mouth every 12 (twelve) hours as needed for severe pain. Put on file 30 tablet 0  ? DULoxetine (CYMBALTA) 30 MG capsule Take 1 capsule (30 mg total) by mouth daily. (Patient not taking: Reported on 03/25/2021) 30 capsule 3  ? ibuprofen (ADVIL) 800 MG tablet Take 800 mg by mouth every 8 (eight) hours as needed.    ? ?No facility-administered medications prior to visit.  ? ? ?Allergies  ?Allergen Reactions  ? Keflex [Cephalexin] Nausea And Vomiting  ? ? ?Review of Systems ?As per HPI.  ?   ?Objective:  ?  ?Physical Exam ?Vitals and nursing note reviewed.  ?Constitutional:   ?   General: She is not in acute distress. ?   Appearance: She is not toxic-appearing or diaphoretic.  ?HENT:  ?   Head: Normocephalic and atraumatic.  ?   Right Ear: Tympanic membrane and ear  canal normal.  ?   Left Ear: Tympanic membrane and ear canal normal.  ?   Nose: Congestion present.  ?   Mouth/Throat:  ?   Mouth: Mucous membranes are moist.  ?   Pharynx: Posterior oropharyngeal erythema present. No pharyngeal swelling, oropharyngeal exudate or uvula swelling.  ?   Tonsils: No tonsillar exudate or tonsillar abscesses.  ?Eyes:  ?   Conjunctiva/sclera: Conjunctivae normal.  ?   Pupils: Pupils are equal, round, and reactive to light.  ?Cardiovascular:  ?   Rate and Rhythm: Normal rate and regular rhythm.  ?   Heart sounds: Normal heart sounds. No murmur heard. ?Abdominal:  ?   General: There is no distension.  ?   Palpations: Abdomen is soft.  ?   Tenderness: There is no abdominal tenderness. There is no guarding.  ?Musculoskeletal:  ?   Cervical back: Neck supple.  ?Lymphadenopathy:  ?   Cervical: No cervical adenopathy.  ?Skin: ?   General: Skin is warm and dry.  ?Neurological:  ?   General: No focal deficit present.  ?   Mental Status: She is alert and oriented to person, place, and time.  ?Psychiatric:     ?   Mood and Affect: Mood normal.     ?   Behavior: Behavior normal.  ? ? ?BP 115/74   Pulse 74   Temp (!) 97.3 ?F (36.3 ?C) (Temporal)   Ht 5' 3"  (1.6 m)   Wt 261 lb (118.4 kg)   SpO2 97%   BMI 46.23 kg/m?  ?Wt Readings from Last 3 Encounters:  ?03/25/21 261 lb (118.4 kg)  ?03/24/21 254 lb (115.2 kg)  ?02/16/21 258 lb (117 kg)  ? ? ?Health Maintenance Due  ?Topic Date Due  ? COVID-19 Vaccine (1) Never done  ? ? ?There are no preventive care reminders to display for this patient. ? ? ?Lab Results  ?Component Value Date  ? TSH 2.24 01/28/2021  ? ?Lab Results  ?Component Value Date  ? WBC 5.8 11/30/2020  ? HGB 12.5 11/30/2020  ? HCT 37.8 11/30/2020  ? MCV 86 11/30/2020  ? PLT 220 11/30/2020  ? ?Lab Results  ?Component Value Date  ? NA 140 11/30/2020  ? K 4.1 11/30/2020  ? CO2 21 11/30/2020  ? GLUCOSE 91 11/30/2020  ? BUN 23 11/30/2020  ? CREATININE 1.07 (H) 11/30/2020  ? BILITOT 0.3  11/30/2020  ? ALKPHOS 71 11/30/2020  ? AST 15 11/30/2020  ?  ALT 13 11/30/2020  ? PROT 6.4 11/30/2020  ? ALBUMIN 4.0 11/30/2020  ? CALCIUM 8.9 11/30/2020  ? EGFR 65 11/30/2020  ? ?Lab Results  ?Component Value Date  ? CHOL 147 11/30/2020  ? ?Lab Results  ?Component Value Date  ? HDL 51 11/30/2020  ? ?Lab Results  ?Component Value Date  ? [place] 80 11/30/2020  ? ?Lab Results  ?Component Value Date  ? TRIG 86 11/30/2020  ? ?Lab Results  ?Component Value Date  ? CHOLHDL 2.9 11/30/2020  ? ?Lab Results  ?Component Value Date  ? HGBA1C 5.2 11/30/2020  ? ? ?   ?Assessment & Plan:  ? ?Lasonja was seen today for sore throat and nasal congestion. ? ?Diagnoses and all orders for this visit: ? ?Sore throat ?Congestion of nasal sinus ?Negative strep. Covid pending. Discussed viral URI. Can try norel AD- do not take tylenol or other decongestants with this. Add flonase. Discussed symptomatic care and return precautions.  ?-     Rapid Strep Screen ([facility] ONLY) ?-     Novel Coronavirus, NAA ([facility]) ?-     Chlorphen-PE-Acetaminophen 4-10-325 MG TABS; Take 1 tablet by mouth every 6 (six) hours as needed. ?-     fluticasone (FLONASE) 50 MCG/ACT nasal spray; Place 2 sprays into both nostrils daily. ? ?Return to office for new or worsening symptoms, or if symptoms persist.  ? ?The patient indicates understanding of these issues and agrees with the plan. ? ?Gwenlyn Perking, FNP ? ?

## 2021-03-25 NOTE — Patient Instructions (Signed)
Viral Respiratory Infection A respiratory infection is an illness that affects part of the respiratory system, such as the lungs, nose, or throat. A respiratory infection that is caused by a virus is called a viral respiratory infection. Common types of viral respiratory infections include: A cold. The flu (influenza). A respiratory syncytial virus (RSV) infection. What are the causes? This condition is caused by a virus. The virus may spread through contact with droplets or direct contact with infected people or their mucus or secretions. The virus may spread from person to person (is contagious). What are the signs or symptoms? Symptoms of this condition include: A stuffy or runny nose. A sore throat or cough. Shortness of breath or difficulty breathing. Yellow or green mucus (sputum). Other symptoms may include: A fever. Sweating or chills. Fatigue. Achy muscles. A headache. How is this diagnosed? This condition may be diagnosed based on: Your symptoms. A physical exam. Testing of secretions from the nose or throat. Chest X-ray. How is this treated? This condition may be treated with medicines, such as: Antiviral medicine. This may shorten the length of time a person has symptoms. Expectorants. These make it easier to cough up mucus. Decongestant nasal sprays. Acetaminophen or NSAIDs, such as ibuprofen, to relieve fever and pain. Antibiotic medicines are not prescribed for viral infections.This is because antibiotics are designed to kill bacteria. They do not kill viruses. Follow these instructions at home: Managing pain and congestion Take over-the-counter and prescription medicines only as told by your health care provider. If you have a sore throat, gargle with a mixture of salt and water 3-4 times a day or as needed. To make salt water, completely dissolve -1 tsp (3-6 g) of salt in 1 cup (237 mL) of warm water. Use nose drops made from salt water to ease congestion and  soften raw skin around your nose. Take 2 tsp (10 mL) of honey at bedtime to lessen coughing at night. Do not give honey to children who are younger than 1 year. Drink enough fluid to keep your urine pale yellow. This helps prevent dehydration and helps loosen up mucus. General instructions  Rest as much as possible. Do not drink alcohol. Do not use any products that contain nicotine or tobacco. These products include cigarettes, chewing tobacco, and vaping devices, such as e-cigarettes. If you need help quitting, ask your health care provider. Keep all follow-up visits. This is important. How is this prevented?   Get an annual flu shot. You may get the flu shot in late summer, fall, or winter. Ask your health care provider when you should get your flu shot. Avoid spreading your infection to other people. If you are sick: Wash your hands with soap and water often, especially after you cough or sneeze. Wash for at least 20 seconds. If soap and water are not available, use alcohol-based hand sanitizer. Cover your mouth when you cough. Cover your nose and mouth when you sneeze. Do not share cups or eating utensils. Clean commonly used objects often. Clean commonly touched surfaces. Stay home from work or school as told by your health care provider. Avoid contact with people who are sick during cold and flu season. This is generally fall and winter. Contact a health care provider if: Your symptoms last for 10 days or longer. Your symptoms get worse over time. You have severe sinus pain in your face or forehead. The glands in your jaw or neck become very swollen. You have shortness of breath. Get help right   away if you: Feel pain or pressure in your chest. Have trouble breathing. Faint or feel like you will faint. Have severe and persistent vomiting. Feel confused or disoriented. These symptoms may represent a serious problem that is an emergency. Do not wait to see if the symptoms will go  away. Get medical help right away. Call your local emergency services (911 in the [place] Do not drive yourself to the hospital. Summary A respiratory infection is an illness that affects part of the respiratory system, such as the lungs, nose, or throat. A respiratory infection that is caused by a virus is called a viral respiratory infection. Common types of viral respiratory infections include a cold, influenza, and respiratory syncytial virus (RSV) infection. Symptoms of this condition include a stuffy or runny nose, cough, fatigue, achy muscles, sore throat, and fevers or chills. Antibiotic medicines are not prescribed for viral infections. This is because antibiotics are designed to kill bacteria. They are not effective against viruses. This information is not intended to replace advice given to you by your health care provider. Make sure you discuss any questions you have with your health care provider. Document Revised: 03/25/2020 Document Reviewed: 03/25/2020 [employer] Patient Education  2022 [employer]  

## 2021-03-26 LAB — NOVEL CORONAVIRUS, NAA: SARS-CoV-2, NAA: NOT DETECTED

## 2021-03-30 ENCOUNTER — Ambulatory Visit (INDEPENDENT_AMBULATORY_CARE_PROVIDER_SITE_OTHER): Payer: Medicaid Other | Admitting: Neurology

## 2021-03-30 ENCOUNTER — Encounter: Payer: Medicaid Other | Admitting: Neurology

## 2021-03-30 ENCOUNTER — Other Ambulatory Visit: Payer: Self-pay

## 2021-03-30 DIAGNOSIS — R52 Pain, unspecified: Secondary | ICD-10-CM | POA: Diagnosis not present

## 2021-03-30 DIAGNOSIS — R202 Paresthesia of skin: Secondary | ICD-10-CM | POA: Diagnosis not present

## 2021-03-30 DIAGNOSIS — Z0289 Encounter for other administrative examinations: Secondary | ICD-10-CM

## 2021-03-30 MED ORDER — DULOXETINE HCL 60 MG PO CPEP: 60.0000 mg | ORAL_CAPSULE | Freq: Every day | ORAL | 11 refills | Status: DC

## 2021-03-30 NOTE — Progress Notes (Signed)
? ?No chief complaint on file. ? ? ? ? ?ASSESSMENT AND PLAN ? ?Selena Howard is a 45 y.o. female   ?Chronic diffuse body achy pain, multiple joints pain, upper and lower extremity paresthesia ? ? EMG nerve conduction study on March 30, 2021 showed no significant abnormality. ? X-ray of multiple joints showed osteoarthritis, will continue to work with her rheumatologist ? Suboptimal control with gabapentin 600 mg 3 times daily, add on Cymbalta 30 mg daily, also advised patient to heavy dose gabapentin before bedtime to help her sleep ? She complains of diffuse body pain, elevated ESR, C-reactive protein previously, will repeat again for possible polymyalgia rheumatica. ? ?At risk for obstructive sleep apnea ? Narrow oropharyngeal space, obesity, on schedule for sleep consultation ? ? ?DIAGNOSTIC DATA (LABS, IMAGING, TESTING) ?- I reviewed patient records, labs, notes, testing and imaging myself where available. ? ?X-ray of left f/right toot, hands, osteoarthritis,  ? 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 ? ?Lab in Jan 2023: ? ?MEDICAL HISTORY: ? ?Selena Howard is a 45-year-old female, seen in request by Dr. Deveshwar, Shaili, for evaluation of intermittent bilateral upper and lower extremity paresthesia, her primary care physician is Dr. Gottschalk, Ashly M, initial evaluation was on February 04, 2021 ? ?I reviewed and summarized the referring note. PMHx ?Anxiety ? ?She is reported long history of chronic multiple joints pain, intermittent bilateral hands and feet paresthesia, chronic low back pain, gradually getting worse over the past few years, not developed bilateral low back pain radiating pain to bilateral lower extremity, and bilateral foot, she complains of difficulty walking mainly attributed to her knee pain, foot pain, and obesity, ? ?She denies bowel or bladder incontinence ? ?She was seen by rheumatologist Dr.  Deveshwar, extensive laboratory evaluations, normal or negative cyclic citrul peptide antibody, rheumatoid factor, TSH, CPK, CMP, lipid panel, CBC, thyroid functional test, random insulin, ANA, mild elevated ESR 31, C-reactive protein 19 ? ?Update April 04, 2021: ?She returns for electrodiagnostic study today, which was essentially normal, there is no evidence of large fiber peripheral neuropathy ? ?Reviewed extensive laboratory evaluation in 2022, which showed normal negative CBC, TSH, free T4, random insulin, ANA, lipid panel, CPK, TSH, rheumatoid factor, C-reactive protein was elevated 19, ESR was 31 ? ? ?PHYSICAL EXAM: ?  ? ?PHYSICAL EXAMNIATION: ? ?Gen: NAD, conversant, well nourised, well groomed                     ?Cardiovascular: Regular rate rhythm, no peripheral edema, warm, nontender. ?Eyes: Conjunctivae clear without exudates or hemorrhage ?Neck: Supple, no carotid bruits. ?Pulmonary: Clear to auscultation bilaterally  ? ?NEUROLOGICAL EXAM: ? ?MENTAL STATUS: ?Speech/cognition: ?Awake, alert, oriented to history taking and casual conversation ?  ?CRANIAL NERVES: ?CN II: Visual fields are full to confrontation. Pupils are round equal and briskly reactive to light. ?CN III, IV, VI: extraocular movement are normal. No ptosis. ?CN V: Facial sensation is intact to light touch ?CN VII: Face is symmetric with normal eye closure  ?CN VIII: Hearing is normal to causal conversation. ?CN IX, X: Phonation is normal. ?CN XI: Head turning and shoulder shrug are intact ?CN XII: Narrow oropharyngeal space ? ?MOTOR: ?There is no pronator drift of out-stretched arms. Muscle bulk and tone are normal. Muscle strength is normal. ? ?REFLEXES: ?Reflexes are 1  and symmetric at the biceps, triceps, knees, and absent at ankles. Plantar responses are flexor. ? ?SENSORY: ?Intact to light touch,   pinprick and vibratory sensation are intact in fingers and toes. ? ?COORDINATION: ?There is no trunk or limb dysmetria  noted. ? ?GAIT/STANCE: Need push-up to get up from seated position, mildly antalgic, limited by her big body habitus ? ?REVIEW OF SYSTEMS:  ?Full 14 system review of systems performed and notable only for as above ?All other review of systems were negative. ? ? ?ALLERGIES: ?Allergies  ?Allergen Reactions  ? Keflex [Cephalexin] Nausea And Vomiting  ? ? ?HOME MEDICATIONS: ?Current Outpatient Medications  ?Medication Sig Dispense Refill  ? betamethasone valerate (VALISONE) 0.1 % cream APPLY TOPICALLY 2 (TWO) TIMES DAILY FOR 10-14 DAYS TO FOOT 30 g 1  ? Chlorphen-PE-Acetaminophen 4-10-325 MG TABS Take 1 tablet by mouth every 6 (six) hours as needed. 20 tablet 0  ? Cholecalciferol (VITAMIN D3) 1.25 MG (50000 UT) CAPS Take 1 capsule by mouth once a week.    ? ciclopirox (PENLAC) 8 % solution Apply topically daily.    ? DULoxetine (CYMBALTA) 30 MG capsule Take 1 capsule (30 mg total) by mouth daily. (Patient not taking: Reported on 03/25/2021) 30 capsule 3  ? esomeprazole (NEXIUM) 40 MG capsule TAKE 1 CAPSULE (40 MG TOTAL) BY MOUTH DAILY. 30 capsule 1  ? FLUoxetine (PROZAC) 20 MG capsule Take 20 mg by mouth daily.    ? fluticasone (FLONASE) 50 MCG/ACT nasal spray Place 2 sprays into both nostrils daily. 16 g 2  ? gabapentin (NEURONTIN) 300 MG capsule TAKE 2 CAPSULES BY MOUTH 3 TIMES DAILY. 180 capsule 1  ? traMADol (ULTRAM) 50 MG tablet Take 1 tablet (50 mg total) by mouth every 12 (twelve) hours as needed for severe pain. Put on file 30 tablet 0  ? ?No current facility-administered medications for this visit.  ? ? ?PAST MEDICAL HISTORY: ?Past Medical History:  ?Diagnosis Date  ? Anxiety   ? Arthritis   ? Endometriosis   ? now s/p partial hysterectomy 06/2020  ? 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 ? ?PAST SURGICAL HISTORY: ?Past Surgical History:  ?Procedure Laterality Date  ? ABDOMINAL HYSTERECTOMY    ? partial, ovary sparing  ? CESAREAN SECTION    ? WISDOM TOOTH EXTRACTION Bilateral   ? ? ?FAMILY HISTORY: ?Family History   ?Problem Relation Age of Onset  ? Diabetes Mother   ? Liver disease Mother   ? COPD Father   ? Diabetes Father   ? Heart disease Maternal Grandmother   ? Cancer Maternal Grandfather   ? Cancer Paternal Grandmother   ? Diabetes Paternal Grandfather   ? ? ?SOCIAL HISTORY: ?Social History  ? ?Socioeconomic History  ? Marital status: Divorced  ?  Spouse name: Not on file  ? Number of children: 3  ? Years of education: 22  ? Highest education level: Not on file  ?Occupational History  ? Not on file  ?Tobacco Use  ? Smoking status: Former  ?  Packs/day: 0.50  ?  Years: 10.00  ?  Pack years: 5.00  ?  Types: Cigarettes  ?  Quit date: 06/02/2020  ?  Years since quitting: 0.8  ?  Passive exposure: Past  ? Smokeless tobacco: Never  ?Vaping Use  ? Vaping Use: Never used  ?Substance and Sexual Activity  ? Alcohol use: Not Currently  ?  Comment: rare  ? Drug use: Not Currently  ? Sexual activity: Not Currently  ?Other Topics Concern  ? Not on file  ?Social History Narrative  ? Right handed  ? Caffeine use: no tea,  Coffee-tries to drink decaf, diet sodas  ? Mother also sees me, Donna Robinette  ? ?Social Determinants of Health  ? ?Financial Resource Strain: Not on file  ?Food Insecurity: Not on file  ?Transportation Needs: Not on file  ?Physical Activity: Not on file  ?Stress: Not on file  ?Social Connections: Not on file  ?Intimate Partner Violence: Not on file  ? ? ? ? ?Yijun Yan, M.D. Ph.D. ? ?[facility] ?[address], [address] ?[place], [place] [address] ?Ph: [phone] ?Fax: [phone] ? ?CC:  Gottschalk, Ashly M, DO ?[address] ?[place],  [place] [address]  Gottschalk, Ashly M, DO   ?

## 2021-03-31 ENCOUNTER — Encounter: Payer: Self-pay | Admitting: Neurology

## 2021-03-31 ENCOUNTER — Telehealth: Payer: Self-pay | Admitting: Neurology

## 2021-03-31 ENCOUNTER — Ambulatory Visit: Payer: Medicaid Other | Admitting: Neurology

## 2021-03-31 ENCOUNTER — Telehealth: Payer: Self-pay

## 2021-03-31 VITALS — BP 142/85 | HR 73 | Ht 63.0 in | Wt 261.5 lb

## 2021-03-31 DIAGNOSIS — R0683 Snoring: Secondary | ICD-10-CM | POA: Diagnosis not present

## 2021-03-31 DIAGNOSIS — G2581 Restless legs syndrome: Secondary | ICD-10-CM | POA: Diagnosis not present

## 2021-03-31 DIAGNOSIS — R6889 Other general symptoms and signs: Secondary | ICD-10-CM | POA: Insufficient documentation

## 2021-03-31 DIAGNOSIS — R519 Headache, unspecified: Secondary | ICD-10-CM

## 2021-03-31 DIAGNOSIS — R0681 Apnea, not elsewhere classified: Secondary | ICD-10-CM

## 2021-03-31 DIAGNOSIS — F409 Phobic anxiety disorder, unspecified: Secondary | ICD-10-CM

## 2021-03-31 DIAGNOSIS — M542 Cervicalgia: Secondary | ICD-10-CM | POA: Insufficient documentation

## 2021-03-31 DIAGNOSIS — R5382 Chronic fatigue, unspecified: Secondary | ICD-10-CM

## 2021-03-31 DIAGNOSIS — F5105 Insomnia due to other mental disorder: Secondary | ICD-10-CM | POA: Diagnosis not present

## 2021-03-31 DIAGNOSIS — K219 Gastro-esophageal reflux disease without esophagitis: Secondary | ICD-10-CM | POA: Insufficient documentation

## 2021-03-31 DIAGNOSIS — G4719 Other hypersomnia: Secondary | ICD-10-CM

## 2021-03-31 LAB — SEDIMENTATION RATE: Sed Rate: 40 mm/hr — ABNORMAL HIGH (ref 0–32)

## 2021-03-31 LAB — C-REACTIVE PROTEIN: CRP: 33 mg/L — ABNORMAL HIGH (ref 0–10)

## 2021-03-31 NOTE — Progress Notes (Signed)
? ? ?SLEEP MEDICINE CLINIC ?  ? ?Provider:  Larey Seat, MD  ?Primary Care Physician:  [name], DO ?[place] [address]  ? ?  ?Referring Provider: [name], Do ?[address] [place],  [place] [address]  ? ? ?Active Patient [name], seen yesterday for  Paresthesias.  ?  ?  ?    ?Chief Complaint according to patient   ?Patient presents with:  ?  ? New Patient (Initial Visit)  ?     ?  ?  ?HISTORY OF PRESENT ILLNESS:  ?Selena Howard is a 46 y.o.  female patient and is seen on 03/31/2021 from PCP [name], DO for a sleep consultation.  ?Chief concern according to patient :  " I am always fatigued, all day long and at night I can't sleep well, sometimes taking hours to fall asleep.  ?  ?I have the pleasure of seeing Zahniya Zellars today, a right -handed White or Caucasian female who  has a past medical history of Anxiety, Arthritis, Endometriosis, and Spinal stenosis at L4-L5 level. Anxiety. Hysterectomy June 2022,  ?She has reported a long history of chronic multiple joints pain, intermittent bilateral hands and feet paresthesia, chronic low back pain, gradually getting worse over the past few years, not developed bilateral low back pain radiating pain to bilateral lower extremity, and bilateral foot, she complains of difficulty walking mainly attributed to her knee pain, foot pain, and obesity, cyclic sleeplessness, daytime sleepiness. She gained 30 pounds since hysterectomy, anemic before due to fibroids- but she felt worse in terms of Hairloss, and skin dryness.  ?  ?  ?Sleep relevant medical history: no Nocturia, no Sleep walking,  no ENT surgery, trauma, or TBI.  ?  ?Family medical /sleep history: father on BiPAP, mother on CPAP. ?  ?Social history:  Patient grow up in [place], [place],  is unemployed, last employed at [employer], working as a Oncologist  5 years ago and lives in a household with her parents. ? Family status is single , she helps her  elderly parents. She slept better while working an early shift !  ? ?Tobacco use- quit last year .  ETOH use; none ,  ?Caffeine intake : none . ?Regular exercise : none .   ? ? ?Sleep habits are as follows: The patient's dinner time is between 5-7 PM. The patient goes to bed at 9-11 PM and continues to stay awake for 2-3 hours, then sleep for 1-2 hours intervals , no bathroom breaks, ?The preferred sleep position is prone and laterally , with the support of 1-2 pillows. Her children stated she snores.  ?Dreams are reportedly frequent/vivid - often unsettling, crazy. She dreams about her brother who is deceased after an overdose.  ?   ?6-8.30 AM is the usual rise time.  ?The patient wakes up spontaneously.  ?She reports not feeling refreshed or restored in AM, with symptoms such as dry mouth, morning headaches, and residual fatigue. ? Naps are taken infrequently, she may fall asleep in he recliner- rarely- ? ?  ?Review of Systems: ?Out of a complete 14 system review, the patient complains of only the following symptoms, and all other reviewed systems are negative.:  ?Fatigue, sleepiness , snoring, fragmented sleep, Insomnia , sleep initiation-  ?  ?How likely are you to doze in the following situations: ?0 = not likely, 1 = slight chance, 2 = moderate chance, 3 = high chance ?  ?  Sitting and Reading? ?Watching Television? ?Sitting inactive in a public place (theater or meeting)? ?As a passenger in a car for an hour without a break? ?Lying down in the afternoon when circumstances permit? ?Sitting and talking to someone? ?Sitting quietly after lunch without alcohol? ?In a car, while stopped for a few minutes in traffic? ?  ?Total = 9-10/ 24 points  ? FSS endorsed at 41/ 63 points.  ? ?Social History  ? ?Socioeconomic History  ? Marital status: Divorced  ?  Spouse name: Not on file  ? Number of children: 3  ? Years of education: 59  ? Highest education level: High school graduate  ?Occupational History  ? Not on file   ?Tobacco Use  ? Smoking status: Former  ?  Packs/day: 0.50  ?  Years: 10.00  ?  Pack years: 5.00  ?  Types: Cigarettes  ?  Quit date: 06/02/2020  ?  Years since quitting: 0.8  ?  Passive exposure: Past  ? Smokeless tobacco: Never  ?Vaping Use  ? Vaping Use: Never used  ?Substance and Sexual Activity  ? Alcohol use: Not Currently  ?  Comment: rare  ? Drug use: Not Currently  ? Sexual activity: Not Currently  ?Other Topics Concern  ? Not on file  ?Social History Narrative  ? Right handed  ? Caffeine use: no tea, Coffee-tries to drink decaf, diet sodas  ? Mother also sees me, [name]  ? ?Social Determinants of Health  ? ?Financial Resource Strain: Not on file  ?Food Insecurity: Not on file  ?Transportation Needs: Not on file  ?Physical Activity: Not on file  ?Stress: Not on file  ?Social Connections: Not on file  ? ? ?Family History  ?Problem Relation Age of Onset  ? Diabetes Mother   ? Liver disease Mother   ? COPD Father   ? Diabetes Father   ? Heart disease Maternal Grandmother   ? Cancer Maternal Grandfather   ? Cancer Paternal Grandmother   ? Diabetes Paternal Grandfather   ? ? ?Past Medical History:  ?Diagnosis Date  ? Anxiety   ? Arthritis   ? Endometriosis   ? now s/p partial hysterectomy 06/2020  ? 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 ? ?Past Surgical History:  ?Procedure Laterality Date  ? ABDOMINAL HYSTERECTOMY    ? partial, ovary sparing  ? CESAREAN SECTION    ? WISDOM TOOTH EXTRACTION Bilateral   ?  ? ?Current Outpatient Medications on File Prior to Visit  ?Medication Sig Dispense Refill  ? betamethasone valerate (VALISONE) 0.1 % cream APPLY TOPICALLY 2 (TWO) TIMES DAILY FOR 10-14 DAYS TO FOOT 30 g 1  ? Chlorphen-PE-Acetaminophen 4-10-325 MG TABS Take 1 tablet by mouth every 6 (six) hours as needed. 20 tablet 0  ? Cholecalciferol (VITAMIN D3) 1.25 MG (50000 UT) CAPS Take 1 capsule by mouth once a week.    ? ciclopirox (PENLAC) 8 % solution Apply topically daily.    ? DULoxetine (CYMBALTA) 30 MG capsule  Take 1 capsule (30 mg total) by mouth daily. (Patient taking differently: Take 60 mg by mouth daily.) 30 capsule 3  ? esomeprazole (NEXIUM) 40 MG capsule TAKE 1 CAPSULE (40 MG TOTAL) BY MOUTH DAILY. 30 capsule 1  ? FLUoxetine (PROZAC) 20 MG capsule Take 20 mg by mouth daily.    ? fluticasone (FLONASE) 50 MCG/ACT nasal spray Place 2 sprays into both nostrils daily. 16 g 2  ? gabapentin (NEURONTIN) 300 MG capsule TAKE 2 CAPSULES  BY MOUTH 3 TIMES DAILY. 180 capsule 1  ? traMADol (ULTRAM) 50 MG tablet Take 1 tablet (50 mg total) by mouth every 12 (twelve) hours as needed for severe pain. Put on file 30 tablet 0  ? ?No current facility-administered medications on file prior to visit.  ? ? ?Allergies  ?Allergen Reactions  ? Keflex [Cephalexin] Nausea And Vomiting  ? ? ?Physical exam: ? ?Today's Vitals  ? 03/31/21 1122  ?BP: (!) 142/85  ?Pulse: 73  ?Weight: 261 lb 8 oz (118.6 kg)  ?Height: '5\' 3"'$  (1.6 m)  ? ?Body mass index is 46.32 kg/m?.  ? ?Wt Readings from Last 3 Encounters:  ?03/31/21 261 lb 8 oz (118.6 kg)  ?03/25/21 261 lb (118.4 kg)  ?03/24/21 254 lb (115.2 kg)  ?  ? ?Ht Readings from Last 3 Encounters:  ?03/31/21 '5\' 3"'$  (1.6 m)  ?03/25/21 '5\' 3"'$  (1.6 m)  ?03/24/21 '5\' 3"'$  (1.6 m)  ?  ?  ?General: The patient is awake, alert and appears not in acute distress. The patient is well groomed. ?Head: Normocephalic, atraumatic. Neck is supple ?. Mallampati 2,  ?neck circumference:16 inches . Nasal airflow  patent.  Retrognathia is seen. Small lower jaw. Irregular teeth.  ?Dental status: biological  ?Cardiovascular:  Regular rate and cardiac rhythm by pulse,  without distended neck veins. ?Respiratory: Lungs are clear to auscultation.  ?Skin:  tattooed- and   evidence of ankle edema, foot  rash.  ?Trunk: The patient's posture is erect. ?  ?Neurologic exam : ?The patient is awake and alert, oriented to place and time.   ?Memory subjective described as intact.  ?Attention span & concentration ability appears normal.  ?Speech is  fluent,  without  dysarthria, dysphonia or aphasia.  ?Mood and affect are appropriate. ?  ?Cranial nerves: no loss of smell or taste reported  ?Pupils are equal and briskly reactive to light. Funduscopic exa

## 2021-03-31 NOTE — Telephone Encounter (Signed)
Please call patient, laboratory evaluation showed elevated ESR, C-reactive protein, suggestive of systemic inflammatory process ? ?She is already under the care of rheumatologist Dr. Estanislado Pandy, most recent visit January 28, 2021, diagnosed with underlying fibromyalgia, chronic fatigue ? ?Hope Cymbalta will help her symptoms better ? ?I have ordered EMG nerve conduction study, please put her on my schedule, performed Wednesday morning  ? ? ?

## 2021-03-31 NOTE — Telephone Encounter (Signed)
I returned patient's call.  She states she has been having increased joint swelling.  She has an appointment with [place] on April 18.  She is okay to wait until the follow-up appointment.

## 2021-03-31 NOTE — Telephone Encounter (Addendum)
I called Selena Howard and relayed lab results. Selena Howard verbalized understanding/appreciation for the call. Selena Howard reports she has not started the duloxetine yet but plans to today. She will keep [place] up to date on how she is doing. EMG was completed yesterday ( 03/30/21) ?

## 2021-03-31 NOTE — Telephone Encounter (Signed)
Patient called stating she received a call from Dr. Krista Blue, neurology office stating her SED rate and C-reactive protein labs were elevated and to contact Dr. Estanislado Pandy.   ?

## 2021-03-31 NOTE — Patient Instructions (Signed)
1)  thus far unexplained dysesthesias,  as well as feeling achy and sore, stiff and swollen.  ?2) sleep initiation insomnia is usually psychologic in origin.  ?3)She reports RLS, that may contribute to her prolonged sleep latency  ?4)  excessive daytime sleepiness is more fatigue,  ?OSA risk factors; BMI, she snores,too .  ?  ? My Plan is to proceed with: ?  ?1) PSG or HST for apnea work up. Prefer PSG for evaluation of RLS with Possible PLMs.  ?2) sleep hygiene-needs an  established routine rise time in AM. I suggest 8 AM to rise. ?Melatonin is allowed.  5 mg or less.  ?3) no screens or blue light for 45 minutes before bedtime. Drink water during the day, expose yourself to daylight in AM.  ?  ?I would like to thank Marcial Pacas, MD , PhD,  ?and [name] M, Do ? ?Insomnia ?Insomnia is a sleep disorder that makes it difficult to fall asleep or stay asleep. Insomnia can cause fatigue, low energy, difficulty concentrating, mood swings, and poor performance at work or school. ?There are three different ways to classify insomnia: ?Difficulty falling asleep. ?Difficulty staying asleep. ?Waking up too early in the morning. ?Any type of insomnia can be long-term (chronic) or short-term (acute). Both are common. Short-term insomnia usually lasts for three months or less. Chronic insomnia occurs at least three times a week for longer than three months. ?What are the causes? ?Insomnia may be caused by another condition, situation, or substance, such as: ?Anxiety. ?Certain medicines. ?Gastroesophageal reflux disease (GERD) or other gastrointestinal conditions. ?Asthma or other breathing conditions. ?Restless legs syndrome, sleep apnea, or other sleep disorders. ?Chronic pain. ?Menopause. ?Stroke. ?Abuse of alcohol, tobacco, or illegal drugs. ?Mental health conditions, such as depression. ?Caffeine. ?Neurological disorders, such as Alzheimer's disease. ?An overactive thyroid (hyperthyroidism). ?Sometimes, the cause of  insomnia may not be known. ?What increases the risk? ?Risk factors for insomnia include: ?Gender. Women are affected more often than men. ?Age. Insomnia is more common as you get older. ?Stress. ?Lack of exercise. ?Irregular work schedule or working night shifts. ?Traveling between different time zones. ?Certain medical and mental health conditions. ?What are the signs or symptoms? ?If you have insomnia, the main symptom is having trouble falling asleep or having trouble staying asleep. This may lead to other symptoms, such as: ?Feeling fatigued or having low energy. ?Feeling nervous about going to sleep. ?Not feeling rested in the morning. ?Having trouble concentrating. ?Feeling irritable, anxious, or depressed. ?How is this diagnosed? ?This condition may be diagnosed based on: ?Your symptoms and medical history. Your health care provider may ask about: ?Your sleep habits. ?Any medical conditions you have. ?Your mental health. ?A physical exam. ?How is this treated? ?Treatment for insomnia depends on the cause. Treatment may focus on treating an underlying condition that is causing insomnia. Treatment may also include: ?Medicines to help you sleep. ?Counseling or therapy. ?Lifestyle adjustments to help you sleep better. ?Follow these instructions at home: ?Eating and drinking ? ?Limit or avoid alcohol, caffeinated beverages, and cigarettes, especially close to bedtime. These can disrupt your sleep. ?Do not eat a large meal or eat spicy foods right before bedtime. This can lead to digestive discomfort that can make it hard for you to sleep. ?Sleep habits ? ?Keep a sleep diary to help you and your health care provider figure out what could be causing your insomnia. Write down: ?When you sleep. ?When you wake up during the night. ?How well you  sleep. ?How rested you feel the next day. ?Any side effects of medicines you are taking. ?What you eat and drink. ?Make your bedroom a dark, comfortable place where it is easy  to fall asleep. ?Put up shades or blackout curtains to block light from outside. ?Use a white noise machine to block noise. ?Keep the temperature cool. ?Limit screen use before bedtime. This includes: ?Watching TV. ?Using your smartphone, tablet, or computer. ?Stick to a routine that includes going to bed and waking up at the same times every day and night. This can help you fall asleep faster. Consider making a quiet activity, such as reading, part of your nighttime routine. ?Try to avoid taking naps during the day so that you sleep better at night. ?Get out of bed if you are still awake after 15 minutes of trying to sleep. Keep the lights down, but try reading or doing a quiet activity. When you feel sleepy, go back to bed. ?General instructions ?Take over-the-counter and prescription medicines only as told by your health care provider. ?Exercise regularly, as told by your health care provider. Avoid exercise starting several hours before bedtime. ?Use relaxation techniques to manage stress. Ask your health care provider to suggest some techniques that may work well for you. These may include: ?Breathing exercises. ?Routines to release muscle tension. ?Visualizing peaceful scenes. ?Make sure that you drive carefully. Avoid driving if you feel very sleepy. ?Keep all follow-up visits as told by your health care provider. This is important. ?Contact a health care provider if: ?You are tired throughout the day. ?You have trouble in your daily routine due to sleepiness. ?You continue to have sleep problems, or your sleep problems get worse. ?Get help right away if: ?You have serious thoughts about hurting yourself or someone else. ?If you ever feel like you may hurt yourself or others, or have thoughts about taking your own life, get help right away. You can go to your nearest emergency department or call: ?Your local emergency services (911 in the [place] ?A suicide crisis helpline, such as the [place] at 5856535615 or 988 in the [place]. This is open 24 hours a day. ?Summary ?Insomnia is a sleep disorder that makes it difficult to fall asleep or stay asleep. ?Insomnia can be long-term (chronic) or short-term (acute). ?Treatment for insomnia depends on the cause. Treatment may focus on treating an underlying condition that is causing insomnia. ?Keep a sleep diary to help you and your health care provider figure out what could be causing your insomnia. ?This information is not intended to replace advice given to you by your health care provider. Make sure you discuss any questions you have with your health care provider. ?Document Revised: 07/14/2020 Document Reviewed: 10/30/2019 ?Elsevier Patient Education ? [place]. ? ?

## 2021-04-01 ENCOUNTER — Ambulatory Visit: Payer: Medicaid Other | Admitting: Rheumatology

## 2021-04-01 ENCOUNTER — Encounter: Payer: Self-pay | Admitting: Family Medicine

## 2021-04-01 ENCOUNTER — Ambulatory Visit: Payer: Medicaid Other | Admitting: Family Medicine

## 2021-04-01 VITALS — BP 110/74 | HR 89 | Temp 97.4°F | Ht 63.0 in | Wt 259.0 lb

## 2021-04-01 DIAGNOSIS — J988 Other specified respiratory disorders: Secondary | ICD-10-CM | POA: Diagnosis not present

## 2021-04-01 DIAGNOSIS — B9689 Other specified bacterial agents as the cause of diseases classified elsewhere: Secondary | ICD-10-CM | POA: Diagnosis not present

## 2021-04-01 MED ORDER — AZITHROMYCIN 250 MG PO TABS: ORAL_TABLET | ORAL | 0 refills | Status: DC

## 2021-04-01 NOTE — Progress Notes (Signed)
? ?Assessment & Plan:  ?1. Bacterial respiratory infection ?Education provided on respiratory infections. ?- azithromycin (ZITHROMAX Z-PAK) 250 MG tablet; Take 2 tablets (500 mg) PO today, then 1 tablet (250 mg) PO daily x4 days.  Dispense: 6 tablet; Refill: 0 ? ? ?Follow up plan: Return if symptoms worsen or fail to improve. ? ?[name], MSN, APRN, FNP-C ?[place] ? ?Subjective:  ? ?Patient ID: Selena Howard, female    DOB: 18-Sep-1975, 46 y.o.   MRN: [id number] ? ?HPI: ?Selena Howard is a 46 y.o. female presenting on 04/01/2021 for Cough, Headache, and Nasal Congestion (Patient was seen on 3/24 and feels worse now ) ? ?Patient complains of cough, head/chest congestion, headache, sore throat, facial pain/pressure, and postnasal drainage. Onset of symptoms was 9 days ago, gradually worsening since that time. She is drinking plenty of fluids. Evaluation to date: seen previously and thought to have a viral URI; COVID and strep were negative at that time. Treatment to date: antihistamines and nasal steroids. She does not smoke. ? ? ?ROS: Negative unless specifically indicated above in HPI.  ? ?Relevant past medical history reviewed and updated as indicated.  ? ?Allergies and medications reviewed and updated. ? ? ?Current Outpatient Medications:  ?  betamethasone valerate (VALISONE) 0.1 % cream, APPLY TOPICALLY 2 (TWO) TIMES DAILY FOR 10-14 DAYS TO FOOT, Disp: 30 g, Rfl: 1 ?  Chlorphen-PE-Acetaminophen 4-10-325 MG TABS, Take 1 tablet by mouth every 6 (six) hours as needed., Disp: 20 tablet, Rfl: 0 ?  Cholecalciferol (VITAMIN D3) 1.25 MG (50000 UT) CAPS, Take 1 capsule by mouth once a week., Disp: , Rfl:  ?  ciclopirox (PENLAC) 8 % solution, Apply topically daily., Disp: , Rfl:  ?  DULoxetine (CYMBALTA) 30 MG capsule, Take 1 capsule (30 mg total) by mouth daily. (Patient taking differently: Take 60 mg by mouth daily.), Disp: 30 capsule, Rfl: 3 ?  esomeprazole (NEXIUM) 40 MG capsule, TAKE 1  CAPSULE (40 MG TOTAL) BY MOUTH DAILY., Disp: 30 capsule, Rfl: 1 ?  FLUoxetine (PROZAC) 20 MG capsule, Take 20 mg by mouth daily., Disp: , Rfl:  ?  fluticasone (FLONASE) 50 MCG/ACT nasal spray, Place 2 sprays into both nostrils daily., Disp: 16 g, Rfl: 2 ?  gabapentin (NEURONTIN) 300 MG capsule, TAKE 2 CAPSULES BY MOUTH 3 TIMES DAILY., Disp: 180 capsule, Rfl: 1 ?  traMADol (ULTRAM) 50 MG tablet, Take 1 tablet (50 mg total) by mouth every 12 (twelve) hours as needed for severe pain. Put on file, Disp: 30 tablet, Rfl: 0 ? ?Allergies  ?Allergen Reactions  ? Keflex [Cephalexin] Nausea And Vomiting  ? ? ?Objective:  ? ?BP 110/74   Pulse 89   Temp (!) 97.4 ?F (36.3 ?C) (Temporal)   Ht '5\' 3"'$  (1.6 m)   Wt 259 lb (117.5 kg)   SpO2 94%   BMI 45.88 kg/m?   ? ?Physical Exam ?Vitals reviewed.  ?Constitutional:   ?   General: She is not in acute distress. ?   Appearance: Normal appearance. She is not ill-appearing, toxic-appearing or diaphoretic.  ?HENT:  ?   Head: Normocephalic and atraumatic.  ?   Right Ear: Tympanic membrane, ear canal and external ear normal. There is no impacted cerumen.  ?   Left Ear: Tympanic membrane, ear canal and external ear normal. There is no impacted cerumen.  ?   Nose: No congestion or rhinorrhea.  ?   Right Sinus: Maxillary sinus tenderness present. No frontal sinus tenderness.  ?  Left Sinus: Maxillary sinus tenderness present. No frontal sinus tenderness.  ?   Mouth/Throat:  ?   Mouth: Mucous membranes are moist.  ?   Pharynx: Oropharynx is clear. No oropharyngeal exudate or posterior oropharyngeal erythema.  ?Eyes:  ?   General: No scleral icterus.    ?   Right eye: No discharge.     ?   Left eye: No discharge.  ?   Conjunctiva/sclera: Conjunctivae normal.  ?Cardiovascular:  ?   Rate and Rhythm: Normal rate and regular rhythm.  ?   Heart sounds: Normal heart sounds. No murmur heard. ?  No friction rub. No gallop.  ?Pulmonary:  ?   Effort: Pulmonary effort is normal. No respiratory  distress.  ?   Breath sounds: Normal breath sounds. No stridor. No wheezing, rhonchi or rales.  ?Musculoskeletal:     ?   General: Normal range of motion.  ?   Cervical back: Normal range of motion.  ?Lymphadenopathy:  ?   Cervical: No cervical adenopathy.  ?Skin: ?   General: Skin is warm and dry.  ?   Capillary Refill: Capillary refill takes less than 2 seconds.  ?Neurological:  ?   General: No focal deficit present.  ?   Mental Status: She is alert and oriented to person, place, and time. Mental status is at baseline.  ?Psychiatric:     ?   Mood and Affect: Mood normal.     ?   Behavior: Behavior normal.     ?   Thought Content: Thought content normal.     ?   Judgment: Judgment normal.  ? ? ? ? ? ? ?

## 2021-04-04 ENCOUNTER — Telehealth: Payer: Self-pay | Admitting: Family Medicine

## 2021-04-04 NOTE — Telephone Encounter (Signed)
Pt aware of recommendations

## 2021-04-04 NOTE — Telephone Encounter (Signed)
She is scheduled to see Dr. Estanislado Pandy on April 19 2021, with increased ESR C-reactive protein, worsening diffuse body achy pain,?  Possible polymyalgia rheumatica, I have forwarded the lab to Dr.  Estanislado Pandy, will ask her advice on low-dose of steroid treatment.   ?

## 2021-04-04 NOTE — Telephone Encounter (Signed)
Called requesting to speak with nurse regarding blood work she had done for another providers office. Wants to make sure PCP got the results and had questions.  ?

## 2021-04-04 NOTE — Procedures (Signed)
? ? ? ?   ?Full Name: Selena Howard Gender: Female ?MRN #: [id number] Date of Birth: November 08, 1975 ?   ?Visit Date: 03/30/2021 07:57 ?Age: 46 Years ?Examining Physician: Marcial Pacas, MD  ?Referring Physician: Marcial Pacas, MD ?Height: 5 feet 3 inch ?Patient History: 46 year old female, complains of bilateral hands joints pain, upper and lower extremity paresthesia, ?   ?Summary of the test: ?Nerve conduction study: ?Right sural, superficial peroneal, median, ulnar, and mixed responses were normal. ? ?Right peroneal to EDB, tibial, median and ulnar motor responses were normal. ? ?Electromyography: ? ?Selected needle examination of right lower extremity muscles, lumbosacral paraspinal muscles; upper extremity muscles and cervical paraspinal muscles were normal. ? ?Conclusion: ?This is a normal study.  There is NO electrodiagnostic evidence of large fiber peripheral neuropathy, focal neuropathy, right cervical or lumbosacral radiculopathy. ? ? ? ?------------------------------- ?Marcial Pacas, M.D. PhD ? ?[facility] ?[address], [address] ?[place], [place] [address] ?Tel: [phone] ?Fax: [phone] ? ?Verbal informed consent was obtained from the patient, patient was informed of potential risk of procedure, including bruising, bleeding, hematoma formation, infection, muscle weakness, muscle pain, numbness, among others. ?   ? ?   ?[place] ?   ?Nerve / Sites Muscle Latency Ref. Amplitude Ref. Rel Amp Segments Distance Velocity Ref. Area  ?  ms ms mV mV %  cm m/s m/s mVms  ?R Median - APB  ?   Wrist APB 3.3 ?4.4 10.0 ?4.0 100 Wrist - APB 7   37.3  ?   Upper arm APB 6.9  10.4  104 Upper arm - Wrist 19 53 ?49 39.7  ?R Ulnar - ADM  ?   Wrist ADM 2.7 ?3.3 11.5 ?6.0 100 Wrist - ADM 7   36.3  ?   B.Elbow ADM 5.5  11.0  96 B.Elbow - Wrist 18 64 ?49 37.6  ?   A.Elbow ADM 7.1  10.6  95.9 A.Elbow - B.Elbow 10 63 ?49 38.0  ?R Peroneal - EDB  ?   Ankle EDB 3.3 ?6.5 4.1 ?2.0 100 Ankle - EDB 9   13.9  ?   Fib head EDB 8.0   3.5  86 Fib head - Ankle 25 54 ?44 14.6  ?   Pop fossa EDB 9.6  3.6  103 Pop fossa - Fib head 8 50 ?44 13.2  ?       Pop fossa - Ankle      ?R Tibial - AH  ?   Ankle AH 4.0 ?5.8 8.7 ?4.0 100 Ankle - AH 9   26.1  ?   Pop fossa AH 9.9  0.5  6.1 Pop fossa - Ankle 38 64 ?41 2.7  ?           ?[place] ?   ?Nerve / Sites Rec. Site Peak Lat Ref.  Amp Ref. Segments Distance Peak Diff Ref.  ?  ms ms ?V ?V  cm ms ms  ?R Sural - Ankle (Calf)  ?   Calf Ankle 3.9 ?4.4 6 ?6 Calf - Ankle 14    ?R Superficial peroneal - Ankle  ?   Lat leg Ankle 2.6 ?4.4 7 ?6 Lat leg - Ankle 10    ?R Median, Ulnar - Transcarpal comparison  ?   Median Palm Wrist 2.0 ?2.2 79 ?35 Median Palm - Wrist 8    ?   Ulnar Palm Wrist 1.7 ?2.2 17 ?12 Ulnar Palm - Wrist 8    ?  Median Palm - Ulnar Palm  0.3 ?0.4  ?R Median - Orthodromic (Dig II, Mid palm)  ?   Dig II Wrist 3.0 ?3.4 11 ?10 Dig II - Wrist 13    ?R Ulnar - Orthodromic, (Dig V, Mid palm)  ?   Dig V Wrist 2.5 ?3.1 8 ?5 Dig V - Wrist 11    ?             ?F  Wave ?   ?Nerve F Lat Ref.  ? ms ms  ?R Tibial - AH 48.8 ?56.0  ?R Ulnar - ADM 25.7 ?32.0  ?       ?EMG Summary Table   ? Spontaneous MUAP Recruitment  ?Muscle IA Fib PSW Fasc Other Amp Dur. Poly Pattern  ?R. Tibialis posterior Normal None None None _______ Normal Normal Normal Normal  ?R. Tibialis anterior Normal None None None _______ Normal Normal Normal Normal  ?R. Peroneus longus Normal None None None _______ Normal Normal Normal Normal  ?R. Gastrocnemius (Medial head) Normal None None None _______ Normal Normal Normal Normal  ?R. Vastus lateralis Normal None None None _______ Normal Normal Normal Normal  ?R. Lumbar paraspinals (mid) Normal None None None _______ Normal Normal Normal Normal  ?R. Lumbar paraspinals (low) Normal None None None _______ Normal Normal Normal Normal  ?R. First dorsal interosseous Normal None None None _______ Normal Normal Normal Normal  ?R. Pronator teres Normal None None None _______ Normal Normal Normal Normal  ?R.  Triceps brachii Normal None None None _______ Normal Normal Normal Normal  ?R. Deltoid Normal None None None _______ Normal Normal Normal Normal  ?R. Biceps brachii Normal None None None _______ Normal Normal Normal Normal  ?R. Cervical paraspinals Normal None None None _______ Normal Normal Normal Normal  ? ?  ?

## 2021-04-04 NOTE — Telephone Encounter (Signed)
They checked a CRP and ESR, which has been elevated in the past and she has been seen by rheumatology for this.  Levels are mildly elevated and may simply indicated chronic inflammation.  I'm afraid there is not much to be done on this end, as these are nonspecific and I believe Dr Estanislado Pandy has worked her up for rheumatologic processes already. ?

## 2021-04-05 NOTE — Progress Notes (Signed)
? ?Office Visit Note ? ?Patient: Ajaya Crutchfield             ?Date of Birth: May 26, 1975           ?MRN: [id number]             ?PCP: [name], DO ?Referring: [name], DO ?Visit Date: 04/07/2021 ?Occupation: '@GUAROCC'$ @ ? ?Subjective:  ?Other (Left knee pain ) ? ? ?History of Present Illness: Ellieanna Funderburg is a 46 y.o. female with history of osteoarthritis.  She states she has been having pain and discomfort in her left knee joint.  She has discomfort when she gets up from the sitting position.  Right knee is doing better after the cortisone injection.  She continues to have some generalized pain.  None of the other joints are painful.  She continues to have a lot of fatigue.  She was recently evaluated by Dr. Krista Blue who obtained lab work.  Her sed rate and CRP was elevated.  She was suspicious that patient may have polymyalgia rheumatica.  Patient has no muscular weakness or tenderness.  She has known temporal tenderness.  She denies any difficulty getting up from the chair. ? ?Activities of Daily Living:  ?Patient reports morning stiffness for 30 minutes.   ?Patient Reports nocturnal pain.  ?Difficulty dressing/grooming: Reports ?Difficulty climbing stairs: Denies ?Difficulty getting out of chair: Reports ?Difficulty using hands for taps, buttons, cutlery, and/or writing: Reports ? ?Review of Systems  ?Constitutional:  Positive for fatigue.  ?HENT:  Negative for mouth sores, mouth dryness and nose dryness.   ?Eyes:  Negative for pain, itching and dryness.  ?Respiratory:  Negative for shortness of breath and difficulty breathing.   ?Cardiovascular:  Positive for palpitations. Negative for chest pain.  ?Gastrointestinal:  Negative for blood in stool, constipation and diarrhea.  ?Endocrine: Negative for increased urination.  ?Genitourinary:  Negative for difficulty urinating.  ?Musculoskeletal:  Positive for joint pain, joint pain, myalgias, morning stiffness, muscle tenderness and myalgias. Negative  for joint swelling.  ?Skin:  Positive for color change and rash.  ?Allergic/Immunologic: Negative for susceptible to infections.  ?Neurological:  Positive for numbness and headaches. Negative for dizziness, memory loss and weakness.  ?Hematological:  Negative for bruising/bleeding tendency.  ?Psychiatric/Behavioral:  Negative for confusion. The patient is nervous/anxious.   ? ?PMFS History:  ?Patient Active Problem List  ? Diagnosis Date Noted  ? RLS (restless legs syndrome) 03/31/2021  ? 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 Vivid dream 03/31/2021  ? Snoring 03/31/2021  ? Chronic fatigue 03/31/2021  ? Excessive daytime sleepiness 03/31/2021  ? GERD with apnea 03/31/2021  ? Sleep related headaches 03/31/2021  ? Body aches 03/30/2021  ? Paresthesia 02/04/2021  ? Obstructive sleep apnea 02/04/2021  ? History of gastroesophageal reflux (GERD) 01/28/2021  ? Vitamin D deficiency 01/28/2021  ? History of anxiety 01/28/2021  ? Bilateral hand numbness 12/16/2020  ? Other intervertebral disc degeneration, lumbar region 12/16/2020  ?  ?Past Medical History:  ?Diagnosis Date  ? Anxiety   ? Arthritis   ? Endometriosis   ? now s/p partial hysterectomy 06/2020  ? 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  ?Family History  ?Problem Relation Age of Onset  ? Diabetes Mother   ? Liver disease Mother   ? COPD Father   ? Diabetes Father   ? Heart disease Maternal Grandmother   ? Cancer Maternal Grandfather   ? Cancer Paternal Grandmother   ? Diabetes Paternal Grandfather   ? ?  Past Surgical History:  ?Procedure Laterality Date  ? ABDOMINAL HYSTERECTOMY    ? partial, ovary sparing  ? CESAREAN SECTION    ? WISDOM TOOTH EXTRACTION Bilateral   ? ?Social History  ? ?Social History Narrative  ? Right handed  ? Caffeine use: no tea, Coffee-tries to drink decaf, diet sodas  ? Mother also sees me, [name]  ? ? ?There is no immunization history on file for this patient.  ? ?Objective: ?Vital Signs: BP 126/84 (BP Location: Left Arm,  Patient Position: Sitting, Cuff Size: Large)   Pulse 82   Ht '5\' 3"'$  (1.6 m)   Wt 260 lb 3.2 oz (118 kg)   BMI 46.09 kg/m?   ? ?Physical Exam ?Vitals and nursing note reviewed.  ?Constitutional:   ?   Appearance: She is well-developed.  ?HENT:  ?   Head: Normocephalic and atraumatic.  ?Eyes:  ?   Conjunctiva/sclera: Conjunctivae normal.  ?Cardiovascular:  ?   Rate and Rhythm: Normal rate and regular rhythm.  ?   Heart sounds: Normal heart sounds.  ?Pulmonary:  ?   Effort: Pulmonary effort is normal.  ?   Breath sounds: Normal breath sounds.  ?Abdominal:  ?   General: Bowel sounds are normal.  ?   Palpations: Abdomen is soft.  ?Musculoskeletal:  ?   Cervical back: Normal range of motion.  ?Lymphadenopathy:  ?   Cervical: No cervical adenopathy.  ?Skin: ?   General: Skin is warm and dry.  ?   Capillary Refill: Capillary refill takes less than 2 seconds.  ?Neurological:  ?   Mental Status: She is alert and oriented to person, place, and time.  ?Psychiatric:     ?   Behavior: Behavior normal.  ?  ? ?Musculoskeletal Exam: C-spine was in good range of motion.  Shoulder joints, elbow joints, wrist joints, MCPs PIPs and DIPs and good range of motion with no synovitis.  Hip joints, knee joints, ankles, MTPs and PIPs with good range of motion with no warmth swelling or effusion.  She had discomfort range of motion of her left knee joint.  She had generalized hyperalgesia and positive tender points. ? ?CDAI Exam: ?CDAI Score: -- ?Patient Global: --; Provider Global: -- ?Swollen: --; Tender: -- ?Joint Exam 04/07/2021  ? ?No joint exam has been documented for this visit  ? ?There is currently no information documented on the homunculus. Go to the Rheumatology activity and complete the homunculus joint exam. ? ?Investigation: ?No additional findings. ? ?Imaging: ?NCV with EMG(electromyography) ? ?Result Date: 03/30/2021 ?Marcial Pacas, MD     04/04/2021  4:38 PM     Full Name: Terrisa Curfman Gender: Female MRN #: [id number] Date of  Birth: October 21, 1975   Visit Date: 03/30/2021 07:57 Age: 7 Years Examining Physician: Marcial Pacas, MD Referring Physician: Marcial Pacas, MD Height: 5 feet 3 inch Patient History: 46 year old female, complains of bilateral hands joints pain, upper and lower extremity paresthesia,   Summary of the test: Nerve conduction study: Right sural, superficial peroneal, median, ulnar, and mixed responses were normal. Right peroneal to EDB, tibial, median and ulnar motor responses were normal. Electromyography: Selected needle examination of right lower extremity muscles, lumbosacral paraspinal muscles; upper extremity muscles and cervical paraspinal muscles were normal. Conclusion: This is a normal study.  There is NO electrodiagnostic evidence of large fiber peripheral neuropathy, focal neuropathy, right cervical or lumbosacral radiculopathy. ------------------------------- Marcial Pacas, M.D. PhD [facility] [address] [place], [place] [address] Tel: [phone]  Fax: [phone] Verbal informed consent was obtained from the patient, patient was informed of potential risk of procedure, including bruising, bleeding, hematoma formation, infection, muscle weakness, muscle pain, numbness, among others.     [place]   Nerve / Sites Muscle Latency Ref. Amplitude Ref. Rel Amp Segments Distance Velocity Ref. Area   ms ms mV mV %  cm m/s m/s mVms R Median - APB    Wrist APB 3.3 ?4.4 10.0 ?4.0 100 Wrist - APB 7   37.3    Upper arm APB 6.9  10.4  104 Upper arm - Wrist 19 53 ?49 39.7 R Ulnar - ADM    Wrist ADM 2.7 ?3.3 11.5 ?6.0 100 Wrist - ADM 7   36.3    B.Elbow ADM 5.5  11.0  96 B.Elbow - Wrist 18 64 ?49 37.6    A.Elbow ADM 7.1  10.6  95.9 A.Elbow - B.Elbow 10 63 ?49 38.0 R Peroneal - EDB    Ankle EDB 3.3 ?6.5 4.1 ?2.0 100 Ankle - EDB 9   13.9    Fib head EDB 8.0  3.5  86 Fib head - Ankle 25 54 ?44 14.6    Pop fossa EDB 9.6  3.6  103 Pop fossa - Fib head 8 50 ?44 13.2        Pop fossa - Ankle     R Tibial - AH    Ankle AH  4.0 ?5.8 8.7 ?4.0 100 Ankle - AH 9   26.1    Pop fossa AH 9.9  0.5  6.1 Pop fossa - Ankle 38 64 ?41 2.7           SNC   Nerve / Sites Rec. Site Peak Lat Ref.  Amp Ref. Segments Distance Peak Diff Ref.   ms ms

## 2021-04-05 NOTE — Telephone Encounter (Signed)
We will schedule an earlier appointment.  I would hold off prednisone because it may mask any underlying problem.  It will be unusual to develop PMR prior to age 46.  She will need further work-up.

## 2021-04-05 NOTE — Telephone Encounter (Signed)
I called patient, appt 04/07/2021 ?

## 2021-04-07 ENCOUNTER — Ambulatory Visit (INDEPENDENT_AMBULATORY_CARE_PROVIDER_SITE_OTHER): Payer: Medicaid Other | Admitting: Rheumatology

## 2021-04-07 ENCOUNTER — Encounter: Payer: Self-pay | Admitting: Rheumatology

## 2021-04-07 VITALS — BP 126/84 | HR 82 | Ht 63.0 in | Wt 260.2 lb

## 2021-04-07 DIAGNOSIS — B351 Tinea unguium: Secondary | ICD-10-CM

## 2021-04-07 DIAGNOSIS — G4709 Other insomnia: Secondary | ICD-10-CM

## 2021-04-07 DIAGNOSIS — Z8719 Personal history of other diseases of the digestive system: Secondary | ICD-10-CM

## 2021-04-07 DIAGNOSIS — M19072 Primary osteoarthritis, left ankle and foot: Secondary | ICD-10-CM

## 2021-04-07 DIAGNOSIS — M1712 Unilateral primary osteoarthritis, left knee: Secondary | ICD-10-CM

## 2021-04-07 DIAGNOSIS — Z87891 Personal history of nicotine dependence: Secondary | ICD-10-CM

## 2021-04-07 DIAGNOSIS — Z8269 Family history of other diseases of the musculoskeletal system and connective tissue: Secondary | ICD-10-CM

## 2021-04-07 DIAGNOSIS — M5136 Other intervertebral disc degeneration, lumbar region: Secondary | ICD-10-CM

## 2021-04-07 DIAGNOSIS — M797 Fibromyalgia: Secondary | ICD-10-CM

## 2021-04-07 DIAGNOSIS — M170 Bilateral primary osteoarthritis of knee: Secondary | ICD-10-CM

## 2021-04-07 DIAGNOSIS — Z8659 Personal history of other mental and behavioral disorders: Secondary | ICD-10-CM

## 2021-04-07 DIAGNOSIS — E559 Vitamin D deficiency, unspecified: Secondary | ICD-10-CM

## 2021-04-07 DIAGNOSIS — M19071 Primary osteoarthritis, right ankle and foot: Secondary | ICD-10-CM | POA: Diagnosis not present

## 2021-04-07 DIAGNOSIS — M51369 Other intervertebral disc degeneration, lumbar region without mention of lumbar back pain or lower extremity pain: Secondary | ICD-10-CM

## 2021-04-07 DIAGNOSIS — R200 Anesthesia of skin: Secondary | ICD-10-CM

## 2021-04-07 DIAGNOSIS — M19041 Primary osteoarthritis, right hand: Secondary | ICD-10-CM | POA: Diagnosis not present

## 2021-04-07 DIAGNOSIS — M19042 Primary osteoarthritis, left hand: Secondary | ICD-10-CM

## 2021-04-07 DIAGNOSIS — R700 Elevated erythrocyte sedimentation rate: Secondary | ICD-10-CM

## 2021-04-07 DIAGNOSIS — R5383 Other fatigue: Secondary | ICD-10-CM

## 2021-04-07 MED ORDER — TRIAMCINOLONE ACETONIDE 40 MG/ML IJ SUSP
40.0000 mg | INTRAMUSCULAR | Status: AC | PRN
  Administered 2021-04-07: 40 mg via INTRA_ARTICULAR

## 2021-04-07 MED ORDER — LIDOCAINE HCL 1 % IJ SOLN
1.5000 mL | INTRAMUSCULAR | Status: AC | PRN
  Administered 2021-04-07: 1.5 mL

## 2021-04-18 ENCOUNTER — Ambulatory Visit (INDEPENDENT_AMBULATORY_CARE_PROVIDER_SITE_OTHER): Payer: Medicaid Other | Admitting: Neurology

## 2021-04-18 DIAGNOSIS — F409 Phobic anxiety disorder, unspecified: Secondary | ICD-10-CM

## 2021-04-18 DIAGNOSIS — G4761 Periodic limb movement disorder: Secondary | ICD-10-CM | POA: Diagnosis not present

## 2021-04-18 DIAGNOSIS — R6889 Other general symptoms and signs: Secondary | ICD-10-CM

## 2021-04-18 DIAGNOSIS — R5382 Chronic fatigue, unspecified: Secondary | ICD-10-CM

## 2021-04-18 DIAGNOSIS — R0683 Snoring: Secondary | ICD-10-CM

## 2021-04-18 DIAGNOSIS — G2581 Restless legs syndrome: Secondary | ICD-10-CM

## 2021-04-18 DIAGNOSIS — G4719 Other hypersomnia: Secondary | ICD-10-CM

## 2021-04-19 ENCOUNTER — Ambulatory Visit: Payer: Medicaid Other | Admitting: Rheumatology

## 2021-04-25 ENCOUNTER — Encounter: Payer: Self-pay | Admitting: Neurology

## 2021-04-25 NOTE — Procedures (Signed)
PATIENT'S NAME:  Selena Howard ?DOB:      1975-07-13      ?MR#:    [id number]     ?DATE OF RECORDING: 04/18/2021 ?REFERRING M.D.:  Selena Norlander, DO ?Study Performed:   Baseline Polysomnogram ?HISTORY:  Selena Howard is a 46 y.o. Female patient. She was seen on 03/31/2021 , upon referral by PCP Selena Doss, DO for a sleep consultation. She is also a patient of Selena Howard.  ?Chief concern according to patient:  " I am always fatigued, all day long and at night I can't sleep well, sometimes taking hours to fall asleep?.  ?  ?I have the pleasure of seeing Selena Howard, a right -handed Caucasian female who has a medical history of Anxiety, Arthritis, Endometriosis, and Spinal stenosis at L4-L5 level.  Hysterectomy June 2022,  ?She has reported a long history of chronic multiple joints pain, intermittent bilateral hands and feet paresthesia, chronic low back pain, gradually getting worse over the past few years, not developed bilateral low back pain with radiating pain to bilateral lower extremities, and both feet, she complains of difficulty walking mainly attributed to her knee pain, foot pain, and Class 3 morbid obesity, cyclic insomnia /sleeplessness, daytime sleepiness. She gained 30 pounds since hysterectomy, was anemic before due to fibroids- but she felt worse after surgery in terms of hair loss, and skin dryness.  ?   ?Family medical /sleep history: father on BiPAP, mother on CPAP. ?The patient endorsed the Epworth Sleepiness Scale at 10/24 points.  FSS at 41/63 points.  ? ?The patient's weight 261 pounds with a height of 63 (inches), resulting in a BMI of 46.1 kg/m2. ? ?The patient's neck circumference measured 16 inches. ? ?CURRENT MEDICATIONS: Valisone, Chlorphen-PE-Acetaminophen, Vitamin D3, Penlac, Cymbalta, Nexium, Prozac, Flonase, Neurontin, Ultram ?  ?PROCEDURE:  This is a multichannel digital polysomnogram utilizing the Somnostar 11.2 system.  Electrodes and sensors were applied and monitored  per AASM Specifications.   EEG, EOG, Chin and Limb EMG, were sampled at 200 Hz.  ECG, Snore and Nasal Pressure, Thermal Airflow, Respiratory Effort, CPAP Flow and Pressure, Oximetry was sampled at 50 Hz. Digital video and audio were recorded.     ? ?BASELINE STUDY: Lights Out was at 21:42 and Lights On at 04:59.  Total recording time (TRT) was 437.5 minutes, with a total sleep time (TST) of 288.5 minutes.   The patient's sleep latency was prolonged  at 81 minutes.  REM latency was prolonged at 222.5 minutes.  The sleep efficiency was 65.9 %.  ?   ?SLEEP ARCHITECTURE: WASO (Wake after sleep onset) was 66.5 minutes.  There were 15.5 minutes in Stage N1, 224.5 minutes Stage N2, 19 minutes Stage N3 and 29.5 minutes in Stage REM.  The percentage of Stage N1 was 5.4%, Stage N2 was 77.8%, Stage N3 was 6.6% and Stage R (REM sleep) was 10.2%.  ? ? ? ?RESPIRATORY ANALYSIS:  There were a total of 0 respiratory events:  0 obstructive apneas, 0 central apneas and 0 mixed apneas with a total of 0 apneas and an apnea index (AI) of 0 /hour. There were 0 hypopneas with a hypopnea index of 0 /hour. The patient also had 0 respiratory event related arousals (RERAs).  ?    ?The total APNEA/HYPOPNEA INDEX (AHI) was 0/hour and the total RESPIRATORY DISTURBANCE INDEX was  0 /hour.  0 events occurred in REM sleep and 0 events in NREM. The REM AHI was  0 /hour, versus a non-REM AHI of  0. The patient spent 0.5 minutes of total sleep time in the supine position and 288 minutes in non-supine.. The supine AHI was 0.0 versus a non-supine AHI of 0.0. ? ?OXYGEN SATURATION & C02:  The Wake baseline 02 saturation was 96%, with the lowest being 84%. Time spent below 89% saturation equaled 8 minutes. ? ?The arousals were noted as: 37 were spontaneous, 3 were associated with PLMs, 0 were associated with respiratory events. ?  ?PERIODIC LIMB MOVEMENTS:   ?The patient had a total of 109 Periodic Limb Movements.  The Periodic Limb Movement (PLM) Arousal  index was 0.6/hour. ? ?Audio and video analysis did not show any abnormal or unusual phonations or vocalizations.  The patient was restless and had frequent limb movements and  ? ?The patient took one bathroom break before sleep onset. ?No Snoring was noted. ?EKG was in keeping with normal sinus rhythm (NSR). ? ? ?IMPRESSION: ? ?No Obstructive Sleep Apnea (OSA)or any sleep disordered breathing. 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 RLS was reported.  ?Sleep fragmentation due to spontaneous arousals- this may be pain and discomfort related.  ? ?RECOMMENDATIONS:  ? 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 Patient is on Cymbalta. ?RLS treatment, consider dopaminergic agonists. ?Insomnia treatment by cognitive behavior therapy if insomnia persists after pain is controlled.  ? ? ?I certify that I have reviewed the entire raw data recording prior to the issuance of this report in accordance with the Standards of Accreditation of the [place] [employer] (AASM) ? ? ? ? ? ?Larey Seat, MD ?[place], [place] Board of Psychiatry and Neurology  ?Diplomat, Tax adviser of Sleep Medicine ?Medical Director, [facility] at [facility] ?

## 2021-05-16 NOTE — Progress Notes (Signed)
? ? ?[address] ?[place], [place] [address] ? ? ?CLINIC:  ?Medical Oncology/Hematology ? ?CONSULT NOTE ? ?Patient Care Team: ?[name], DO as PCP - General (Family Medicine) ?[name], DO as Consulting Physician (Family Medicine) ? ?CHIEF COMPLAINTS/PURPOSE OF CONSULTATION:  ?Evaluation of elevated erythrocyte sedimentation rate ? ?HISTORY OF PRESENTING ILLNESS:  ?Selena Howard 46 y.o. female is here at the request of rheumatology (Dr. Estanislado Pandy) for evaluation of elevated erythrocyte sedimentation rate. ? ?Per note by Dr. Estanislado Pandy from 04/07/2021, patient was evaluated by rheumatology due to fibromyalgia, joint pain, generalized pain, and suspicion for possible polymyalgia rheumatica.  Per Dr. Estanislado Pandy, clinical and radiographic findings were consistent with osteoarthritis and all autoimmune work-up was negative.  However, she was noted to have elevated sedimentation rate since November 2022 along with elevated CRP in the setting of negative autoimmune work-up.  She was therefore referred to oncology for further evaluation. ? ?Review of past labs shows mildly elevated ESR 34 (11/30/2020), ESR 31 (01/28/2021), and ESR 40 (03/30/2021).  CRP was also elevated at 19 (11/30/2020) and 33 (03/30/2021). ? ?Patient denies any recent infections, injury, or trauma.  She has history of osteoarthritis and fibromyalgia, but denies any other inflammatory or autoimmune disease.  No concern for pregnancy, as she is s/p hysterectomy.  She does have morbid obesity with BMI of 45.  She smokes on and off for approximately 20 years, but quit smoking about 1 year ago.  She does not have any history of miscarriages.  No known history of anemia, kidney disease, or venous thromboembolism. ? ?She had mammogram 2 years ago that was reportedly normal.  She had colonoscopy approximately 7 to 8 years ago due to her mother having history of many (30+) polyps, but reports that her colonoscopy was  normal apart from polyp x1.  She reports that she has noticed some changes in stool caliber over the past year, with occasional narrow stools.  She denies any changes in her bladder breathing habits.  She has not had any fever, chills, night sweats, or unintentional weight loss.  She denies any new lumps or bumps. ? ?Her past medical history includes osteoarthritis, endometriosis s/p hysterectomy, degenerative disc disease with spinal stenosis, anxiety, and fibromyalgia.  She is morbidly obese.  She currently lives with her parents and is unemployed, but previously worked in a [employer].  She denies any history of alcohol or drug use.  She does have a history of tobacco use and smoked intermittently for approximately 20 years.  She quit smoking entirely over 1 year ago.  Family history includes maternal grandfather with prostate cancer and paternal grandmother with unspecified "tumor in her head."  She has a cousin on her father side who had several autoimmune conditions, including lupus, who sadly passed away at age 60 as complications from her autoimmune diseases.  Her parents both have fibromyalgia. ? ? ?MEDICAL HISTORY:  ?Past Medical History:  ?Diagnosis Date  ? Anxiety   ? Arthritis   ? Endometriosis   ? now s/p partial hysterectomy 06/2020  ? 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 ? ?SURGICAL HISTORY: ?Past Surgical History:  ?Procedure Laterality Date  ? ABDOMINAL HYSTERECTOMY    ? partial, ovary sparing  ? CESAREAN SECTION    ? WISDOM TOOTH EXTRACTION Bilateral   ? ? ?SOCIAL HISTORY: ?Social History  ? ?Socioeconomic History  ? Marital status: Divorced  ?  Spouse name: Not on file  ? Number of children: 3  ?  Years of education: 16  ? Highest education level: High school graduate  ?Occupational History  ? Not on file  ?Tobacco Use  ? Smoking status: Former  ?  Packs/day: 0.50  ?  Years: 10.00  ?  Pack years: 5.00  ?  Types: Cigarettes  ?  Quit date: 06/02/2020  ?  Years since quitting: 0.9  ?  Passive exposure: Past   ? Smokeless tobacco: Never  ?Vaping Use  ? Vaping Use: Never used  ?Substance and Sexual Activity  ? Alcohol use: Not Currently  ? Drug use: Not Currently  ? Sexual activity: Not Currently  ?Other Topics Concern  ? Not on file  ?Social History Narrative  ? Right handed  ? Caffeine use: no tea, Coffee-tries to drink decaf, diet sodas  ? Mother also sees me, [name]  ? ?Social Determinants of Health  ? ?Financial Resource Strain: Not on file  ?Food Insecurity: Not on file  ?Transportation Needs: Not on file  ?Physical Activity: Not on file  ?Stress: Not on file  ?Social Connections: Not on file  ?Intimate Partner Violence: Not on file  ? ? ?FAMILY HISTORY: ?Family History  ?Problem Relation Age of Onset  ? Diabetes Mother   ? Liver disease Mother   ? COPD Father   ? Diabetes Father   ? Heart disease Maternal Grandmother   ? Cancer Maternal Grandfather   ? Cancer Paternal Grandmother   ? Diabetes Paternal Grandfather   ? ? ?ALLERGIES:  is allergic to keflex [cephalexin]. ? ?MEDICATIONS:  ?Current Outpatient Medications  ?Medication Sig Dispense Refill  ? azithromycin (ZITHROMAX Z-PAK) 250 MG tablet Take 2 tablets (500 mg) PO today, then 1 tablet (250 mg) PO daily x4 days. 6 tablet 0  ? betamethasone valerate (VALISONE) 0.1 % cream APPLY TOPICALLY 2 (TWO) TIMES DAILY FOR 10-14 DAYS TO FOOT 30 g 1  ? Chlorphen-PE-Acetaminophen 4-10-325 MG TABS Take 1 tablet by mouth every 6 (six) hours as needed. (Patient not taking: Reported on 04/07/2021) 20 tablet 0  ? Cholecalciferol (VITAMIN D3) 1.25 MG (50000 UT) CAPS Take 1 capsule by mouth once a week.    ? ciclopirox (PENLAC) 8 % solution Apply topically daily.    ? DULoxetine (CYMBALTA) 30 MG capsule Take 1 capsule (30 mg total) by mouth daily. 30 capsule 3  ? esomeprazole (NEXIUM) 40 MG capsule TAKE 1 CAPSULE (40 MG TOTAL) BY MOUTH DAILY. 30 capsule 1  ? FLUoxetine (PROZAC) 20 MG capsule Take 20 mg by mouth daily.    ? fluticasone (FLONASE) 50 MCG/ACT nasal spray Place  2 sprays into both nostrils daily. 16 g 2  ? gabapentin (NEURONTIN) 300 MG capsule TAKE 2 CAPSULES BY MOUTH 3 TIMES DAILY. (Patient not taking: Reported on 04/07/2021) 180 capsule 1  ? traMADol (ULTRAM) 50 MG tablet Take 1 tablet (50 mg total) by mouth every 12 (twelve) hours as needed for severe pain. Put on file 30 tablet 0  ? ?No current facility-administered medications for this visit.  ? ? ?REVIEW OF SYSTEMS:   ?Review of Systems  ?Constitutional:  Positive for fatigue. Negative for appetite change, chills, diaphoresis, fever and unexpected weight change.  ?HENT:   Negative for lump/mass and nosebleeds.   ?Eyes:  Negative for eye problems.  ?Respiratory:  Negative for cough, hemoptysis and shortness of breath.   ?Cardiovascular:  Positive for leg swelling. Negative for chest pain and palpitations.  ?Gastrointestinal:  Positive for constipation and nausea. Negative for abdominal pain, blood in stool,  diarrhea and vomiting.  ?Genitourinary:  Negative for hematuria.   ?Musculoskeletal:  Positive for arthralgias and myalgias.  ?Skin: Negative.   ?Neurological:  Positive for headaches and numbness. Negative for dizziness and light-headedness.  ?Hematological:  Does not bruise/bleed easily.   ? ? ?PHYSICAL EXAMINATION: ?ECOG PERFORMANCE STATUS: 1 - Symptomatic but completely ambulatory ? ?There were no vitals filed for this visit. ?There were no vitals filed for this visit. ? ?Physical Exam ?Constitutional:   ?   Appearance: Normal appearance. She is morbidly obese.  ?HENT:  ?   Head: Normocephalic and atraumatic.  ?   Mouth/Throat:  ?   Mouth: Mucous membranes are moist.  ?Eyes:  ?   Extraocular Movements: Extraocular movements intact.  ?   Pupils: Pupils are equal, round, and reactive to light.  ?Cardiovascular:  ?   Rate and Rhythm: Normal rate and regular rhythm.  ?   Pulses: Normal pulses.  ?   Heart sounds: Normal heart sounds.  ?Pulmonary:  ?   Effort: Pulmonary effort is normal.  ?   Breath sounds: Normal  breath sounds.  ?Abdominal:  ?   General: Bowel sounds are normal.  ?   Palpations: Abdomen is soft.  ?   Tenderness: There is no abdominal tenderness.  ?Musculoskeletal:     ?   General: No swelling.  ?   [place]

## 2021-05-17 ENCOUNTER — Encounter (INDEPENDENT_AMBULATORY_CARE_PROVIDER_SITE_OTHER): Payer: Self-pay | Admitting: *Deleted

## 2021-05-17 ENCOUNTER — Encounter (HOSPITAL_COMMUNITY): Payer: Self-pay | Admitting: Hematology

## 2021-05-17 ENCOUNTER — Inpatient Hospital Stay (HOSPITAL_COMMUNITY): Payer: Medicaid Other | Attending: Hematology | Admitting: Hematology

## 2021-05-17 ENCOUNTER — Inpatient Hospital Stay (HOSPITAL_COMMUNITY): Payer: Medicaid Other

## 2021-05-17 VITALS — HR 64 | Temp 98.1°F | Resp 18 | Ht 63.0 in | Wt 257.9 lb

## 2021-05-17 DIAGNOSIS — R194 Change in bowel habit: Secondary | ICD-10-CM | POA: Diagnosis not present

## 2021-05-17 DIAGNOSIS — Z8042 Family history of malignant neoplasm of prostate: Secondary | ICD-10-CM | POA: Insufficient documentation

## 2021-05-17 DIAGNOSIS — Z87891 Personal history of nicotine dependence: Secondary | ICD-10-CM | POA: Insufficient documentation

## 2021-05-17 DIAGNOSIS — R700 Elevated erythrocyte sedimentation rate: Secondary | ICD-10-CM | POA: Diagnosis present

## 2021-05-17 DIAGNOSIS — F419 Anxiety disorder, unspecified: Secondary | ICD-10-CM | POA: Insufficient documentation

## 2021-05-17 DIAGNOSIS — Z1231 Encounter for screening mammogram for malignant neoplasm of breast: Secondary | ICD-10-CM

## 2021-05-17 DIAGNOSIS — R195 Other fecal abnormalities: Secondary | ICD-10-CM

## 2021-05-17 DIAGNOSIS — M1990 Unspecified osteoarthritis, unspecified site: Secondary | ICD-10-CM | POA: Insufficient documentation

## 2021-05-17 DIAGNOSIS — Z6841 Body Mass Index (BMI) 40.0 and over, adult: Secondary | ICD-10-CM | POA: Diagnosis not present

## 2021-05-17 DIAGNOSIS — M797 Fibromyalgia: Secondary | ICD-10-CM | POA: Insufficient documentation

## 2021-05-17 DIAGNOSIS — M5136 Other intervertebral disc degeneration, lumbar region: Secondary | ICD-10-CM | POA: Diagnosis not present

## 2021-05-17 DIAGNOSIS — M48061 Spinal stenosis, lumbar region without neurogenic claudication: Secondary | ICD-10-CM | POA: Insufficient documentation

## 2021-05-17 DIAGNOSIS — Z79899 Other long term (current) drug therapy: Secondary | ICD-10-CM | POA: Diagnosis not present

## 2021-05-17 LAB — CBC WITH DIFFERENTIAL/PLATELET
Abs Immature Granulocytes: 0.01 10*3/uL (ref 0.00–0.07)
Basophils Absolute: 0 10*3/uL (ref 0.0–0.1)
Basophils Relative: 0 %
Eosinophils Absolute: 0 10*3/uL (ref 0.0–0.5)
Eosinophils Relative: 1 %
HCT: 37.8 % (ref 36.0–46.0)
Hemoglobin: 12.3 g/dL (ref 12.0–15.0)
Immature Granulocytes: 0 %
Lymphocytes Relative: 39 %
Lymphs Abs: 2.1 10*3/uL (ref 0.7–4.0)
MCH: 29.5 pg (ref 26.0–34.0)
MCHC: 32.5 g/dL (ref 30.0–36.0)
MCV: 90.6 fL (ref 80.0–100.0)
Monocytes Absolute: 0.4 10*3/uL (ref 0.1–1.0)
Monocytes Relative: 7 %
Neutro Abs: 2.8 10*3/uL (ref 1.7–7.7)
Neutrophils Relative %: 53 %
Platelets: 259 10*3/uL (ref 150–400)
RBC: 4.17 MIL/uL (ref 3.87–5.11)
RDW: 15.2 % (ref 11.5–15.5)
WBC: 5.3 10*3/uL (ref 4.0–10.5)
nRBC: 0 % (ref 0.0–0.2)

## 2021-05-17 LAB — COMPREHENSIVE METABOLIC PANEL
ALT: 17 U/L (ref 0–44)
AST: 16 U/L (ref 15–41)
Albumin: 3.4 g/dL — ABNORMAL LOW (ref 3.5–5.0)
Alkaline Phosphatase: 68 U/L (ref 38–126)
Anion gap: 2 — ABNORMAL LOW (ref 5–15)
BUN: 14 mg/dL (ref 6–20)
CO2: 30 mmol/L (ref 22–32)
Calcium: 8.6 mg/dL — ABNORMAL LOW (ref 8.9–10.3)
Chloride: 106 mmol/L (ref 98–111)
Creatinine, Ser: 0.77 mg/dL (ref 0.44–1.00)
GFR, Estimated: >60 mL/min (ref >60)
Glucose, Bld: 93 mg/dL (ref 70–99)
Potassium: 4.3 mmol/L (ref 3.5–5.1)
Sodium: 138 mmol/L (ref 135–145)
Total Bilirubin: 0.6 mg/dL (ref 0.3–1.2)
Total Protein: 6.6 g/dL (ref 6.5–8.1)

## 2021-05-17 NOTE — Therapy (Incomplete)
?OUTPATIENT PHYSICAL THERAPY LOWER EXTREMITY EVALUATION ? ? ?Patient Name: Selena Howard ?MRN: [id number] ?DOB:03/31/1975, 46 y.o., female ?Today's Date: 05/17/2021 ? ? ? ?Past Medical History:  ?Diagnosis Date  ? Anxiety   ? Arthritis   ? Endometriosis   ? now s/p partial hysterectomy 06/2020  ? 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 ?Past Surgical History:  ?Procedure Laterality Date  ? ABDOMINAL HYSTERECTOMY    ? partial, ovary sparing  ? CESAREAN SECTION    ? WISDOM TOOTH EXTRACTION Bilateral   ? ?Patient Active Problem List  ? Diagnosis Date Noted  ? Elevated sedimentation rate 05/17/2021  ? RLS (restless legs syndrome) 03/31/2021  ? 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 Vivid dream 03/31/2021  ? Snoring 03/31/2021  ? Chronic fatigue 03/31/2021  ? Excessive daytime sleepiness 03/31/2021  ? GERD with apnea 03/31/2021  ? Sleep related headaches 03/31/2021  ? Body aches 03/30/2021  ? Paresthesia 02/04/2021  ? Obstructive sleep apnea 02/04/2021  ? History of gastroesophageal reflux (GERD) 01/28/2021  ? Vitamin D deficiency 01/28/2021  ? History of anxiety 01/28/2021  ? Bilateral hand numbness 12/16/2020  ? Other intervertebral disc degeneration, lumbar region 12/16/2020  ? ? ?PCP: *** ? ?REFERRING PROVIDER: *** ? ?REFERRING DIAG: *** ? ?THERAPY DIAG:  ?No diagnosis found. ? ?ONSET DATE: *** ? ?SUBJECTIVE:  ? ?SUBJECTIVE STATEMENT: ?*** ? ?PERTINENT HISTORY: ?*** ? ?PAIN:  ?Are you having pain? {OPRCPAIN:27236} ? ?PRECAUTIONS: {Therapy precautions:24002} ? ?WEIGHT BEARING RESTRICTIONS {Yes ***/No:24003} ? ?FALLS:  ?Has patient fallen in last 6 months? {fallsyesno:27318} ? ?LIVING ENVIRONMENT: ?Lives with: {[facility] lives with:25569::"lives with their family"} ?Lives in: {Lives in:25570} ?Stairs: {opstairs:27293} ?Has following equipment at home: {Assistive devices:23999} ? ?OCCUPATION: *** ? ?PLOF: {PLOF:24004} ? ?PATIENT GOALS *** ? ? ?OBJECTIVE:  ? ?DIAGNOSTIC FINDINGS: *** ? ?PATIENT SURVEYS:  ?{rehab  surveys:24030} ? ?COGNITION: ? Overall cognitive status: {cognition:24006}   ?  ?SENSATION: ?{sensation:27233} ? ?MUSCLE LENGTH: ?Hamstrings: Right *** deg; Left *** deg ?Thomas test: Right *** deg; Left *** deg ? ?POSTURE:  ?*** ? ?PALPATION: ?*** ? ?LE ROM: ? ?{AROM/PROM:27142} ROM Right ?05/17/2021 Left ?05/17/2021  ?Hip flexion    ?Hip extension    ?Hip abduction    ?Hip adduction    ?Hip internal rotation    ?Hip external rotation    ?Knee flexion    ?Knee extension    ?Ankle dorsiflexion    ?Ankle plantarflexion    ?Ankle inversion    ?Ankle eversion    ? (Blank rows = not tested) ? ?LE MMT: ? ?MMT Right ?05/17/2021 Left ?05/17/2021  ?Hip flexion    ?Hip extension    ?Hip abduction    ?Hip adduction    ?Hip internal rotation    ?Hip external rotation    ?Knee flexion    ?Knee extension    ?Ankle dorsiflexion    ?Ankle plantarflexion    ?Ankle inversion    ?Ankle eversion    ? (Blank rows = not tested) ? ?LOWER EXTREMITY SPECIAL TESTS:  ?{LEspecialtests:26242} ? ?FUNCTIONAL TESTS:  ?{Functional tests:24029} ? ?GAIT: ?Distance walked: *** ?Assistive device utilized: {Assistive devices:23999} ?Level of assistance: {Levels of assistance:24026} ?Comments: *** ? ? ? ?TODAY'S TREATMENT: ?*** ? ? ?PATIENT EDUCATION:  ?Education details: *** ?Person educated: {Person educated:25204} ?Education method: {Education Method:25205} ?Education comprehension: {Education Comprehension:25206} ? ? ?HOME EXERCISE PROGRAM: ?*** ? ?ASSESSMENT: ? ?CLINICAL IMPRESSION: ?Patient is a *** y.o. *** who was seen today for physical therapy evaluation and treatment for ***.  ? ? ?  OBJECTIVE IMPAIRMENTS {opptimpairments:25111}.  ? ?ACTIVITY LIMITATIONS {activity limitations:25113}.  ? ?PERSONAL FACTORS {Personal factors:25162} are also affecting patient's functional outcome.  ? ? ?REHAB POTENTIAL: {rehabpotential:25112} ? ?CLINICAL DECISION MAKING: {clinical decision making:25114} ? ?EVALUATION COMPLEXITY: {Evaluation  complexity:25115} ? ? ?GOALS: ?Goals reviewed with patient? {yes/no:20286} ? ?SHORT TERM GOALS: Target date: {follow up:25551}  (Remove Blue Hyperlink) ? ?*** ?Baseline: ?Goal status: {GOALSTATUS:25110} ? ?2.  *** ?Baseline:  ?Goal status: {GOALSTATUS:25110} ? ?3.  *** ?Baseline:  ?Goal status: {GOALSTATUS:25110} ? ?4.  *** ?Baseline:  ?Goal status: {GOALSTATUS:25110} ? ?5.  *** ?Baseline:  ?Goal status: {GOALSTATUS:25110} ? ?6.  *** ?Baseline:  ?Goal status: {GOALSTATUS:25110} ? ?LONG TERM GOALS: Target date: {follow up:25551}  (Remove Blue Hyperlink) ? ?*** ?Baseline:  ?Goal status: {GOALSTATUS:25110} ? ?2.  *** ?Baseline:  ?Goal status: {GOALSTATUS:25110} ? ?3.  *** ?Baseline:  ?Goal status: {GOALSTATUS:25110} ? ?4.  *** ?Baseline:  ?Goal status: {GOALSTATUS:25110} ? ?5.  *** ?Baseline:  ?Goal status: {GOALSTATUS:25110} ? ?6.  *** ?Baseline:  ?Goal status: {GOALSTATUS:25110} ? ? ?PLAN: ?PT FREQUENCY: {rehab frequency:25116} ? ?PT DURATION: {rehab duration:25117} ? ?PLANNED INTERVENTIONS: {rehab planned interventions:25118::"Therapeutic exercises","Therapeutic activity","Neuromuscular re-education","Balance training","Gait training","Patient/Family education","Joint mobilization"} ? ?PLAN FOR NEXT SESSION: *** ? ? ?Ardis Rowan, PT ?05/17/2021, 3:39 PM ? ?

## 2021-05-17 NOTE — Patient Instructions (Signed)
[place] at [facility] ?Discharge Instructions ? ?You were seen today by Tarri Abernethy PA-C for your abnormal labs.  You are sent to see [place] by your rheumatologist due to your elevated "erythrocyte sedimentation rate" (ESR).  This is a nonspecific test that can be abnormal from a variety of causes, including infection, inflammation, malignancy, obesity, anemia, and kidney disease. ? ?We will check labs today to try to determine what might be the cause of your elevated ESR. ? ?We also recommend that you remain up-to-date on age-appropriate cancer screenings: ?- Recommend referral for screening colonoscopy due to changes in bowel movements and family history of polyps ?- Recommend annual mammogram ? ?FOLLOW-UP APPOINTMENT: Office visit in 4 weeks to discuss lab results and next steps. ? ? ?Thank you for choosing [place] at [facility] to provide your oncology and hematology care.  To afford each patient quality time with our provider, please arrive at least 15 minutes before your scheduled appointment time.  ? ?If you have a lab appointment with the [place] please come in thru the Main Entrance and check in at the main information desk. ? ?You need to re-schedule your appointment should you arrive 10 or more minutes late.  We strive to give you quality time with our providers, and arriving late affects you and other patients whose appointments are after yours.  Also, if you no show three or more times for appointments you may be dismissed from the clinic at the providers discretion.     ?Again, thank you for choosing [facility].  Our hope is that these requests will decrease the amount of time that you wait before being seen by our physicians.       ?_____________________________________________________________ ? ?Should you have questions after your visit to [facility], please contact our office at [phone] and follow the  prompts.  Our office hours are 8:00 a.m. and 4:30 p.m. Monday - Friday.  Please note that voicemails left after 4:00 p.m. may not be returned until the following business day.  We are closed weekends and major holidays.  You do have access to a nurse 24-7, just call the main number to the clinic [phone] and do not press any options, hold on the line and a nurse will answer the phone.   ? ?For prescription refill requests, have your pharmacy contact our office and allow 72 hours.   ? ?Due to Covid, you will need to wear a mask upon entering the hospital. If you do not have a mask, a mask will be given to you at the Main Entrance upon arrival. For doctor visits, patients may have 1 support person age 42 or older with them. For treatment visits, patients can not have anyone with them due to social distancing guidelines and our immunocompromised population.  ? ? ? ?

## 2021-05-18 ENCOUNTER — Ambulatory Visit (HOSPITAL_COMMUNITY): Payer: Medicaid Other | Attending: Rheumatology

## 2021-05-18 DIAGNOSIS — M25562 Pain in left knee: Secondary | ICD-10-CM | POA: Insufficient documentation

## 2021-05-18 DIAGNOSIS — M170 Bilateral primary osteoarthritis of knee: Secondary | ICD-10-CM

## 2021-05-18 DIAGNOSIS — M797 Fibromyalgia: Secondary | ICD-10-CM

## 2021-05-18 DIAGNOSIS — M6281 Muscle weakness (generalized): Secondary | ICD-10-CM

## 2021-05-18 DIAGNOSIS — M25561 Pain in right knee: Secondary | ICD-10-CM | POA: Insufficient documentation

## 2021-05-18 DIAGNOSIS — M545 Low back pain, unspecified: Secondary | ICD-10-CM

## 2021-05-18 DIAGNOSIS — G8929 Other chronic pain: Secondary | ICD-10-CM

## 2021-05-18 NOTE — Therapy (Signed)
?OUTPATIENT PHYSICAL THERAPY LOWER EXTREMITY EVALUATION ? ? ?Patient Name: Selena Howard ?MRN: [id number] ?DOB:04-Sep-1975, 46 y.o., female ?Today's Date: 05/18/2021 ? ? PT End of Session - 05/18/21 0953   ? ? Visit Number 1   ? Number of Visits 3   ? Date for PT Re-Evaluation 06/08/21   ? Authorization Type Medicaid [employer] (check auth)   ? PT Start Time [phone]   ? PT Stop Time 1035   ? PT Time Calculation (min) 40 min   ? Activity Tolerance Patient tolerated treatment well   ? Behavior During Therapy [facility] for tasks assessed/performed   ? ?  ?  ? ?  ? ? ?Past Medical History:  ?Diagnosis Date  ? Anxiety   ? Arthritis   ? Endometriosis   ? now s/p partial hysterectomy 06/2020  ? 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 ?Past Surgical History:  ?Procedure Laterality Date  ? ABDOMINAL HYSTERECTOMY    ? partial, ovary sparing  ? CESAREAN SECTION    ? WISDOM TOOTH EXTRACTION Bilateral   ? ?Patient Active Problem List  ? Diagnosis Date Noted  ? Elevated sedimentation rate 05/17/2021  ? RLS (restless legs syndrome) 03/31/2021  ? 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 Vivid dream 03/31/2021  ? Snoring 03/31/2021  ? Chronic fatigue 03/31/2021  ? Excessive daytime sleepiness 03/31/2021  ? GERD with apnea 03/31/2021  ? Sleep related headaches 03/31/2021  ? Body aches 03/30/2021  ? Paresthesia 02/04/2021  ? Obstructive sleep apnea 02/04/2021  ? History of gastroesophageal reflux (GERD) 01/28/2021  ? Vitamin D deficiency 01/28/2021  ? History of anxiety 01/28/2021  ? Bilateral hand numbness 12/16/2020  ? Other intervertebral disc degeneration, lumbar region 12/16/2020  ? ? ?PCP: [name], DO ? ?REFERRING PROVIDER: Bo Merino, MD  ? ?REFERRING DIAG:  M17.0 (ICD-10-CM) - Primary osteoarthritis of both knees  ?M79.7 (ICD-10-CM) - Fibromyalgia  ? ?THERAPY DIAG:  ?Chronic bilateral low back pain, unspecified whether sciatica present ? ?Primary osteoarthritis of both knees ? ?Chronic pain of left knee ? ?Chronic pain  of right knee ? ?Muscle weakness (generalized) ? ?Fibromyalgia ? ?ONSET DATE: 08/2020 ? ?SUBJECTIVE:  ? ?SUBJECTIVE STATEMENT: ?Pt reports Rheumatologist sent pt back to PT with dx of Osteoarthritis in hand, feet, knees. DX with fibromyalgia within last 3 months. Pt had cortisone injection in right knee that helped for a it but did not last long. Patient last seen in PT 3 months ago for same condition and discharged at that time dure to lack of improvement in symptoms with therapy. Pt has minimally done HEP since discharge. Pt reports hearing left knee cap pop sometimes when going to stand. Low back pain radiating into hips, spinal stenosis L4-L5.pt now presents to PT for second course of PT. ? ?PERTINENT HISTORY: ?Anxiety, Arthritis, Endometriosis, and Spinal stenosis at L4-L5 level.Hysterectomy June 2022. long history of chronic multiple joints pain, intermittent bilateral hands and feet paresthesia, chronic low back pain  ? ?PAIN:  ?Are you having pain? Yes: NPRS scale: 7/10 ?Pain location: full body ?Pain description: aching. Burning sensation in right scap region. Dull pain in hips ?Aggravating factors: some days worse than others.  ?Relieving factors: none ? ?PRECAUTIONS: None ? ?WEIGHT BEARING RESTRICTIONS No ? ?FALLS:  ?Has patient fallen in last 6 months? No ? ?LIVING ENVIRONMENT: ?Lives with: lives with their family ?Lives in: House/apartment ?Stairs: No ?Has following equipment at home: Grab bars for parents at home  ? ?OCCUPATION: not working  ? ?  PLOF: Independent, Independent with gait, and Independent with transfers ? ?PATIENT GOALS to feel better. Build strength in legs and knees.  ? ? ?OBJECTIVE:  ? ?DIAGNOSTIC FINDINGS:  ? ?Knee  ?Moderate medial compartment narrowing was noted.  Moderate patellofemoral  ?narrowing was noted.  No chondrocalcinosis was noted.  ? ?Impression: These findings are consistent with moderate osteoarthritis and  ?moderate chondromalacia patella. ? ?Spine ?AP lateral lumbar  spine images with lateral flexion-extension views  ?obtained and reviewed.  This shows tiny endplate spurring mid lumbar  ?region without disc base narrowing.  Minimal facet enlargement at L5-S1.    ?Negative for compression fracture no spondylolisthesis.  ? ?Impression: Minimal endplate lumbar changes with mild facet degenerative  ?changes. ? ?COGNITION: ? Overall cognitive status: Within functional limits for tasks assessed   ?  ?SENSATION: ?WFL ? ? ?PALPATION: ?TTP throughout, multi joints B/L ? ? ?LE MMT: ? ?MMT Right ?05/18/2021 Left ?05/18/2021  ?Hip flexion 4/5 4/5  ?Hip extension 4/5 4/5  ?Hip abduction 4/5 4/5  ?Hip adduction 5/5 5/5  ?Hip internal rotation    ?Hip external rotation    ?Knee flexion 3+/5 3+/5  ?Knee extension 4+/5 4+/5  ?Ankle dorsiflexion 5/5 5/5  ?Ankle plantarflexion    ?Ankle inversion 5/5 5/5  ?Ankle eversion 5/5 5/5  ? (Blank rows = not tested) ? ?Lumbar AROM side bend R 24 degrees ?  Side bend L 20 degrees with pain on right side ? Flexion normal ranges with pain ? Extension 10 degrees with pain ? ? ?FUNCTIONAL TESTS:  ?5 times sit to stand: 22.14 sec ?2 MWT 436 feet ? ?GAIT: ?Distance walked: 436 feet ?Assistive device utilized: None ?Level of assistance: Complete Independence ?Comments: patient reports pain with walking increased times/distances ? ? ? ?TODAY'S TREATMENT: ?Evaluation ? ? ?PATIENT EDUCATION:  ?PATIENT EDUCATION:  ?Education details: Patient educated on exam findings, POC, scope of PT ?Person educated: Patient ?Education method: Explanation, Demonstration, and Handouts ?Education comprehension: verbalized understanding, returned demonstration, verbal cues required, and tactile cues required  ? ? ?HOME EXERCISE PROGRAM: ?To be dispensed next session ? ?ASSESSMENT: ? ?CLINICAL IMPRESSION: ?Patient a 46 y.o. female who was seen today for physical therapy evaluation and treatment for long history of chronic multiple joints pain,b/l knee pain intermittent bilateral hands and  feet paresthesia, chronic low back pain. Patient presents with good strength, hamstrings weakest in comparison to other muscle groups. Pt with good range of motion throughout. 7/10 pain through body with intermittent burning sensations in scapular region and low back. Patient educated on importance of healthy active lifestyle and prevention of further back impairments. Patient is independent in all ADLS, functional mobility but experiences joint pain throughout body in doing so. Patient will benefit from short course of PT to improve knowledge and education on HEP for low back/ LE strength and mobility.  ? ? ?OBJECTIVE IMPAIRMENTS decreased activity tolerance, decreased strength, obesity, and pain.  ? ?ACTIVITY LIMITATIONS cleaning, laundry, yard work, and shopping.  ? ?PERSONAL FACTORS 3+ comorbidities: anxiety, arthritis, fibromyalgia, spinal stenosis  are also affecting patient's functional outcome.  ? ? ?REHAB POTENTIAL: Good ? ?CLINICAL DECISION MAKING: Stable/uncomplicated ? ?EVALUATION COMPLEXITY: Moderate ? ? ?GOALS: ?Goals reviewed with patient? Yes ? ?SHORT TERM GOALS: Target date: 06/08/2021 ? ?Patient will be independent with HEP in order to improve functional outcomes. ?Baseline:  ?Goal status: INITIAL ? ?2.  Patient will report at least 25% improvement in symptoms for improved quality of life. ?Baseline: ?Goal status: INITIAL ? ? ?PLAN: ?PT  FREQUENCY: 1x/week ? ?PT DURATION: 3 weeks ? ?PLANNED INTERVENTIONS: PLANNED INTERVENTIONS: Therapeutic exercises, Therapeutic activity, Neuromuscular re-education, Balance training, Gait training, Patient/Family education, Joint manipulation, Joint mobilization, Stair training, Orthotic/Fit training, DME instructions, Aquatic Therapy, Dry Needling, Electrical stimulation, Spinal manipulation, Spinal mobilization, Cryotherapy, Moist heat, Compression bandaging, scar mobilization, Splintting, Taping, Traction, Ultrasound, Ionotophoresis '4mg'$ /ml Dexamethasone, and Manual  therapy  ?PLAN FOR NEXT SESSION: Comprehensive HEP for LE and low back strength and mobility ? ? ?Terran Klinke, PT ?05/18/2021, 10:54 AM  ?

## 2021-05-20 ENCOUNTER — Ambulatory Visit: Payer: Medicaid Other | Admitting: Family Medicine

## 2021-05-20 ENCOUNTER — Encounter: Payer: Self-pay | Admitting: Family Medicine

## 2021-05-20 VITALS — BP 127/84 | HR 87 | Temp 97.9°F | Ht 63.0 in | Wt 259.2 lb

## 2021-05-20 DIAGNOSIS — R002 Palpitations: Secondary | ICD-10-CM | POA: Diagnosis not present

## 2021-05-20 DIAGNOSIS — M797 Fibromyalgia: Secondary | ICD-10-CM

## 2021-05-20 DIAGNOSIS — R600 Localized edema: Secondary | ICD-10-CM

## 2021-05-20 DIAGNOSIS — L409 Psoriasis, unspecified: Secondary | ICD-10-CM

## 2021-05-20 MED ORDER — PREGABALIN 75 MG PO CAPS: 75.0000 mg | ORAL_CAPSULE | Freq: Two times a day (BID) | ORAL | 1 refills | Status: DC

## 2021-05-20 NOTE — Progress Notes (Signed)
Subjective: CC: Follow-up fibromyalgia, osteoarthritis PCP: [name], DO RDE:YCXK Paulsen is a 46 y.o. female presenting to clinic today for:  1.  Fibromyalgia with osteoarthritis Patient has been evaluated by rheumatology and she is had a negative autoimmune work-up.  She was diagnosed with fibromyalgia and started on Cymbalta.  She was not advised to discontinue her Prozac.  She continues to take her Prozac 20 mg along with Cymbalta 30 mg.  Does not report any signs or symptoms of serotonin syndrome but she has had a few days where she just felt totally worn out.  She was advised to advance the dose to 60 mg but has not yet done that because she had a lot of the 30 mg left over.  She did not tolerate the gabapentin because it made her "feel crazy".  And therefore if she has self weaned from that.  Next appointment with rheumatology is not for couple of months  2.  Rash Patient reports the rash on her right foot did respond to the betamethasone but has not resolved.  She would like to go ahead and proceed with dermatologic evaluation  3.  Edema/morbid obesity Patient reports bilateral lower extremity edema that is worse in the evening time.  She does not add any salt but does cook with foods that contain salt in them like spaghetti sauce that is prepackaged.  No significant physical activity.  She has not been contacted by [facility] but has reached out multiple times and unfortunately has not had success getting an appointment  4.  Heart palpitations Continues to have intermittent heart palpitations.  Has had cardiac work-up.   ROS: Per HPI  Allergies  Allergen Reactions   Keflex [Cephalexin] Nausea And Vomiting   Past Medical History:  Diagnosis Date   Anxiety    Arthritis    Endometriosis    now s/p partial hysterectomy 06/2020   Spinal stenosis at L4-L5 level     Current Outpatient Medications:    betamethasone valerate (VALISONE) 0.1 % cream,  APPLY TOPICALLY 2 (TWO) TIMES DAILY FOR 10-14 DAYS TO FOOT, Disp: 30 g, Rfl: 1   Cholecalciferol (VITAMIN D3) 1.25 MG (50000 UT) CAPS, Take 1 capsule by mouth once a week., Disp: , Rfl:    ciclopirox (PENLAC) 8 % solution, Apply topically daily., Disp: , Rfl:    DULoxetine (CYMBALTA) 30 MG capsule, Take 1 capsule (30 mg total) by mouth daily., Disp: 30 capsule, Rfl: 3   esomeprazole (NEXIUM) 40 MG capsule, TAKE 1 CAPSULE (40 MG TOTAL) BY MOUTH DAILY., Disp: 30 capsule, Rfl: 1   FLUoxetine (PROZAC) 20 MG capsule, Take 20 mg by mouth daily., Disp: , Rfl:    fluticasone (FLONASE) 50 MCG/ACT nasal spray, Place 2 sprays into both nostrils daily., Disp: 16 g, Rfl: 2   traMADol (ULTRAM) 50 MG tablet, Take 1 tablet (50 mg total) by mouth every 12 (twelve) hours as needed for severe pain. Put on file, Disp: 30 tablet, Rfl: 0 Social History   Socioeconomic History   Marital status: Divorced    Spouse name: Not on file   Number of children: 3   Years of education: 12   Highest education level: High school graduate  Occupational History   Not on file  Tobacco Use   Smoking status: Former    Packs/day: 0.50    Years: 10.00    Pack years: 5.00    Types: Cigarettes    Quit date: 06/02/2020  Years since quitting: 0.9    Passive exposure: Past   Smokeless tobacco: Never  Vaping Use   Vaping Use: Never used  Substance and Sexual Activity   Alcohol use: Not Currently   Drug use: Not Currently   Sexual activity: Not Currently  Other Topics Concern   Not on file  Social History Narrative   Right handed   Caffeine use: no tea, Coffee-tries to drink decaf, diet sodas   Mother also sees me, [name]   Social Determinants of Health   Financial Resource Strain: Not on file  Food Insecurity: Not on file  Transportation Needs: Not on file  Physical Activity: Not on file  Stress: Not on file  Social Connections: Not on file  Intimate Partner Violence: Not on file   Family History   Problem Relation Age of Onset   Diabetes Mother    Liver disease Mother    COPD Father    Diabetes Father    Heart disease Maternal Grandmother    Cancer Maternal Grandfather    Cancer Paternal Grandmother    Diabetes Paternal Grandfather     Objective: Office vital signs reviewed. BP 127/84   Pulse 87   Temp 97.9 F (36.6 C)   Ht '5\' 3"'$  (1.6 m)   Wt 259 lb 3.2 oz (117.6 kg)   SpO2 95%   BMI 45.92 kg/m   Physical Examination:  General: Awake, alert, morbidly obese, No acute distress HEENT: Sclera white. Cardio: regular rate and rhythm  Pulm: Normal work of breathing on room air Extremities: warm, well perfused, nonpitting edema present, no cyanosis or clubbing  MSK: Normal gait and station.  No gross joint deformity. Neuro: Alert and oriented  Assessment/ Plan: 46 y.o. female   Fibromyalgia - Plan: pregabalin (LYRICA) 75 MG capsule  Heart palpitations  Psoriasis - Plan: Ambulatory referral to Dermatology  Morbid obesity ([place])  Bilateral leg edema - Plan: Compression stockings  Chronic pain syndrome remains uncontrolled.  Has been evaluated extensively by rheumatology and no autoimmune etiology of her pain has been identified.  Unfortunately did not tolerate gabapentin so going to try Lyrica as I do think that this class of medicine works best in combination with her Cymbalta.  I am worried that she is being treated with both Cymbalta and Prozac so I have advised her to discontinue the Prozac and she may advance the Cymbalta to 60 mg as previously recommended by her specialist.  Monitor for signs and symptoms of serotonin syndrome.  National chronic database reviewed and there were no red flags.  Plan for [place] and UDS at next visit if we continue this Lyrica  Heart palpitations are still intermittent.  She had cardiac work-up and no clear etiology has been identified from a cardiac standpoint.  We discussed that we could consider adding a low-dose beta-blocker but I  favor trying to adjust her medications and improve her other issues prior to adding any more medicines to her list  Psoriasis has had some response to [place] steroid corticosteroid but no resolution.  For this reason we will refer to dermatology for further advice and recommendations  We discussed that unfortunately Medicaid does not pay for antiobesity medications.  I hesitate to add anything like phentermine even if she did pay out-of-pocket because it would likely increase her heart palpitations.  We discussed balanced diet.  For her leg edema I highly recommended that she avoid salt intake.  This includes salts which are used to preserve prepackaged  foods and takeout foods.  Compression stockings have been provided to the patient.  Could consider very low-dose diuretic as needed if she is not having success with these more conservative measures  We will reassess her in 1 month, sooner if concerns arise  No orders of the defined types were placed in this encounter.  No orders of the defined types were placed in this encounter.    [name], DO [place] [phone]

## 2021-05-20 NOTE — Patient Instructions (Addendum)
STOP the Fluoxetine.  Proceed with increased Cymbalta dose. Lyrica added today to REPLACE gabapentin [place] to contact you regarding [facility]. Get the compression hose for the legs Avoid SALT! Edema  Edema is when you have too much fluid in your body or under your skin. Edema may make your legs, feet, and ankles swell. Swelling often happens in looser tissues, such as around your eyes. This is a common condition. It gets more common as you get older. There are many possible causes of edema. These include: Eating too much salt (sodium). Being on your feet or sitting for a long time. Certain medical conditions, such as: Pregnancy. Heart failure. Liver disease. Kidney disease. Cancer. Hot weather may make edema worse. Edema is usually painless. Your skin may look swollen or shiny. Follow these instructions at home: Medicines Take over-the-counter and prescription medicines only as told by your doctor. Your doctor may prescribe a medicine to help your body get rid of extra water (diuretic). Take this medicine if you are told to take it. Eating and drinking Eat a low-salt (low-sodium) diet as told by your doctor. Sometimes, eating less salt may reduce swelling. Depending on the cause of your swelling, you may need to limit how much fluid you drink (fluid restriction). General instructions Raise the injured area above the level of your heart while you are sitting or lying down. Do not sit still or stand for a long time. Do not wear tight clothes. Do not wear garters on your upper legs. Exercise your legs. This can help the swelling go down. Wear compression stockings as told by your doctor. It is important that these are the right size. These should be prescribed by your doctor to prevent possible injuries. If elastic bandages or wraps are recommended, use them as told by your doctor. Contact a doctor if: Treatment is not working. You have heart, liver, or kidney  disease and have symptoms of edema. You have sudden and unexplained weight gain. Get help right away if: You have shortness of breath or chest pain. You cannot breathe when you lie down. You have pain, redness, or warmth in the swollen areas. You have heart, liver, or kidney disease and get edema all of a sudden. You have a fever and your symptoms get worse all of a sudden. These symptoms may be an emergency. Get help right away. Call 911. Do not wait to see if the symptoms will go away. Do not drive yourself to the hospital. Summary Edema is when you have too much fluid in your body or under your skin. Edema may make your legs, feet, and ankles swell. Swelling often happens in looser tissues, such as around your eyes. Raise the injured area above the level of your heart while you are sitting or lying down. Follow your doctor's instructions about diet and how much fluid you can drink. This information is not intended to replace advice given to you by your health care provider. Make sure you discuss any questions you have with your health care provider. Document Revised: 08/23/2020 Document Reviewed: 08/23/2020 Elsevier Patient Education  [place].

## 2021-05-23 ENCOUNTER — Encounter (HOSPITAL_COMMUNITY): Payer: Self-pay | Admitting: *Deleted

## 2021-05-25 ENCOUNTER — Ambulatory Visit (HOSPITAL_COMMUNITY): Payer: Medicaid Other

## 2021-05-25 ENCOUNTER — Other Ambulatory Visit: Payer: Self-pay | Admitting: Family Medicine

## 2021-05-25 DIAGNOSIS — K219 Gastro-esophageal reflux disease without esophagitis: Secondary | ICD-10-CM

## 2021-05-31 ENCOUNTER — Encounter: Payer: Self-pay | Admitting: *Deleted

## 2021-06-06 ENCOUNTER — Ambulatory Visit (HOSPITAL_COMMUNITY)
Admission: RE | Admit: 2021-06-06 | Discharge: 2021-06-06 | Disposition: A | Discharge status: Home / Self Care | Payer: Medicaid Other | Source: Ambulatory Visit | Attending: Physician Assistant | Admitting: Physician Assistant

## 2021-06-06 DIAGNOSIS — Z1231 Encounter for screening mammogram for malignant neoplasm of breast: Secondary | ICD-10-CM | POA: Insufficient documentation

## 2021-06-06 NOTE — Progress Notes (Unsigned)
[place], [place] [id number]   CLINIC:  Medical Oncology/Hematology  PCP:  [name], DO [place] [address] [phone]   REASON FOR VISIT:  Follow-up for elevated ESR  PRIOR THERAPY: None  CURRENT THERAPY: Under work-up  INTERVAL HISTORY:  [name] 46 y.o. female returns for routine follow-up of elevated ESR in the absence of autoimmune disease.  She was seen for initial consultation by Dr. Delton Coombes and Casey Burkitt PA-C on 05/17/2021.  At today's visit, she reports feeling fair depending on the day.  She has not had any changes in her health status since her initial consultation 3 weeks ago.  She continues to deny any recent infections, injury, or trauma.  No changes in her bladder or breathing habits.  She continues to have occasionally narrow stools, which is a change in caliber over the past year (scheduled for GI consultation on 06/27/2021).    She has some other medical complaints unrelated to today's visit, including ongoing pain from her fibromyalgia, as well as unplanned weight gain, nocturia, increased flatulence, nausea, palpitations, headaches, neuropathy, fatigue, and shortness of breath on exertion.  She has been encouraged to follow-up with her primary care provider for these concerns.  She has 40% energy and 70% appetite. She endorses that she is maintaining a stable weight.   REVIEW OF SYSTEMS:  Review of Systems  Constitutional:  Positive for fatigue and unexpected weight change (weight gain). Negative for appetite change, chills, diaphoresis and fever.  HENT:   Negative for lump/mass and nosebleeds.   Eyes:  Negative for eye problems.  Respiratory:  Positive for shortness of breath (with exertion). Negative for cough and hemoptysis.   Cardiovascular:  Positive for palpitations. Negative for chest pain and leg swelling.  Gastrointestinal:  Positive for nausea. Negative for abdominal  pain, blood in stool, constipation, diarrhea and vomiting.       Increased flatulence.  Genitourinary:  Positive for nocturia. Negative for hematuria.   Musculoskeletal:  Positive for arthralgias, back pain and myalgias.  Skin: Negative.   Neurological:  Positive for headaches and numbness. Negative for dizziness and light-headedness.  Hematological:  Does not bruise/bleed easily.     PAST MEDICAL/SURGICAL HISTORY:  Past Medical History:  Diagnosis Date   Anxiety    Arthritis    Endometriosis    now s/p partial hysterectomy 06/2020   Spinal stenosis at L4-L5 level    Past Surgical History:  Procedure Laterality Date   ABDOMINAL HYSTERECTOMY     partial, ovary sparing   CESAREAN SECTION     WISDOM TOOTH EXTRACTION Bilateral      SOCIAL HISTORY:  Social History   Socioeconomic History   Marital status: Divorced    Spouse name: Not on file   Number of children: 3   Years of education: 12   Highest education level: High school graduate  Occupational History   Not on file  Tobacco Use   Smoking status: Former    Packs/day: 0.50    Years: 10.00    Pack years: 5.00    Types: Cigarettes    Quit date: 06/02/2020    Years since quitting: 1.0    Passive exposure: Past   Smokeless tobacco: Never  Vaping Use   Vaping Use: Never used  Substance and Sexual Activity   Alcohol use: Not Currently   Drug use: Not Currently   Sexual activity: Not Currently  Other Topics Concern   Not  on file  Social History Narrative   Right handed   Caffeine use: no tea, Coffee-tries to drink decaf, diet sodas   Mother also sees me, Pensions consultant   Social Determinants of Health   Financial Resource Strain: Not on file  Food Insecurity: Not on file  Transportation Needs: Not on file  Physical Activity: Not on file  Stress: Not on file  Social Connections: Not on file  Intimate Partner Violence: Not on file    FAMILY HISTORY:  Family History  Problem Relation Age of Onset    Diabetes Mother    Liver disease Mother    COPD Father    Diabetes Father    Heart disease Maternal Grandmother    Cancer Maternal Grandfather    Cancer Paternal Grandmother    Diabetes Paternal Grandfather     CURRENT MEDICATIONS:  Outpatient Encounter Medications as of 06/07/2021  Medication Sig   betamethasone valerate (VALISONE) 0.1 % cream APPLY TOPICALLY 2 (TWO) TIMES DAILY FOR 10-14 DAYS TO FOOT   Cholecalciferol (VITAMIN D3) 1.25 MG (50000 UT) CAPS Take 1 capsule by mouth once a week.   ciclopirox (PENLAC) 8 % solution Apply topically daily.   DULoxetine (CYMBALTA) 30 MG capsule Take 1 capsule (30 mg total) by mouth daily.   esomeprazole (NEXIUM) 40 MG capsule Take 1 capsule by mouth once daily   fluticasone (FLONASE) 50 MCG/ACT nasal spray Place 2 sprays into both nostrils daily.   pregabalin (LYRICA) 75 MG capsule Take 1 capsule (75 mg total) by mouth 2 (two) times daily.   traMADol (ULTRAM) 50 MG tablet Take 1 tablet (50 mg total) by mouth every 12 (twelve) hours as needed for severe pain. Put on file   No facility-administered encounter medications on file as of 06/07/2021.    ALLERGIES:  Allergies  Allergen Reactions   Keflex [Cephalexin] Nausea And Vomiting     PHYSICAL EXAM:  ECOG PERFORMANCE STATUS: 1 - Symptomatic but completely ambulatory  There were no vitals filed for this visit. There were no vitals filed for this visit. Physical Exam Constitutional:      Appearance: Normal appearance. She is morbidly obese.  HENT:     Head: Normocephalic and atraumatic.     Mouth/Throat:     Mouth: Mucous membranes are moist.  Eyes:     Extraocular Movements: Extraocular movements intact.     Pupils: Pupils are equal, round, and reactive to light.  Cardiovascular:     Rate and Rhythm: Normal rate and regular rhythm.     Pulses: Normal pulses.     Heart sounds: Normal heart sounds.  Pulmonary:     Effort: Pulmonary effort is normal.     Breath sounds: Normal  breath sounds.  Abdominal:     General: Bowel sounds are normal.     Palpations: Abdomen is soft.     Tenderness: There is no abdominal tenderness.  Musculoskeletal:        General: No swelling.     Right lower leg: No edema.     Left lower leg: No edema.  Lymphadenopathy:     Cervical: No cervical adenopathy.  Skin:    General: Skin is warm and dry.  Neurological:     General: No focal deficit present.     Mental Status: She is alert and oriented to person, place, and time.  Psychiatric:        Mood and Affect: Mood normal.        Behavior: Behavior normal.  LABORATORY DATA:  I have reviewed the labs as listed.  CBC    Component Value Date/Time   WBC 5.3 05/17/2021 0936   RBC 4.17 05/17/2021 0936   HGB 12.3 05/17/2021 0936   HGB 12.5 11/30/2020 0838   HCT 37.8 05/17/2021 0936   HCT 37.8 11/30/2020 0838   PLT 259 05/17/2021 0936   PLT 220 11/30/2020 0838   MCV 90.6 05/17/2021 0936   MCV 86 11/30/2020 0838   MCH 29.5 05/17/2021 0936   MCHC 32.5 05/17/2021 0936   RDW 15.2 05/17/2021 0936   RDW 14.4 11/30/2020 0838   LYMPHSABS 2.1 05/17/2021 0936   MONOABS 0.4 05/17/2021 0936   EOSABS 0.0 05/17/2021 0936   BASOSABS 0.0 05/17/2021 0936      Latest Ref Rng & Units 05/17/2021    9:36 AM 11/30/2020    8:38 AM  CMP  Glucose 70 - 99 mg/dL 93   91    BUN 6 - 20 mg/dL 14   23    Creatinine 0.44 - 1.00 mg/dL 0.77   1.07    Sodium 135 - 145 mmol/L 138   140    Potassium 3.5 - 5.1 mmol/L 4.3   4.1    Chloride 98 - 111 mmol/L 106   102    CO2 22 - 32 mmol/L 30   21    Calcium 8.9 - 10.3 mg/dL 8.6   8.9    Total Protein 6.5 - 8.1 g/dL 6.6   6.4    Total Bilirubin 0.3 - 1.2 mg/dL 0.6   0.3    Alkaline Phos 38 - 126 U/L 68   71    AST 15 - 41 U/L 16   15    ALT 0 - 44 U/L 17   13      DIAGNOSTIC IMAGING:  I have independently reviewed the relevant imaging and discussed with the patient.  ASSESSMENT & PLAN: 1.  Elevated ESR - Sent by rheumatology (Dr. Estanislado Pandy)  for evaluation of elevated ESR in the absence of autoimmune disease - Review of past labs shows mildly elevated ESR 34 (11/30/2020), ESR 31 (01/28/2021), and ESR 40 (03/30/2021).  CRP was also elevated at 19 (11/30/2020) and 33 (03/30/2021). - No recent infections, injury, or trauma.  She is a former smoker, quit approximately 1 year ago. - Patient is morbidly obese with BMI 45. - Mammogram 2 years ago reportedly normal.  Mammogram (06/06/2021) with radiology read pending, but no major abnormalities or evidence of malignancy per my interpretation. - Colonoscopy approximately 7 to 8 years ago reportedly normal apart from polyp x1.  (Colonoscopy was obtained due to mother having history of many (30+) colonic polyps) - She has fibromyalgia and osteoarthritis.  She has symptoms of chronic fatigue and joint pain.  No B symptoms or unintentional weight loss.  - No signs of anemia or CKD as cause of elevated ESR.  (CBC negative for anemia with normal Hgb 12.3.  CMP shows normal kidney function with creatinine 0.77). - Discussed with patient that elevated ESR is nonspecific finding that can be due to inflammation, infection, malignancy, and various other chronic conditions. - Mild elevation in ESR (<100) unlikely to represent a occult malignancy in the absence of other clinical signs or symptoms - Suspect that patient has secondary elevations in ESR as acute phase reactant and/or in response to chronic inflammation.  Would strongly consider that this may be elevated ESR and CRP in the setting of morbid obesity with BMI 45. -  PLAN: No clinical evidence of malignancy at this time.  Suspect elevated ESR and CRP in the setting of morbid obesity. - Recommend that she stay up-to-date on age-appropriate screenings for malignancy.  We ordered screening mammogram, which was obtained on 06/06/2021.  No signs of malignancy in either breast.  Patient should follow-up with PCP for annual mammogram.  She has been referred for  screening colonoscopy (she reports changes in stool caliber over the past year, history of many polyps in mother) - scheduled to meet with gastroenterologist on 06/27/2021 - We will tentatively discharge from clinic at this time, but patient should be referred back to [place] on an as-needed basis in the future if indicated.   2.  Other history - PMH: Osteoarthritis, endometriosis s/p hysterectomy, degenerative disc disease with spinal stenosis, anxiety, and fibromyalgia.  Morbid obesity. - SOCIAL: She currently lives with her parents.  She is currently unemployed, previously worked in a [employer].  No history of alcohol or drug use.  She is a former smoker who smoked intermittently for approximately 20 years.  She quit smoking entirely approximately 1 year ago. - FAMILY: Maternal grandfather with prostate cancer.  Paternal grandmother with unspecified "tumor in her head."  Cousin on paternal side with multiple autoimmune conditions including lupus.  Both parents with fibromyalgia.    All questions were answered. The patient knows to call the clinic with any problems, questions or concerns.  Medical decision making: Low  Time spent on visit: I spent 15 minutes counseling the patient face to face. The total time spent in the appointment was 22 minutes and more than 50% was on counseling.   Harriett Rush, PA-C  06/07/21 11:10 AM

## 2021-06-07 ENCOUNTER — Inpatient Hospital Stay (HOSPITAL_COMMUNITY): Payer: Medicaid Other | Attending: Hematology | Admitting: Physician Assistant

## 2021-06-07 VITALS — BP 135/82 | HR 90 | Temp 97.5°F | Resp 16 | Ht 63.0 in | Wt 264.3 lb

## 2021-06-07 DIAGNOSIS — M1990 Unspecified osteoarthritis, unspecified site: Secondary | ICD-10-CM | POA: Insufficient documentation

## 2021-06-07 DIAGNOSIS — Z8042 Family history of malignant neoplasm of prostate: Secondary | ICD-10-CM | POA: Insufficient documentation

## 2021-06-07 DIAGNOSIS — R700 Elevated erythrocyte sedimentation rate: Secondary | ICD-10-CM | POA: Diagnosis present

## 2021-06-07 DIAGNOSIS — G629 Polyneuropathy, unspecified: Secondary | ICD-10-CM | POA: Diagnosis not present

## 2021-06-07 DIAGNOSIS — Z6841 Body Mass Index (BMI) 40.0 and over, adult: Secondary | ICD-10-CM | POA: Diagnosis not present

## 2021-06-07 DIAGNOSIS — M797 Fibromyalgia: Secondary | ICD-10-CM | POA: Insufficient documentation

## 2021-06-07 DIAGNOSIS — F419 Anxiety disorder, unspecified: Secondary | ICD-10-CM | POA: Diagnosis not present

## 2021-06-07 DIAGNOSIS — Z87891 Personal history of nicotine dependence: Secondary | ICD-10-CM | POA: Insufficient documentation

## 2021-06-07 DIAGNOSIS — Z90710 Acquired absence of both cervix and uterus: Secondary | ICD-10-CM | POA: Insufficient documentation

## 2021-06-07 NOTE — Patient Instructions (Addendum)
[place] at [facility] Discharge Instructions  You were seen today by Tarri Abernethy PA-C for your elevated ESR (erythrocyte sedimentation rate).  Elevated ESR can be due to a variety of causes, including infection, inflammation, malignancy, anemia, chronic kidney disease, or pregnancy.  We checked your blood count and kidney function, and you do not have any anemia or chronic kidney disease at this time.  You do not have any clinical signs of malignancy or cancer at this time.  You should continue to stay up-to-date on your age-appropriate cancer screenings, including annual mammograms.  You have been referred for screening colonoscopy due to your bowel habits.  I suspect that your elevated ESR is due to inflammation from morbid obesity.  Studies show that there is a strong correlation between high body mass index and elevated ESR.  You do not need to continue to follow-up at the cancer center for the time being.  We will discharge you for ongoing follow-up with your primary care provider, but you can be referred back to [place] in the future if needed.    Thank you for choosing [place] at [facility] to provide your oncology and hematology care.  To afford each patient quality time with our provider, please arrive at least 15 minutes before your scheduled appointment time.   If you have a lab appointment with the [facility] please come in thru the Main Entrance and check in at the main information desk.  You need to re-schedule your appointment should you arrive 10 or more minutes late.  We strive to give you quality time with our providers, and arriving late affects you and other patients whose appointments are after yours.  Also, if you no show three or more times for appointments you may be dismissed from the clinic at the providers discretion.     Again, thank you for choosing [facility].  Our hope is that these requests  will decrease the amount of time that you wait before being seen by our physicians.       _____________________________________________________________  Should you have questions after your visit to [facility], please contact our office at [phone] and follow the prompts.  Our office hours are 8:00 a.m. and 4:30 p.m. Monday - Friday.  Please note that voicemails left after 4:00 p.m. may not be returned until the following business day.  We are closed weekends and major holidays.  You do have access to a nurse 24-7, just call the main number to the clinic [phone] and do not press any options, hold on the line and a nurse will answer the phone.    For prescription refill requests, have your pharmacy contact our office and allow 72 hours.    Due to Covid, you will need to wear a mask upon entering the hospital. If you do not have a mask, a mask will be given to you at the Main Entrance upon arrival. For doctor visits, patients may have 1 support person age 68 or older with them. For treatment visits, patients can not have anyone with them due to social distancing guidelines and our immunocompromised population.

## 2021-06-08 ENCOUNTER — Ambulatory Visit (HOSPITAL_COMMUNITY): Payer: Medicaid Other | Admitting: Physician Assistant

## 2021-06-08 ENCOUNTER — Encounter (HOSPITAL_COMMUNITY): Payer: Medicaid Other

## 2021-06-10 ENCOUNTER — Telehealth: Payer: Self-pay | Admitting: Family Medicine

## 2021-06-10 NOTE — Telephone Encounter (Signed)
Please advise Historical med. Last Vit D level scanned in 02/12/20 31.1 Next OV 06/21/21

## 2021-06-10 NOTE — Telephone Encounter (Signed)
  Prescription Request  06/10/2021  Is this a "Controlled Substance" medicine? no  Have you seen your PCP in the last 2 weeks? No, appt 6/20   If YES, route message to pool  -  If NO, patient needs to be scheduled for appointment.  What is the name of the medication or equipment? Cholecalciferol (VITAMIN D3) 1.25 MG (50000 UT) CAPS  Have you contacted your pharmacy to request a refill? yes   Which pharmacy would you like this sent to? [facility] in [place]   Patient notified that their request is being sent to the clinical staff for review and that they should receive a response within 2 business days.

## 2021-06-10 NOTE — Telephone Encounter (Signed)
Needs vitamin d level first.

## 2021-06-13 NOTE — Telephone Encounter (Signed)
Will check at next ov

## 2021-06-14 ENCOUNTER — Other Ambulatory Visit: Payer: Self-pay | Admitting: Family Medicine

## 2021-06-14 DIAGNOSIS — K219 Gastro-esophageal reflux disease without esophagitis: Secondary | ICD-10-CM

## 2021-06-15 ENCOUNTER — Encounter (HOSPITAL_COMMUNITY): Payer: Medicaid Other

## 2021-06-16 ENCOUNTER — Ambulatory Visit (HOSPITAL_COMMUNITY): Payer: Medicaid Other | Admitting: Physician Assistant

## 2021-06-21 ENCOUNTER — Ambulatory Visit: Payer: Medicaid Other | Admitting: Family Medicine

## 2021-06-21 ENCOUNTER — Encounter: Payer: Self-pay | Admitting: Family Medicine

## 2021-06-21 DIAGNOSIS — R600 Localized edema: Secondary | ICD-10-CM | POA: Diagnosis not present

## 2021-06-21 DIAGNOSIS — M797 Fibromyalgia: Secondary | ICD-10-CM | POA: Diagnosis not present

## 2021-06-21 NOTE — Patient Instructions (Signed)
Referral to NUTRITION placed.  The weight and wellness center has a wait but you should be able to talk to a dietician soon Keep a food journal. Write EVERYTHING you eat and drink down and HOW much you consume.  Have this ready before the nutrition appointment Get rid of salt in the diet Wear the compression hose.

## 2021-06-21 NOTE — Progress Notes (Signed)
Subjective: CC: Follow-up morbid obesity, fibromyalgia PCP: [name], DO ASN:KNLZ Selena Howard is a 46 y.o. female presenting to clinic today for:  1.  Fibromyalgia/morbid obesity At last visit we advanced her Cymbalta and discontinued her Prozac.  Lyrica sent.  She has not noticed really any improvement in her pain since last visit.  She has a follow-up with her specialist in a few weeks.  She continues to sit quite a bit due to chronic pain issues and notes subsequent weight gain despite not having relief any big changes in her diet.  Unfortunately, she is not able to get healthy weight and wellness for some time now.  She does not feel like she "eats bad".  She never add salt but admits that she eats a lot of prepared foods that may have salt in them.  She has been having some lower extremity edema and sometimes she notes that the pain gets really bad in the legs because of the pressure when walking.  Again she does sit a lot and consume salt as above   ROS: Per HPI  Allergies  Allergen Reactions   Keflex [Cephalexin] Nausea And Vomiting   Past Medical History:  Diagnosis Date   Anxiety    Arthritis    Endometriosis    now s/p partial hysterectomy 06/2020   Spinal stenosis at L4-L5 level     Current Outpatient Medications:    amoxicillin (AMOXIL) 875 MG tablet, Take 875 mg by mouth 2 (two) times daily., Disp: , Rfl:    betamethasone valerate (VALISONE) 0.1 % cream, APPLY TOPICALLY 2 (TWO) TIMES DAILY FOR 10-14 DAYS TO FOOT, Disp: 30 g, Rfl: 1   Cholecalciferol (VITAMIN D3) 1.25 MG (50000 UT) CAPS, Take 1 capsule by mouth once a week., Disp: , Rfl:    ciclopirox (PENLAC) 8 % solution, Apply topically daily., Disp: , Rfl:    DULoxetine (CYMBALTA) 30 MG capsule, Take 1 capsule (30 mg total) by mouth daily., Disp: 30 capsule, Rfl: 3   DULoxetine (CYMBALTA) 60 MG capsule, Take 60 mg by mouth daily., Disp: , Rfl:    esomeprazole (NEXIUM) 40 MG capsule, Take 1 capsule by mouth  once daily, Disp: 30 capsule, Rfl: 0   FLUoxetine (PROZAC) 20 MG capsule, Take 1 capsule by mouth daily., Disp: , Rfl:    fluticasone (FLONASE) 50 MCG/ACT nasal spray, Place 2 sprays into both nostrils daily., Disp: 16 g, Rfl: 2   pregabalin (LYRICA) 75 MG capsule, Take 1 capsule (75 mg total) by mouth 2 (two) times daily., Disp: 60 capsule, Rfl: 1   tacrolimus (PROTOPIC) 0.1 % ointment, Apply topically 2 (two) times daily., Disp: , Rfl:    traMADol (ULTRAM) 50 MG tablet, Take 1 tablet (50 mg total) by mouth every 12 (twelve) hours as needed for severe pain. Put on file, Disp: 30 tablet, Rfl: 0 Social History   Socioeconomic History   Marital status: Divorced    Spouse name: Not on file   Number of children: 3   Years of education: 12   Highest education level: High school graduate  Occupational History   Not on file  Tobacco Use   Smoking status: Former    Packs/day: 0.50    Years: 10.00    Total pack years: 5.00    Types: Cigarettes    Quit date: 06/02/2020    Years since quitting: 1.0    Passive exposure: Past   Smokeless tobacco: Never  Vaping Use   Vaping Use: Never used  Substance and Sexual Activity   Alcohol use: Not Currently   Drug use: Not Currently   Sexual activity: Not Currently  Other Topics Concern   Not on file  Social History Narrative   Right handed   Caffeine use: no tea, Coffee-tries to drink decaf, diet sodas   Mother also sees me, [name]   Social Determinants of Health   Financial Resource Strain: Not on file  Food Insecurity: Not on file  Transportation Needs: Not on file  Physical Activity: Not on file  Stress: Not on file  Social Connections: Not on file  Intimate Partner Violence: Not on file   Family History  Problem Relation Age of Onset   Diabetes Mother    Liver disease Mother    COPD Father    Diabetes Father    Heart disease Maternal Grandmother    Cancer Maternal Grandfather    Cancer Paternal Grandmother    Diabetes  Paternal Grandfather     Objective: Office vital signs reviewed. BP 125/80   Pulse 85   Temp 97.9 F (36.6 C)   Ht '5\' 3"'$  (1.6 m)   Wt 268 lb 3.2 oz (121.7 kg)   SpO2 97%   BMI 47.51 kg/m   Physical Examination:  General: Awake, alert, morbidly obese, No acute distress HEENT: No goiter.  No exophthalmos. Cardio: regular rate and rhythm  Pulm:   normal work of breathing on room air Extremities: warm, well perfused, nonpitting leg edema, no cyanosis or clubbing; +2 pulses bilaterally MSK: Ambulating independently with normal gait and station.  No gross joint deformity, soft tissue swelling or erythema noted  Assessment/ Plan: 46 y.o. female   Morbid obesity ([place]) - Plan: Amb ref to Medical Nutrition Therapy-MNT  Bilateral leg edema - Plan: Compression stockings  Fibromyalgia  Continues to struggle with weight.  Unfortunately healthy weight and wellness has quite a long wait list.  Referral to medical nutritional therapy.  We discussed the salt restriction.  We discussed really that she needs caloric restriction since she is not physically active at all due to chronic pain.  We discussed use of compression hose.  We will reevaluate again in 1 month and if persistent issues with lower extremity edema we can consider diuretic.  Continue to follow-up with her specialist with regards to fibromyalgia.  She continues not really to respond to the appropriate medications for fibromyalgia which is somewhat confounding.  No orders of the defined types were placed in this encounter.  No orders of the defined types were placed in this encounter.    [name], DO [place] [phone]

## 2021-06-22 ENCOUNTER — Ambulatory Visit (HOSPITAL_COMMUNITY): Payer: Medicaid Other | Attending: Rheumatology

## 2021-06-22 DIAGNOSIS — M170 Bilateral primary osteoarthritis of knee: Secondary | ICD-10-CM | POA: Diagnosis present

## 2021-06-22 DIAGNOSIS — M25562 Pain in left knee: Secondary | ICD-10-CM | POA: Diagnosis present

## 2021-06-22 DIAGNOSIS — G8929 Other chronic pain: Secondary | ICD-10-CM | POA: Insufficient documentation

## 2021-06-22 DIAGNOSIS — M545 Low back pain, unspecified: Secondary | ICD-10-CM | POA: Insufficient documentation

## 2021-06-22 DIAGNOSIS — M797 Fibromyalgia: Secondary | ICD-10-CM | POA: Diagnosis present

## 2021-06-22 DIAGNOSIS — M6281 Muscle weakness (generalized): Secondary | ICD-10-CM | POA: Diagnosis present

## 2021-06-22 DIAGNOSIS — M25561 Pain in right knee: Secondary | ICD-10-CM | POA: Diagnosis present

## 2021-06-22 NOTE — Therapy (Signed)
OUTPATIENT PHYSICAL THERAPY LOWER EXTREMITY EVALUATION   Patient Name: Selena Howard MRN: [id number] DOB:[birth year], 46 y.o., female Today's Date: 06/22/2021   PT End of Session - 06/22/21 1036     Visit Number 2    Number of Visits 3    Date for PT Re-Evaluation 06/08/21    Authorization Type Medicaid [employer]    Authorization Time Period 5 visits from 05/18/21-07/16/21    Authorization - Visit Number 2    Authorization - Number of Visits 5    Progress Note Due on Visit 3    PT Start Time 1032    PT Stop Time 1112    PT Time Calculation (min) 40 min    Activity Tolerance Patient tolerated treatment well    Behavior During Therapy [facility] for tasks assessed/performed              Past Medical History:  Diagnosis Date   Anxiety    Arthritis    Endometriosis    now s/p partial hysterectomy 06/2020   Spinal stenosis at L4-L5 level    Past Surgical History:  Procedure Laterality Date   ABDOMINAL HYSTERECTOMY     partial, ovary sparing   CESAREAN SECTION     WISDOM TOOTH EXTRACTION Bilateral    Patient Active Problem List   Diagnosis Date Noted   Elevated sedimentation rate 05/17/2021   RLS (restless legs syndrome) 03/31/2021   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   Vivid dream 03/31/2021   Snoring 03/31/2021   Chronic fatigue 03/31/2021   Excessive daytime sleepiness 03/31/2021   GERD with apnea 03/31/2021   Sleep related headaches 03/31/2021   Body aches 03/30/2021   Paresthesia 02/04/2021   Obstructive sleep apnea 02/04/2021   History of gastroesophageal reflux (GERD) 01/28/2021   Vitamin D deficiency 01/28/2021   History of anxiety 01/28/2021   Bilateral hand numbness 12/16/2020   Other intervertebral disc degeneration, lumbar region 12/16/2020    PCP: [name], DO  REFERRING PROVIDER: Bo Merino, MD   REFERRING DIAG:  M17.0 (ICD-10-CM) - Primary osteoarthritis of both knees  M79.7 (ICD-10-CM) - Fibromyalgia   THERAPY DIAG:   Muscle weakness (generalized)  Primary osteoarthritis of both knees  Chronic bilateral low back pain, unspecified whether sciatica present  Chronic pain of left knee  Chronic pain of right knee  Fibromyalgia  ONSET DATE: 08/2020  SUBJECTIVE:   SUBJECTIVE STATEMENT: Patient reports back is hurting worse than knees; damp weather seems to impact her pain.  Continues to have disturbed sleep. Left heel is starting to hurt first thing in the morning. Patient reports frustration with weight gain of about 40 lbs since hysterectomy June of last year; feels this is affecting her pain.   PERTINENT HISTORY: Anxiety, Arthritis, Endometriosis, and Spinal stenosis at L4-L5 level.Hysterectomy June 2022. long history of chronic multiple joints pain, intermittent bilateral hands and feet paresthesia, chronic low back pain   PAIN:  Are you having pain? Yes: NPRS scale: 8/10 Pain location: back Pain description: aching. Burning sensation in right scap region. Dull pain in hips Aggravating factors: some days worse than others.  Relieving factors: none  PRECAUTIONS: None  WEIGHT BEARING RESTRICTIONS No  FALLS:  Has patient fallen in last 6 months? No  LIVING ENVIRONMENT: Lives with: lives with their family Lives in: House/apartment Stairs: No Has following equipment at home: Grab bars for parents at home   OCCUPATION: not working   PLOF: Independent, Independent with gait, and Independent with transfers  PATIENT GOALS to  feel better. Build strength in legs and knees.    OBJECTIVE:   DIAGNOSTIC FINDINGS:   Knee  Moderate medial compartment narrowing was noted.  Moderate patellofemoral  narrowing was noted.  No chondrocalcinosis was noted.   Impression: These findings are consistent with moderate osteoarthritis and  moderate chondromalacia patella.  Spine AP lateral lumbar spine images with lateral flexion-extension views  obtained and reviewed.  This shows tiny endplate  spurring mid lumbar  region without disc base narrowing.  Minimal facet enlargement at L5-S1.    Negative for compression fracture no spondylolisthesis.   Impression: Minimal endplate lumbar changes with mild facet degenerative  changes.  COGNITION:  Overall cognitive status: Within functional limits for tasks assessed     SENSATION: WFL   PALPATION: TTP throughout, multi joints B/L   LE MMT:  MMT Right 05/18/2021 Left 05/18/2021  Hip flexion 4/5 4/5  Hip extension 4/5 4/5  Hip abduction 4/5 4/5  Hip adduction 5/5 5/5  Hip internal rotation    Hip external rotation    Knee flexion 3+/5 3+/5  Knee extension 4+/5 4+/5  Ankle dorsiflexion 5/5 5/5  Ankle plantarflexion    Ankle inversion 5/5 5/5  Ankle eversion 5/5 5/5   (Blank rows = not tested)  Lumbar AROM side bend R 24 degrees   Side bend L 20 degrees with pain on right side  Flexion normal ranges with pain  Extension 10 degrees with pain   FUNCTIONAL TESTS:  5 times sit to stand: 22.14 sec 2 MWT 436 feet  GAIT: Distance walked: 436 feet Assistive device utilized: None Level of assistance: Complete Independence Comments: patient reports pain with walking increased times/distances    TODAY'S TREATMENT: Review of goals set at evaluation Initiated HEP  Supine: LTR x 10 Hamstring active stretch 3 x 20" Bridge x 10 Bridge with ball hip adduction x 10 Bridge with belt for hip abduction x 10 Abdominal bracing 5" hold x 10 Abdominal bracing with march x 10  Prone:  Glute squeezes 5" hold x 10 Hip extension x 10 each       PATIENT EDUCATION:  PATIENT EDUCATION:  Education details: Patient educated on exam findings, POC, scope of PT 06/22/21 initiated HEP Person educated: Patient Education method: Consulting civil engineer, Demonstration, and Handouts Education comprehension: verbalized understanding, returned demonstration, verbal cues required, and tactile cues required    HOME EXERCISE PROGRAM: Access  Code: [email or web address] URL: [email or web address] Date: 06/22/2021 Prepared by: AP - Rehab  Exercises - Supine Lower Trunk Rotation  - 2 x daily - 7 x weekly - 1 sets - 10 reps - Supine Hamstring Stretch  - 2 x daily - 7 x weekly - 1 sets - 3 reps - 20 sec hold - Supine Bridge  - 2 x daily - 7 x weekly - 1 sets - 10 reps - Supine Bridge with Resistance Band  - 2 x daily - 7 x weekly - 1 sets - 10 reps - Supine Bridge with Mini Swiss Ball Between Knees  - 2 x daily - 7 x weekly - 1 sets - 10 reps - Supine Transversus Abdominis Bracing - Hands on Stomach  - 2 x daily - 7 x weekly - 1 sets - 10 reps - 5 sec hold - Supine March  - 2 x daily - 7 x weekly - 1 sets - 10 reps - Prone Gluteal Sets  - 2 x daily - 7 x weekly - 1 sets - 10 reps - Prone Hip Extension  -  2 x daily - 7 x weekly - 1 sets - 10 reps   ASSESSMENT:  CLINICAL IMPRESSION: Today's session started with review of goals set at evaluation. Patient verbalizes agreement with set rehab goals. Initiated HEP with focus on core and lower extremity strengthening. Patient with noted difficulty with right hip extension more than left.  No reports of increased pain with exercise today.  Discussed pool exercise with patient as she has a pool at her home. Patient will benefit from continued skilled therapy interventions to address deficits and improve functional mobility.    OBJECTIVE IMPAIRMENTS decreased activity tolerance, decreased strength, obesity, and pain.   ACTIVITY LIMITATIONS cleaning, laundry, yard work, and shopping.   PERSONAL FACTORS 3+ comorbidities: anxiety, arthritis, fibromyalgia, spinal stenosis  are also affecting patient's functional outcome.    REHAB POTENTIAL: Good  CLINICAL DECISION MAKING: Stable/uncomplicated  EVALUATION COMPLEXITY: Moderate   GOALS: Goals reviewed with patient? Yes  SHORT TERM GOALS: Target date: 06/08/2021  Patient will be independent with HEP in order to improve functional  outcomes. Baseline: 06/22/21 initiated HEP today Goal status: ongoing  2.  Patient will report at least 25% improvement in symptoms for improved quality of life. Baseline: Goal status: ongoing   PLAN: PT FREQUENCY: 1x/week  PT DURATION: 3 weeks  PLANNED INTERVENTIONS: PLANNED INTERVENTIONS: Therapeutic exercises, Therapeutic activity, Neuromuscular re-education, Balance training, Gait training, Patient/Family education, Joint manipulation, Joint mobilization, Stair training, Orthotic/Fit training, DME instructions, Aquatic Therapy, Dry Needling, Electrical stimulation, Spinal manipulation, Spinal mobilization, Cryotherapy, Moist heat, Compression bandaging, scar mobilization, Splintting, Taping, Traction, Ultrasound, Ionotophoresis '4mg'$ /ml Dexamethasone, and Manual therapy    PLAN FOR NEXT SESSION: reassess;Review HEP and determine if patient wants to continue with formal therapy or discharge to HEP; has a pool at home.   11:14 AM, 06/22/21 Beyonce Sawatzky [facility]et MPT [facility] [place] [phone]

## 2021-06-27 ENCOUNTER — Telehealth (INDEPENDENT_AMBULATORY_CARE_PROVIDER_SITE_OTHER): Payer: Self-pay

## 2021-06-27 ENCOUNTER — Encounter (INDEPENDENT_AMBULATORY_CARE_PROVIDER_SITE_OTHER): Payer: Self-pay

## 2021-06-27 ENCOUNTER — Ambulatory Visit (INDEPENDENT_AMBULATORY_CARE_PROVIDER_SITE_OTHER): Payer: Medicaid Other | Admitting: Gastroenterology

## 2021-06-27 ENCOUNTER — Other Ambulatory Visit (INDEPENDENT_AMBULATORY_CARE_PROVIDER_SITE_OTHER): Payer: Self-pay

## 2021-06-27 ENCOUNTER — Encounter (INDEPENDENT_AMBULATORY_CARE_PROVIDER_SITE_OTHER): Payer: Self-pay | Admitting: Gastroenterology

## 2021-06-27 DIAGNOSIS — K581 Irritable bowel syndrome with constipation: Secondary | ICD-10-CM | POA: Diagnosis not present

## 2021-06-27 DIAGNOSIS — K589 Irritable bowel syndrome without diarrhea: Secondary | ICD-10-CM | POA: Insufficient documentation

## 2021-06-27 DIAGNOSIS — Z8371 Family history of colonic polyps: Secondary | ICD-10-CM

## 2021-06-27 MED ORDER — PEG 3350-KCL-NA BICARB-NACL 420 G PO SOLR: 4000.0000 mL | ORAL | 0 refills | Status: DC

## 2021-06-27 NOTE — Progress Notes (Signed)
Katrinka Blazing, M.D. Gastroenterology & Hepatology [facility] [address] [place], [place] [address] Primary Care Physician: Raliegh Ip, DO [address] [place] [address]  Referring MD: PCP  Chief Complaint: Bloating and constipation  History of Present Illness: Torionna Burciaga is a 46 y.o. female with PMH anxiety, arthritis, FBM, who presents for evaluation of bloating and constipation.  Patient reports that at least for the last year she hs presented recurrent episodes of bloating, abdominal distention, burping and constipation. She is having a bowel movement almost every day but she reports that she has very small feces, very thick/pasty feces or she has presence of  tenesmus frequently. She has also noticed she has flatulence with foul smell sometimes which has been concerning. She only takes stool softeners as needed, she believes this was Dulcolax. She has felt also tenderness diffusely when she pushes her abdomen, this does not get better when she moves her bowels. Has noticed dark stools but no melena. Occasionally has nausea.  The patient denies having any heartburn, vomiting, fever, chills, hematochezia, melena, hematemesis, diarrhea, jaundice, pruritus . Has gained 40 lb since hysterectomy.  Last TSH 2.24 on 01/2021. Last CMP 05/2021 showed mildly decreased calcium 8.6, rest WNL.  Last UJW:JXBJY Last Colonoscopy:7 years ago - had one polyp, reports she is overdue  FHx: neg for any gastrointestinal/liver disease, uncle had bowel cancer but unclear which organ, mother had history of +30 polyps during one colonoscopy Social: neg smoking, alcohol or illicit drug use Surgical: hysterectomy, C-section x3  Past Medical History: Past Medical History:  Diagnosis Date   Anxiety    Arthritis    Endometriosis    now s/p partial hysterectomy 06/2020   Spinal stenosis at L4-L5 level     Past Surgical History: Past  Surgical History:  Procedure Laterality Date   ABDOMINAL HYSTERECTOMY     partial, ovary sparing   CESAREAN SECTION     WISDOM TOOTH EXTRACTION Bilateral     Family History: Family History  Problem Relation Age of Onset   Diabetes Mother    Liver disease Mother    COPD Father    Diabetes Father    Heart disease Maternal Grandmother    Cancer Maternal Grandfather    Cancer Paternal Grandmother    Diabetes Paternal Grandfather     Social History: Social History   Tobacco Use  Smoking Status Former   Packs/day: 0.50   Years: 10.00   Total pack years: 5.00   Types: Cigarettes   Quit date: 06/02/2020   Years since quitting: 1.0   Passive exposure: Past  Smokeless Tobacco Never   Social History   Substance and Sexual Activity  Alcohol Use Not Currently   Social History   Substance and Sexual Activity  Drug Use Not Currently    Allergies: Allergies  Allergen Reactions   Keflex [Cephalexin] Nausea And Vomiting    Medications: Current Outpatient Medications  Medication Sig Dispense Refill   amoxicillin (AMOXIL) 875 MG tablet Take 875 mg by mouth 2 (two) times daily.     betamethasone valerate (VALISONE) 0.1 % cream APPLY TOPICALLY 2 (TWO) TIMES DAILY FOR 10-14 DAYS TO FOOT 30 g 1   ciclopirox (PENLAC) 8 % solution Apply topically daily.     DULoxetine (CYMBALTA) 60 MG capsule Take 60 mg by mouth daily.     esomeprazole (NEXIUM) 40 MG capsule Take 1 capsule by mouth once daily 30 capsule 0   fluticasone (FLONASE)  50 MCG/ACT nasal spray Place 2 sprays into both nostrils daily. 16 g 2   OVER THE COUNTER MEDICATION Vit D once per day.     pregabalin (LYRICA) 75 MG capsule Take 1 capsule (75 mg total) by mouth 2 (two) times daily. 60 capsule 1   tacrolimus (PROTOPIC) 0.1 % ointment Apply topically 2 (two) times daily.     traMADol (ULTRAM) 50 MG tablet Take 1 tablet (50 mg total) by mouth every 12 (twelve) hours as needed for severe pain. Put on file 30 tablet 0   No  current facility-administered medications for this visit.    Review of Systems: GENERAL: negative for malaise, night sweats HEENT: No changes in hearing or vision, no nose bleeds or other nasal problems. NECK: Negative for lumps, goiter, pain and significant neck swelling RESPIRATORY: Negative for cough, wheezing CARDIOVASCULAR: Negative for chest pain, leg swelling, palpitations, orthopnea GI: SEE HPI MUSCULOSKELETAL: Negative for joint pain or swelling, back pain, and muscle pain. SKIN: Negative for lesions, rash PSYCH: Negative for sleep disturbance, mood disorder and recent psychosocial stressors. HEMATOLOGY Negative for prolonged bleeding, bruising easily, and swollen nodes. ENDOCRINE: Negative for cold or heat intolerance, polyuria, polydipsia and goiter. NEURO: negative for tremor, gait imbalance, syncope and seizures. The remainder of the review of systems is noncontributory.   Physical Exam: BP 115/84 (BP Location: Left Arm, Patient Position: Sitting, Cuff Size: Large)   Pulse 86   Temp 97.8 F (36.6 C) (Oral)   Ht 5\' 3"  (1.6 m)   Wt 269 lb 8 oz (122.2 kg)   BMI 47.74 kg/m  GENERAL: The patient is AO x3, in no acute distress.  Obese HEENT: Head is normocephalic and atraumatic. EOMI are intact. Mouth is well hydrated and without lesions. NECK: Supple. No masses LUNGS: Clear to auscultation. No presence of rhonchi/wheezing/rales. Adequate chest expansion HEART: RRR, normal s1 and s2. ABDOMEN: Mildly tender upon palpation diffusely, no guarding, no peritoneal signs, and nondistended. BS +. No masses. EXTREMITIES: Without any cyanosis, clubbing, rash, lesions or edema. NEUROLOGIC: AOx3, no focal motor deficit. SKIN: no jaundice, no rashes   Imaging/Labs: as above  I personally reviewed and interpreted the available labs, imaging and endoscopic files.  Impression and Plan: Aubreyann Zermeno is a 46 y.o. female with PMH anxiety, arthritis, FBM, who presents for evaluation  of bloating and constipation.  The patient has presented chronic recurrent episode of abdominal discomfort, constipation and bloating.  It is very possible that the symptoms are related to IBS-C abnormalities, for which we discussed the implementation of a laxative regimen on a daily basis.  We will start with MiraLAX and uptitrate as needed.  If patient does not improve with this, we will need to proceed with Linzess and check celiac serologies.  Notably, her TSH and CMP have been normal recently. She has a family history of colonic polyposis, for which we will proceed with a repeat colonoscopy for screening.  She is to have this performed every 5 years at least.  - Schedule colonoscopy - Start taking Miralax 1 capful every day for one week. If bowel movements do not improve, increase to 2 capfuls every day. If after two weeks there is no improvement, increase to 3 capfuls  All questions were answered.      Katrinka Blazing, MD Gastroenterology and Hepatology [facility]

## 2021-06-28 DIAGNOSIS — M545 Low back pain, unspecified: Secondary | ICD-10-CM | POA: Insufficient documentation

## 2021-06-29 ENCOUNTER — Encounter (HOSPITAL_COMMUNITY): Payer: Medicaid Other | Admitting: Physical Therapy

## 2021-06-30 ENCOUNTER — Ambulatory Visit: Payer: Medicaid Other | Admitting: Family Medicine

## 2021-06-30 ENCOUNTER — Encounter: Payer: Self-pay | Admitting: Family Medicine

## 2021-06-30 VITALS — BP 97/69 | HR 88 | Temp 97.6°F | Ht 63.0 in | Wt 271.4 lb

## 2021-06-30 DIAGNOSIS — M5441 Lumbago with sciatica, right side: Secondary | ICD-10-CM

## 2021-06-30 MED ORDER — MELOXICAM 15 MG PO TABS: 15.0000 mg | ORAL_TABLET | Freq: Every day | ORAL | 0 refills | Status: DC

## 2021-06-30 MED ORDER — KETOROLAC TROMETHAMINE 60 MG/2ML IM SOLN
60.0000 mg | Freq: Once | INTRAMUSCULAR | Status: AC
  Administered 2021-06-30: 60 mg via INTRAMUSCULAR

## 2021-06-30 MED ORDER — METHYLPREDNISOLONE ACETATE 80 MG/ML IJ SUSP
80.0000 mg | Freq: Once | INTRAMUSCULAR | Status: AC
  Administered 2021-06-30: 80 mg via INTRAMUSCULAR

## 2021-06-30 NOTE — Progress Notes (Signed)
   Acute Office Visit  Subjective:     Patient ID: Selena Howard, female    DOB: 1975-10-30, 46 y.o.   MRN: [id number]  Chief Complaint  Patient presents with   Back Pain    Back Pain   Patient is in today for lower back pain x 1 day. It started yesterday when she was bending over to put her granddaughter in her car seat. She felt a sharp shooting pain. She then she continues to have sharp shooting pain with movement, twisting, or bending. The pain is an 8/10. It improves with rest. The pain radiates around her hip and sometimes up her back. She has a history of chronic lower back pain but this is different that her typical pain. She takes tramadol and lyrica. This has not helped. She has not taken any NSAIDs. Denis numbness, tingling, changes in bowel or bladder control, or saddle anesthesida.   Review of Systems  Musculoskeletal:  Positive for back pain.   As per HPI.      Objective:    BP 97/69   Pulse 88   Temp 97.6 F (36.4 C) (Temporal)   Ht '5\' 3"'$  (1.6 m)   Wt 271 lb 6 oz (123.1 kg)   SpO2 98%   BMI 48.07 kg/m    Physical Exam Vitals and nursing note reviewed.  Constitutional:      General: She is not in acute distress.    Appearance: She is not ill-appearing, toxic-appearing or diaphoretic.  Pulmonary:     Effort: Pulmonary effort is normal. No respiratory distress.  Musculoskeletal:     Lumbar back: Tenderness (parapspinal) present. No swelling or bony tenderness. Positive right straight leg raise test.     Right lower leg: No edema.     Left lower leg: No edema.  Skin:    General: Skin is warm and dry.  Neurological:     General: No focal deficit present.     Mental Status: She is alert and oriented to person, place, and time.  Psychiatric:        Mood and Affect: Mood normal.        Behavior: Behavior normal.     No results found for any visits on 06/30/21.      Assessment & Plan:   Selena Howard was seen today for back pain.  Diagnoses and all  orders for this visit:  Acute right-sided low back pain with right-sided sciatica Toradol and steroid IM injection today in the office. Start mobic tomorrow prn. Do not take other NSAIDs with mobic.  -     meloxicam (MOBIC) 15 MG tablet; Take 1 tablet (15 mg total) by mouth daily. -     methylPREDNISolone acetate (DEPO-MEDROL) injection 80 mg -     ketorolac (TORADOL) injection 60 mg   Return if symptoms worsen or fail to improve.  The patient indicates understanding of these issues and agrees with the plan.  Gwenlyn Perking, FNP

## 2021-06-30 NOTE — Patient Instructions (Signed)
Radicular Pain Radicular pain is a type of pain that spreads from your back or neck along a spinal nerve. Spinal nerves are nerves that leave the spinal cord and go to the muscles. Radicular pain is sometimes called radiculopathy, radiculitis, or a pinched nerve. When you have this type of pain, you may also have weakness, numbness, or tingling in the area of your body that is supplied by the nerve. The pain may feel sharp and burning. Depending on which spinal nerve is affected, the pain may occur in the: Neck area (cervical radicular pain). You may also feel pain, numbness, weakness, or tingling in the arms. Mid-spine area (thoracic radicular pain). You would feel this pain in the back and chest. This type is rare. Lower back area (lumbar radicular pain). You would feel this pain as low back pain. You may feel pain, numbness, weakness, or tingling in the buttocks or legs. Sciatica is a type of lumbar radicular pain that shoots down the back of the leg. Radicular pain occurs when one of the spinal nerves becomes irritated or squeezed (compressed). It is often caused by something pushing on a spinal nerve, such as one of the bones of the spine (vertebrae) or one of the round cushions between vertebrae (intervertebral disks). This can result from: An injury. Wear and tear or aging of a disk. The growth of a bone spur that pushes on the nerve. Radicular pain often goes away when you follow instructions from your health care provider for relieving pain at home. How is this treated? Treatment may depend on the cause of the condition and may include: Working with a physical therapist. Taking pain medicine. Applying heat or ice or both to the affected areas. Doing stretches to improve flexibility. Having surgery. This may be needed if other treatments do not help. Different types of surgery may be done depending on the cause of this condition. Follow these instructions at home: Managing pain     If  directed, put ice on the affected area. To do this: Put ice in a plastic bag. Place a towel between your skin and the bag. Leave the ice on for 20 minutes, 2-3 times a day. Remove the ice if your skin turns bright red. This is very important. If you cannot feel pain, heat, or cold, you have a greater risk of damage to the area. If directed, apply heat to the affected area as often as told by your health care provider. Use the heat source that your health care provider recommends, such as a moist heat pack or a heating pad. Place a towel between your skin and the heat source. Leave the heat on for 20-30 minutes. Remove the heat if your skin turns bright red. This is especially important if you are unable to feel pain, heat, or cold. You have a greater risk of getting burned. Activity Do not sit or rest in bed for long periods of time. Try to stay as active as possible. Ask your health care provider what type of exercise or activity is best for you. Avoid activities that make your pain worse, such as bending and lifting. You may have to avoid lifting. Ask your health care provider how much you can safely lift. Practice using proper technique when lifting items. Proper lifting technique involves bending your knees and rising up. Do strength and range-of-motion exercises only as told by your health care provider or physical therapist. General instructions Take over-the-counter and prescription medicines only as told by your   health care provider. Pay attention to any changes in your symptoms. Keep all follow-up visits. This is important. Contact a health care provider if: Your pain and other symptoms get worse. Your pain medicine is not helping. Your pain has not improved after a few weeks of home care. You have a fever. Get help right away if: You have severe pain, weakness, or numbness. You have difficulty with bladder or bowel control. Summary Radicular pain is a type of pain that spreads  from your back or neck along a spinal nerve. When you have radicular pain, you may also have weakness, numbness, or tingling in the area of your body that is supplied by the nerve. The pain may feel sharp or burning. Radicular pain may be treated with ice, heat, medicines, or physical therapy. This information is not intended to replace advice given to you by your health care provider. Make sure you discuss any questions you have with your health care provider. Document Revised: 06/24/2020 Document Reviewed: 06/24/2020 Elsevier Patient Education  2023 [employer]  

## 2021-07-15 ENCOUNTER — Other Ambulatory Visit: Payer: Self-pay | Admitting: Family Medicine

## 2021-07-15 DIAGNOSIS — K219 Gastro-esophageal reflux disease without esophagitis: Secondary | ICD-10-CM

## 2021-07-22 ENCOUNTER — Encounter: Payer: Self-pay | Admitting: Obstetrics & Gynecology

## 2021-07-22 ENCOUNTER — Ambulatory Visit (INDEPENDENT_AMBULATORY_CARE_PROVIDER_SITE_OTHER): Payer: Medicaid Other | Admitting: Obstetrics & Gynecology

## 2021-07-22 VITALS — BP 125/90 | HR 77 | Ht 63.0 in | Wt 273.2 lb

## 2021-07-22 DIAGNOSIS — Z0000 Encounter for general adult medical examination without abnormal findings: Secondary | ICD-10-CM

## 2021-07-22 DIAGNOSIS — Z01419 Encounter for gynecological examination (general) (routine) without abnormal findings: Secondary | ICD-10-CM

## 2021-07-22 DIAGNOSIS — Z6841 Body Mass Index (BMI) 40.0 and over, adult: Secondary | ICD-10-CM

## 2021-07-22 NOTE — Progress Notes (Signed)
WELL-WOMAN EXAMINATION Patient name: Selena Howard MRN [id number]  Date of birth: Dec 16, 1975 Chief Complaint:   Gynecologic Exam  History of Present Illness:   Selena Howard is a 46 y.o. G72P3003 PH female being seen today for a routine well-woman exam.  Today she notes: weight gain since hysterectomy.  Surgery completed due to endometriosis and fibroids- completed laparoscopically.    Pt reports ovarian preservation.  Denies hot flashes/night sweats.   No LMP recorded. Patient has had a hysterectomy.   The current method of family planning is  hysterectomy .    Last pap n/a.  Last mammogram: completed-06/2021 Last colonoscopy: scheduled     06/30/2021    8:15 AM 06/21/2021    2:08 PM 05/20/2021    1:08 PM 01/19/2021    2:28 PM 12/01/2020   11:19 AM  Depression screen PHQ 2/9  Decreased Interest 1 0 0 1 0  Down, Depressed, Hopeless 0 0 0 0 0  PHQ - 2 Score 1 0 0 1 0  Altered sleeping '2 3 3 3 3  '$ Tired, decreased energy '1 1 2 3 2  '$ Change in appetite 0 1 0 1 2  Feeling bad or failure about yourself  0 0 0 0 0  Trouble concentrating '1 1 1 '$ 0 0  Moving slowly or fidgety/restless 1 1 0 0 0  Suicidal thoughts 0 0 0 0 0  PHQ-9 Score '6 7 6 8 7  '$ Difficult doing work/chores Somewhat difficult Somewhat difficult Somewhat difficult Not difficult at all Somewhat difficult      Review of Systems:   Pertinent items are noted in HPI Denies any headaches, blurred vision, fatigue, shortness of breath, chest pain, abdominal pain, bowel movements, urination, or intercourse unless otherwise stated above.  Pertinent History Reviewed:  Reviewed past medical,surgical, social and family history.  Reviewed problem list, medications and allergies. Physical Assessment:   Vitals:   07/22/21 1020  BP: 125/90  Pulse: 77  Weight: 273 lb 3.2 oz (123.9 kg)  Height: '5\' 3"'$  (1.6 m)  Body mass index is 48.4 kg/m.        Physical Examination:   General appearance - well appearing, and in no  distress  Mental status - alert, oriented to person, place, and time  Psych:  She has a normal mood and affect  Skin - warm and dry, normal color, no suspicious lesions noted  Chest - effort normal, all lung fields clear to auscultation bilaterally  Heart - normal rate and regular rhythm  Neck:  midline trachea, no thyromegaly or nodules  Breasts - breasts appear normal, no suspicious masses, no skin or nipple changes or  axillary nodes  Abdomen - soft, nontender, nondistended, no masses or organomegaly  Pelvic - VULVA: normal appearing vulva with no masses, tenderness or lesions  VAGINA: normal appearing vagina with normal color and discharge, no lesions  Uterus and cervix surgically absent  ADNEXA: No adnexal masses or tenderness noted.- exam limited due to body habitus  Extremities:  No swelling or varicosities noted  Chaperone:  pt declined      Assessment & Plan:  1) Well-Woman Exam -pap not indicated due to prior hysterectomy -mammogram up to date -colonoscopy scheduled  2) Weight management -reviewed diet- currently skips breakfast, typically cereal or PB&J for lunch then "usual dinner -sometimes meatloaf, squash -discussed apps, nutritionist or other program to help change diet choices.  Reviewed importance of protein and veggies -briefly discussed perimenopausal status and how metabolism can change; however firs  step is lifestyle choices to support balanced diet -Discussed HRT- not indicated at this time due to mild symptoms -Questions and concerns were addressed   Meds: No orders of the defined types were placed in this encounter.   Follow-up: Return in about 1 year (around 07/23/2022) for Annual.   Janyth Pupa, DO Attending [place], [place] for [facility], [place]

## 2021-07-25 ENCOUNTER — Encounter: Payer: Self-pay | Admitting: Family Medicine

## 2021-07-25 ENCOUNTER — Ambulatory Visit: Payer: Medicaid Other | Admitting: Family Medicine

## 2021-07-25 DIAGNOSIS — G894 Chronic pain syndrome: Secondary | ICD-10-CM | POA: Diagnosis not present

## 2021-07-25 DIAGNOSIS — M797 Fibromyalgia: Secondary | ICD-10-CM | POA: Diagnosis not present

## 2021-07-25 NOTE — Progress Notes (Signed)
Subjective: CC: Morbid obesity PCP: Selena Norlander, DO Selena Howard is a 46 y.o. female presenting to clinic today for:  1.  Morbid obesity Patient here for interval follow-up.  She has been treated with prednisone a couple of times since her last visit.  She continues to have difficulty losing weight.  She continues to struggle with edema.  She is now under the care of pain management and they recently prescribed Norco.  She has not yet picked this up and is very concerned about utilizing it due to family history of dependency issues.  She does not want to put herself at risk for dependency.  She has been utilizing the Lyrica.  No changes have been made by pain management and she is not sure if they intend to take over that medication or not.  She has an appointment to follow-up with that clinic in the next couple of days.  She apparently has been referred to the weight clinic there as well but she has not yet met with anyone.   ROS: Per HPI  Allergies  Allergen Reactions   Keflex [Cephalexin] Nausea And Vomiting   Past Medical History:  Diagnosis Date   Anxiety    Arthritis    Endometriosis    now s/p partial hysterectomy 06/2020   Spinal stenosis at L4-L5 level     Current Outpatient Medications:    DULoxetine (CYMBALTA) 60 MG capsule, Take 60 mg by mouth daily., Disp: , Rfl:    esomeprazole (NEXIUM) 40 MG capsule, Take 1 capsule by mouth once daily, Disp: 90 capsule, Rfl: 0   fluticasone (FLONASE) 50 MCG/ACT nasal spray, Place 2 sprays into both nostrils daily., Disp: 16 g, Rfl: 2   meloxicam (MOBIC) 15 MG tablet, Take 1 tablet (15 mg total) by mouth daily., Disp: 30 tablet, Rfl: 0   OVER THE COUNTER MEDICATION, Vit D once per day., Disp: , Rfl:    penicillin v potassium (VEETID) 500 MG tablet, Take 500 mg by mouth 4 (four) times daily., Disp: , Rfl:    pregabalin (LYRICA) 75 MG capsule, Take 1 capsule (75 mg total) by mouth 2 (two) times daily., Disp: 60 capsule,  Rfl: 1   traMADol (ULTRAM) 50 MG tablet, Take 1 tablet (50 mg total) by mouth every 12 (twelve) hours as needed for severe pain. Put on file, Disp: 30 tablet, Rfl: 0 Social History   Socioeconomic History   Marital status: Divorced    Spouse name: Not on file   Number of children: 3   Years of education: 12   Highest education level: High school graduate  Occupational History   Not on file  Tobacco Use   Smoking status: Former    Packs/day: 0.50    Years: 10.00    Total pack years: 5.00    Types: Cigarettes    Quit date: 06/02/2020    Years since quitting: 1.1    Passive exposure: Past   Smokeless tobacco: Never  Vaping Use   Vaping Use: Never used  Substance and Sexual Activity   Alcohol use: Not Currently   Drug use: Not Currently   Sexual activity: Not Currently    Birth control/protection: Surgical  Other Topics Concern   Not on file  Social History Narrative   Right handed   Caffeine use: no tea, Coffee-tries to drink decaf, diet sodas   Mother also sees me, [name]   Social Determinants of Health   Financial Resource Strain: Not on file  Food Insecurity: Not on file  Transportation Needs: Not on file  Physical Activity: Not on file  Stress: Not on file  Social Connections: Not on file  Intimate Partner Violence: Not on file   Family History  Problem Relation Age of Onset   Diabetes Mother    Liver disease Mother    COPD Father    Diabetes Father    Heart disease Maternal Grandmother    Cancer Maternal Grandfather    Cancer Paternal Grandmother    Diabetes Paternal Grandfather     Objective: Office vital signs reviewed. BP 136/86   Pulse 82   Temp 97.9 F (36.6 C)   Ht 5' 3"  (1.6 m)   Wt 276 lb (125.2 kg)   SpO2 96%   BMI 48.89 kg/m   Physical Examination:  General: Awake, alert, morbidly obese, No acute distress HEENT: Sclera white Cardio: regular rate and rhythm  Pulm: Normal work of breathing on room air MSK: Ambulating  independently  Assessment/ Plan: 46 y.o. female   Morbid obesity ([place])  Chronic pain syndrome  Fibromyalgia  Unfortunately continues to gain weight.  She is expected to see a weight loss clinic at [facility] in the next couple of days.  She is very interested in pursuing the semaglutide combined with B12 through the compounding pharmacy in [place] but we will see what [name] can offer through her insurance first.  She will contact me regardless of what the plan will be going forward.  She is currently under pain contract with pain management now.  They are aware that she is getting Lyrica here and have no medication changes except for the Norco that was added.  She has not yet started that but understands it we will replace her tramadol.  Discussed that they would likely take over her Lyrica prescription but if not, she will contact me and I will be glad to renew since there are no dose adjustments at this time   No orders of the defined types were placed in this encounter.  No orders of the defined types were placed in this encounter.    Selena Norlander, DO [place] [phone]

## 2021-08-03 NOTE — Progress Notes (Deleted)
Office Visit Note  Patient: Selena Howard             Date of Birth: December 13, 1975           MRN: [id number]             PCP: [name], DO Referring: [name], DO Visit Date: 08/16/2021 Occupation: '@GUAROCC'$ @  Subjective:  No chief complaint on file.   History of Present Illness: Selena Howard is a 46 y.o. female ***   Activities of Daily Living:  Patient reports morning stiffness for *** {minute/hour:19697}.   Patient {ACTIONS;DENIES/REPORTS:21021675::"Denies"} nocturnal pain.  Difficulty dressing/grooming: {ACTIONS;DENIES/REPORTS:21021675::"Denies"} Difficulty climbing stairs: {ACTIONS;DENIES/REPORTS:21021675::"Denies"} Difficulty getting out of chair: {ACTIONS;DENIES/REPORTS:21021675::"Denies"} Difficulty using hands for taps, buttons, cutlery, and/or writing: {ACTIONS;DENIES/REPORTS:21021675::"Denies"}  No Rheumatology ROS completed.   PMFS History:  Patient Active Problem List   Diagnosis Date Noted  . IBS (irritable bowel syndrome) 06/27/2021  . Obesity, morbid ([place]) 06/27/2021  . Family history of colonic polyps 06/27/2021  . Elevated sedimentation rate 05/17/2021  . RLS (restless legs syndrome) 03/31/2021  . 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  . Vivid dream 03/31/2021  . Snoring 03/31/2021  . Chronic fatigue 03/31/2021  . Excessive daytime sleepiness 03/31/2021  . GERD with apnea 03/31/2021  . Sleep related headaches 03/31/2021  . Body aches 03/30/2021  . Paresthesia 02/04/2021  . Obstructive sleep apnea 02/04/2021  . Vitamin D deficiency 01/28/2021  . History of anxiety 01/28/2021  . Bilateral hand numbness 12/16/2020  . Other intervertebral disc degeneration, lumbar region 12/16/2020    Past Medical History:  Diagnosis Date  . Anxiety   . Arthritis   . Endometriosis    now s/p partial hysterectomy 06/2020  . Spinal stenosis at L4-L5 level     Family History  Problem Relation Age of Onset  . Diabetes Mother   . Liver  disease Mother   . COPD Father   . Diabetes Father   . Heart disease Maternal Grandmother   . Cancer Maternal Grandfather   . Cancer Paternal Grandmother   . Diabetes Paternal Grandfather    Past Surgical History:  Procedure Laterality Date  . ABDOMINAL HYSTERECTOMY     partial, ovary sparing  . CESAREAN SECTION    . WISDOM TOOTH EXTRACTION Bilateral    Social History   Social History Narrative   Right handed   Caffeine use: no tea, Coffee-tries to drink decaf, diet sodas   Mother also sees me, Butch Penny Robinette    There is no immunization history on file for this patient.   Objective: Vital Signs: There were no vitals taken for this visit.   Physical Exam   Musculoskeletal Exam: ***  CDAI Exam: CDAI Score: -- Patient Global: --; Provider Global: -- Swollen: --; Tender: -- Joint Exam 08/16/2021   No joint exam has been documented for this visit   There is currently no information documented on the homunculus. Go to the Rheumatology activity and complete the homunculus joint exam.  Investigation: No additional findings.  Imaging: No results found.  Recent Labs: Lab Results  Component Value Date   WBC 5.3 05/17/2021   HGB 12.3 05/17/2021   PLT 259 05/17/2021   NA 138 05/17/2021   K 4.3 05/17/2021   CL 106 05/17/2021   CO2 30 05/17/2021   GLUCOSE 93 05/17/2021   BUN 14 05/17/2021   CREATININE 0.77 05/17/2021   BILITOT 0.6 05/17/2021   ALKPHOS 68 05/17/2021   AST 16 05/17/2021   ALT 17  05/17/2021   PROT 6.6 05/17/2021   ALBUMIN 3.4 (L) 05/17/2021   CALCIUM 8.6 (L) 05/17/2021    Speciality Comments: Discussed viscosupplement injections at the next visit if she has an adequate response to cortisone injection, exercise, physical therapy and NSAID use.  Procedures:  No procedures performed Allergies: Keflex [cephalexin]   Assessment / Plan:     Visit Diagnoses: No diagnosis found.  Orders: No orders of the defined types were placed in this  encounter.  No orders of the defined types were placed in this encounter.   Face-to-face time spent with patient was *** minutes. Greater than 50% of time was spent in counseling and coordination of care.  Follow-Up Instructions: No follow-ups on file.   Earnestine Mealing, CMA  Note - This record has been created using Editor, commissioning.  Chart creation errors have been sought, but may not always  have been located. Such creation errors do not reflect on  the standard of medical care.

## 2021-08-11 ENCOUNTER — Telehealth: Payer: Self-pay | Admitting: Rheumatology

## 2021-08-11 NOTE — Telephone Encounter (Signed)
Patient called the office stating she missed a call and requests a call back.

## 2021-08-16 ENCOUNTER — Ambulatory Visit: Payer: Medicaid Other | Admitting: Physician Assistant

## 2021-08-16 DIAGNOSIS — Z8719 Personal history of other diseases of the digestive system: Secondary | ICD-10-CM

## 2021-08-16 DIAGNOSIS — Z8269 Family history of other diseases of the musculoskeletal system and connective tissue: Secondary | ICD-10-CM

## 2021-08-16 DIAGNOSIS — M797 Fibromyalgia: Secondary | ICD-10-CM

## 2021-08-16 DIAGNOSIS — M5136 Other intervertebral disc degeneration, lumbar region: Secondary | ICD-10-CM

## 2021-08-16 DIAGNOSIS — M19071 Primary osteoarthritis, right ankle and foot: Secondary | ICD-10-CM

## 2021-08-16 DIAGNOSIS — R200 Anesthesia of skin: Secondary | ICD-10-CM

## 2021-08-16 DIAGNOSIS — E559 Vitamin D deficiency, unspecified: Secondary | ICD-10-CM

## 2021-08-16 DIAGNOSIS — R700 Elevated erythrocyte sedimentation rate: Secondary | ICD-10-CM

## 2021-08-16 DIAGNOSIS — G4709 Other insomnia: Secondary | ICD-10-CM

## 2021-08-16 DIAGNOSIS — R5383 Other fatigue: Secondary | ICD-10-CM

## 2021-08-16 DIAGNOSIS — Z87891 Personal history of nicotine dependence: Secondary | ICD-10-CM

## 2021-08-16 DIAGNOSIS — M19041 Primary osteoarthritis, right hand: Secondary | ICD-10-CM

## 2021-08-16 DIAGNOSIS — M170 Bilateral primary osteoarthritis of knee: Secondary | ICD-10-CM

## 2021-08-16 DIAGNOSIS — B351 Tinea unguium: Secondary | ICD-10-CM

## 2021-08-16 DIAGNOSIS — Z8659 Personal history of other mental and behavioral disorders: Secondary | ICD-10-CM

## 2021-08-17 ENCOUNTER — Ambulatory Visit (HOSPITAL_COMMUNITY)
Admission: RE | Admit: 2021-08-17 | Discharge: 2021-08-17 | Disposition: A | Discharge status: Home / Self Care | Payer: Medicaid Other | Attending: Gastroenterology | Admitting: Gastroenterology

## 2021-08-17 ENCOUNTER — Encounter (HOSPITAL_COMMUNITY): Payer: Self-pay | Admitting: Gastroenterology

## 2021-08-17 ENCOUNTER — Encounter (HOSPITAL_COMMUNITY)
Admission: RE | Disposition: A | Discharge status: Home / Self Care | Payer: Self-pay | Source: Home / Self Care | Attending: Gastroenterology

## 2021-08-17 ENCOUNTER — Ambulatory Visit (HOSPITAL_BASED_OUTPATIENT_CLINIC_OR_DEPARTMENT_OTHER): Payer: Medicaid Other | Admitting: Anesthesiology

## 2021-08-17 ENCOUNTER — Other Ambulatory Visit: Payer: Self-pay

## 2021-08-17 ENCOUNTER — Ambulatory Visit (HOSPITAL_COMMUNITY): Payer: Medicaid Other | Admitting: Anesthesiology

## 2021-08-17 DIAGNOSIS — D122 Benign neoplasm of ascending colon: Secondary | ICD-10-CM | POA: Diagnosis not present

## 2021-08-17 DIAGNOSIS — D123 Benign neoplasm of transverse colon: Secondary | ICD-10-CM | POA: Diagnosis not present

## 2021-08-17 DIAGNOSIS — Z86010 Personal history of colonic polyps: Secondary | ICD-10-CM | POA: Insufficient documentation

## 2021-08-17 DIAGNOSIS — K219 Gastro-esophageal reflux disease without esophagitis: Secondary | ICD-10-CM | POA: Diagnosis not present

## 2021-08-17 DIAGNOSIS — D124 Benign neoplasm of descending colon: Secondary | ICD-10-CM | POA: Insufficient documentation

## 2021-08-17 DIAGNOSIS — Z1211 Encounter for screening for malignant neoplasm of colon: Secondary | ICD-10-CM | POA: Diagnosis not present

## 2021-08-17 DIAGNOSIS — K635 Polyp of colon: Secondary | ICD-10-CM

## 2021-08-17 DIAGNOSIS — G4730 Sleep apnea, unspecified: Secondary | ICD-10-CM

## 2021-08-17 DIAGNOSIS — Z8371 Family history of colonic polyps: Secondary | ICD-10-CM | POA: Insufficient documentation

## 2021-08-17 DIAGNOSIS — M1990 Unspecified osteoarthritis, unspecified site: Secondary | ICD-10-CM | POA: Diagnosis not present

## 2021-08-17 DIAGNOSIS — Z87891 Personal history of nicotine dependence: Secondary | ICD-10-CM | POA: Insufficient documentation

## 2021-08-17 DIAGNOSIS — Z09 Encounter for follow-up examination after completed treatment for conditions other than malignant neoplasm: Secondary | ICD-10-CM | POA: Diagnosis not present

## 2021-08-17 DIAGNOSIS — F419 Anxiety disorder, unspecified: Secondary | ICD-10-CM

## 2021-08-17 DIAGNOSIS — Z6841 Body Mass Index (BMI) 40.0 and over, adult: Secondary | ICD-10-CM | POA: Insufficient documentation

## 2021-08-17 HISTORY — PX: SUBMUCOSAL LIFTING INJECTION: SHX6855

## 2021-08-17 HISTORY — PX: HEMOSTASIS CLIP PLACEMENT: SHX6857

## 2021-08-17 HISTORY — PX: COLONOSCOPY WITH PROPOFOL: SHX5780

## 2021-08-17 HISTORY — DX: Gastro-esophageal reflux disease without esophagitis: K21.9

## 2021-08-17 HISTORY — PX: POLYPECTOMY: SHX5525

## 2021-08-17 HISTORY — PX: SUBMUCOSAL TATTOO INJECTION: SHX6856

## 2021-08-17 LAB — HM COLONOSCOPY

## 2021-08-17 SURGERY — COLONOSCOPY WITH PROPOFOL
Anesthesia: General

## 2021-08-17 MED ORDER — PROPOFOL 500 MG/50ML IV EMUL
INTRAVENOUS | Status: AC
  Filled 2021-08-17: qty 50

## 2021-08-17 MED ORDER — PROPOFOL 10 MG/ML IV BOLUS
INTRAVENOUS | Status: DC | PRN
  Administered 2021-08-17: 60 mg via INTRAVENOUS

## 2021-08-17 MED ORDER — LACTATED RINGERS IV SOLN: INTRAVENOUS | Status: DC

## 2021-08-17 MED ORDER — PROPOFOL 500 MG/50ML IV EMUL
INTRAVENOUS | Status: DC | PRN
  Administered 2021-08-17: 150 ug/kg/min via INTRAVENOUS

## 2021-08-17 MED ORDER — SPOT INK MARKER SYRINGE KIT
PACK | SUBMUCOSAL | Status: DC | PRN
  Administered 2021-08-17: 1 mL via SUBMUCOSAL

## 2021-08-17 MED ORDER — PROPOFOL 1000 MG/100ML IV EMUL
INTRAVENOUS | Status: AC
  Filled 2021-08-17: qty 100

## 2021-08-17 MED ORDER — SPOT INK MARKER SYRINGE KIT
PACK | SUBMUCOSAL | Status: AC
  Filled 2021-08-17: qty 5

## 2021-08-17 NOTE — Discharge Instructions (Signed)
You are being discharged to home.  Resume your previous diet.  We are waiting for your pathology results.  Your physician has recommended a repeat colonoscopy in six months for surveillance after today's piecemeal polypectomy.  

## 2021-08-17 NOTE — H&P (Signed)
Selena Howard is an 46 y.o. female.   Chief Complaint: screening colonoscopy and family history of colonic polyposis HPI: Selena Howard is a 46 y.o. female with PMH anxiety, arthritis, FBM, who presents for screening colonoscopy and family history of colonic polyposis.  Last Colonoscopy:7 years ago - had one polyp, reports she is overdue .The patient denies having any complaints such as melena, hematochezia, abdominal pain or distention, change in her bowel movement consistency or frequency, no changes in weight recently.  No family history of colorectal cancer.   Past Medical History:  Diagnosis Date   Anxiety    Arthritis    Endometriosis    now s/p partial hysterectomy 06/2020   GERD (gastroesophageal reflux disease)    Spinal stenosis at L4-L5 level     Past Surgical History:  Procedure Laterality Date   ABDOMINAL HYSTERECTOMY     partial, ovary sparing   CESAREAN SECTION     WISDOM TOOTH EXTRACTION Bilateral     Family History  Problem Relation Age of Onset   Diabetes Mother    Liver disease Mother    COPD Father    Diabetes Father    Heart disease Maternal Grandmother    Cancer Maternal Grandfather    Cancer Paternal Grandmother    Diabetes Paternal Grandfather    Social History:  reports that she quit smoking about 14 months ago. Her smoking use included cigarettes. She has a 5.00 pack-year smoking history. She has been exposed to tobacco smoke. She has never used smokeless tobacco. She reports that she does not currently use alcohol. She reports that she does not currently use drugs.  Allergies:  Allergies  Allergen Reactions   Keflex [Cephalexin] Nausea And Vomiting    Medications Prior to Admission  Medication Sig Dispense Refill   betamethasone valerate (VALISONE) 0.1 % cream Apply 1 Application topically daily as needed (foor rash).     DULoxetine (CYMBALTA) 60 MG capsule Take 60 mg by mouth daily.     esomeprazole (NEXIUM) 40 MG capsule Take 1 capsule by  mouth once daily 90 capsule 0   fluticasone (FLONASE) 50 MCG/ACT nasal spray Place 2 sprays into both nostrils daily. 16 g 2   furosemide (LASIX) 40 MG tablet Take 40 mg by mouth daily.     HYDROcodone-acetaminophen (NORCO) 7.5-325 MG tablet Take 1 tablet by mouth every 6 (six) hours as needed for moderate pain.     meloxicam (MOBIC) 15 MG tablet Take 1 tablet (15 mg total) by mouth daily. 30 tablet 0   pregabalin (LYRICA) 75 MG capsule Take 1 capsule (75 mg total) by mouth 2 (two) times daily. 60 capsule 1   HYDROcodone-acetaminophen (NORCO/VICODIN) 5-325 MG tablet Take 0.5 tablets by mouth daily as needed for moderate pain or severe pain.      No results found for this or any previous visit (from the past 48 hour(s)). No results found.  Review of Systems  All other systems reviewed and are negative.   Blood pressure 123/65, pulse 75, temperature 97.8 F (36.6 C), temperature source Oral, resp. rate 14, height '5\' 3"'$  (1.6 m), weight 122.5 kg, SpO2 99 %. Physical Exam  GENERAL: The patient is AO x3, in no acute distress. HEENT: Head is normocephalic and atraumatic. EOMI are intact. Mouth is well hydrated and without lesions. NECK: Supple. No masses LUNGS: Clear to auscultation. No presence of rhonchi/wheezing/rales. Adequate chest expansion HEART: RRR, normal s1 and s2. ABDOMEN: Soft, nontender, no guarding, no peritoneal signs, and nondistended. BS +.  No masses. EXTREMITIES: Without any cyanosis, clubbing, rash, lesions or edema. NEUROLOGIC: AOx3, no focal motor deficit. SKIN: no jaundice, no rashes  Assessment/Plan Selena Howard is a 46 y.o. female with PMH anxiety, arthritis, FBM, who presents for screening colonoscopy and family history of colonic polyposis.  We will proceed with colonoscopy.  Harvel Quale, MD 08/17/2021, 8:06 AM

## 2021-08-17 NOTE — Anesthesia Postprocedure Evaluation (Signed)
Anesthesia Post Note  Patient: Selena Howard  Procedure(s) Performed: COLONOSCOPY WITH PROPOFOL POLYPECTOMY SUBMUCOSAL LIFTING INJECTION HEMOSTASIS CLIP PLACEMENT SUBMUCOSAL TATTOO INJECTION  Patient location during evaluation: Phase II Anesthesia Type: General Level of consciousness: awake and alert and oriented Pain management: pain level controlled Vital Signs Assessment: post-procedure vital signs reviewed and stable Respiratory status: spontaneous breathing, nonlabored ventilation and respiratory function stable Cardiovascular status: blood pressure returned to baseline and stable Postop Assessment: no apparent nausea or vomiting Anesthetic complications: no   No notable events documented.   Last Vitals:  Vitals:   08/17/21 0759 08/17/21 1006  BP: 123/65 122/69  Pulse: 75 77  Resp: 14 20  Temp: 36.6 C 36.6 C  SpO2: 99% 99%    Last Pain:  Vitals:   08/17/21 1010  TempSrc:   PainSc: 0-No pain                 Selena Howard

## 2021-08-17 NOTE — Progress Notes (Unsigned)
Office Visit Note  Patient: Selena Howard             Date of Birth: 10/14/1975           MRN: [id number]             PCP: [name], DO Referring: [name], DO Visit Date: 08/23/2021 Occupation: @GUAROCC @  Subjective:  Pain in both hands   History of Present Illness: Selena Howard is a 46 y.o. female with history of osteoarthritis, DDD, and fibromyalgia.  She remains on Cymbalta 60 mg daily  and Lyrica 75 mg twice daily.  She is followed by pain management.  She takes hydrocodone as needed for pain relief.  She continues to have chronic pain involving multiple joints.  She states that the left knee joint cortisone injection at her last office visit provided only temporary relief.  She is been experiencing increased pain and intermittent swelling in both hands.  She states that her hands lock up at times.  She has also had increased pain in both feet consistent with planter fasciitis.  She has generalized myalgias and muscle tenderness due to fibromyalgia.  She continues to have recurrent flares at which time she experiences profound fatigue.   Activities of Daily Living:  Patient reports morning stiffness for 1 hour.   Patient Reports nocturnal pain.  Difficulty dressing/grooming: Reports Difficulty climbing stairs: Reports Difficulty getting out of chair: Reports Difficulty using hands for taps, buttons, cutlery, and/or writing: Reports  Review of Systems  Constitutional:  Positive for fatigue.  HENT:  Negative for mouth sores, mouth dryness and nose dryness.   Eyes:  Negative for pain, visual disturbance and dryness.  Respiratory:  Negative for cough, hemoptysis, shortness of breath and difficulty breathing.   Cardiovascular:  Positive for palpitations. Negative for chest pain, hypertension and swelling in legs/feet.  Gastrointestinal:  Negative for blood in stool, constipation and diarrhea.  Endocrine: Negative for increased urination.  Genitourinary:   Positive for involuntary urination. Negative for difficulty urinating and painful urination.  Musculoskeletal:  Positive for joint pain, joint pain, joint swelling, myalgias, muscle weakness, morning stiffness, muscle tenderness and myalgias. Negative for gait problem.  Skin:  Positive for rash, hair loss and sensitivity to sunlight. Negative for color change, pallor, nodules/bumps, skin tightness and ulcers.  Allergic/Immunologic: Negative for susceptible to infections.  Neurological:  Positive for headaches. Negative for dizziness, numbness and weakness.  Hematological:  Negative for swollen glands.  Psychiatric/Behavioral:  Negative for depressed mood and sleep disturbance. The patient is nervous/anxious.     PMFS History:  Patient Active Problem List   Diagnosis Date Noted   IBS (irritable bowel syndrome) 06/27/2021   Obesity, morbid ([place]) 06/27/2021   Family history of colonic polyps 06/27/2021   Elevated sedimentation rate 05/17/2021   RLS (restless legs syndrome) 03/31/2021   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   Vivid dream 03/31/2021   Snoring 03/31/2021   Chronic fatigue 03/31/2021   Excessive daytime sleepiness 03/31/2021   GERD with apnea 03/31/2021   Sleep related headaches 03/31/2021   Body aches 03/30/2021   Paresthesia 02/04/2021   Obstructive sleep apnea 02/04/2021   Vitamin D deficiency 01/28/2021   History of anxiety 01/28/2021   Bilateral hand numbness 12/16/2020   Other intervertebral disc degeneration, lumbar region 12/16/2020    Past Medical History:  Diagnosis Date   Anxiety    Arthritis    Endometriosis    now s/p partial hysterectomy 06/2020   GERD (  gastroesophageal reflux disease)    Osteoarthritis    Spinal stenosis at L4-L5 level     Family History  Problem Relation Age of Onset   Diabetes Mother    Liver disease Mother    COPD Father    Diabetes Father    Heart disease Maternal Grandmother    Cancer Maternal Grandfather    Cancer  Paternal Grandmother    Diabetes Paternal Grandfather    Past Surgical History:  Procedure Laterality Date   ABDOMINAL HYSTERECTOMY     partial, ovary sparing   CESAREAN SECTION     COLONOSCOPY WITH PROPOFOL N/A 08/17/2021   Procedure: COLONOSCOPY WITH PROPOFOL;  Surgeon: Harvel Quale, MD;  [facility];  Service: Gastroenterology;  Laterality: N/A;  [place]  08/17/2021   Procedure: HEMOSTASIS CLIP PLACEMENT;  Surgeon: Harvel Quale, MD;  Location: [facility];  Service: Gastroenterology;;   POLYPECTOMY  08/17/2021   Procedure: POLYPECTOMY;  Surgeon: Harvel Quale, MD;  Location: [facility];  Service: Gastroenterology;;   SUBMUCOSAL LIFTING INJECTION  08/17/2021   Procedure: SUBMUCOSAL LIFTING INJECTION;  Surgeon: Montez Morita, Quillian Quince, MD;  Location: [facility];  Service: Gastroenterology;;   SUBMUCOSAL TATTOO INJECTION  08/17/2021   Procedure: SUBMUCOSAL TATTOO INJECTION;  Surgeon: Harvel Quale, MD;  [facility]: [facility];  Service: Gastroenterology;;   WISDOM TOOTH EXTRACTION Bilateral    Social History   Social History Narrative   Right handed   Caffeine use: no tea, Coffee-tries to drink decaf, diet sodas   Mother also sees me, Butch Penny Robinette    There is no immunization history on file for this patient.   Objective: Vital Signs: BP 120/82 (BP Location: Left Arm, Patient Position: Sitting, Cuff Size: Normal)   Pulse 76   Resp 16   Ht 5' 3"  (1.6 m)   Wt 277 lb (125.6 kg)   BMI 49.07 kg/m    Physical Exam Vitals and nursing note reviewed.  Constitutional:      Appearance: She is well-developed.  HENT:     Head: Normocephalic and atraumatic.  Eyes:     Conjunctiva/sclera: Conjunctivae normal.  Cardiovascular:     Rate and Rhythm: Normal rate and regular rhythm.     Heart sounds: Normal heart sounds.  Pulmonary:     Effort: Pulmonary effort is normal.     Breath sounds:  Normal breath sounds.  Abdominal:     General: Bowel sounds are normal.     Palpations: Abdomen is soft.  Musculoskeletal:     Cervical back: Normal range of motion.  Skin:    General: Skin is warm and dry.     Capillary Refill: Capillary refill takes less than 2 seconds.  Neurological:     Mental Status: She is alert and oriented to person, place, and time.  Psychiatric:        Behavior: Behavior normal.      Musculoskeletal Exam: Generalized hyperalgesia and positive tender points on examination.  C-spine has good range of motion.  Trapezius muscle tension and tenderness bilaterally.  Shoulder joints have good range of motion with no discomfort.  Elbow joints have good range of motion with no tenderness or synovitis.  Wrist joints, MCPs, PIPs, DIPs have good range of motion with no synovitis.  Complete fist formation bilaterally.  Hip joints have good range of motion with discomfort bilaterally.  Tenderness over bilateral trochanteric bursa.  Knee joints have good range of motion with  no warmth or effusion.  Ankle joints have good range of motion with no synovitis.  Tenderness along the plantar fascia of both feet.  No tenderness over MTP joints.  CDAI Exam: CDAI Score: -- Patient Global: --; Provider Global: -- Swollen: --; Tender: -- Joint Exam 08/23/2021   No joint exam has been documented for this visit   There is currently no information documented on the homunculus. Go to the Rheumatology activity and complete the homunculus joint exam.  Investigation: No additional findings.  Imaging: No results found.  Recent Labs: Lab Results  Component Value Date   WBC 5.3 05/17/2021   HGB 12.3 05/17/2021   PLT 259 05/17/2021   NA 138 05/17/2021   K 4.3 05/17/2021   CL 106 05/17/2021   CO2 30 05/17/2021   GLUCOSE 93 05/17/2021   BUN 14 05/17/2021   CREATININE 0.77 05/17/2021   BILITOT 0.6 05/17/2021   ALKPHOS 68 05/17/2021   AST 16 05/17/2021   ALT 17 05/17/2021   PROT  6.6 05/17/2021   ALBUMIN 3.4 (L) 05/17/2021   CALCIUM 8.6 (L) 05/17/2021    Speciality Comments: Discussed viscosupplement injections at the next visit if she has an adequate response to cortisone injection, exercise, physical therapy and NSAID use.  Procedures:  No procedures performed Allergies: Keflex [cephalexin]    Ultrasound  Labs Refer to plantar fascia  Assessment / Plan:     Visit Diagnoses: Primary osteoarthritis of both hands - Clinical and radiographic findings are consistent with osteoarthritis.  X-rays of both hands on 01/28/2021 were consistent with early osteoarthritic changes.  No erosive changes were noted.  All autoimmune work-up negative.  She continues to experience pain in both hands and intermittent joint swelling.  On examination today no synovitis was noted.  Her symptoms seem consistent with osteoarthritis and fibromyalgia overlap.  Given her history of elevated ESR and CRP we will schedule an ultrasound of both hands to assess for synovitis.  She would also like to have ESR and CRP repeated today. She was advised to avoid the use of NSAIDs 7-10 days prior to the ultrasound.  She was given a list of natural antiinflammatories to start taking in the future.  Discussed the importance of joint protection and muscle strengthening.   Bilateral hand numbness - Patient was referred to neurology.  Patient was evaluated by Dr. Krista Blue.   Primary osteoarthritis of both knees - Bilateral moderate osteoarthritis and moderate chondromalacia patella was noted. She has good ROM of both knee joints.  No warmth or effusion noted.  She had a left knee cortisone injection on 04/07/21 which provided temporary relief.   Primary osteoarthritis of both feet - Clinical and radiographic findings are consistent with osteoarthritis.   She is experiencing pain in both feet consistent with plantar fasciitis.  Different treatment options were discussed today.  A referral to podiatry was placed today.    Plantar fasciitis, bilateral: A referral to podiatry was placed today.   Numbness in feet - Resolved.   DDD (degenerative disc disease), lumbar - History of degenerative disc disease and spinal stenosis per patient.  Chronic pain.  Followed by pain management.  Taking hydrocodone as needed.   Elevated sed rate -ESR elevated since November 2022. CRP is also elevated.  All autoimmune work-up was negative.  ESR and CRP will be rechecked today.  Ultrasound of both hands will be ordered for further evaluation. - Plan: Sedimentation rate, C-reactive protein  Fibromyalgia: She has generalized hyperalgesia and positive tender points.  She continues to have recurrent flares due to fibromyalgia.  Discussed different treatment options in detail and emphasized the importance of regular exercise and good sleep hygiene.  She continues to follow-up with pain management and is taking Lyrica and Cymbalta as prescribed.  She may benefit from the use of a muscle relaxer during flares if she continues to have recurrent muscle spasms.  She is taking hydrocodone for pain relief currently.  Other fatigue: Chronic and secondary to insomnia.  Discussed the importance of regular exercise and good sleep hygiene.  Other insomnia: Discussed the importance of good sleep hygiene.   Other medical conditions are listed as follows:   Vitamin D deficiency  History of gastroesophageal reflux (GERD)  History of anxiety  Onychomycosis  Former smoker - Half a pack per day for 15 years.  She quit smoking in May 2022.  Family history of fibromyalgia  Orders: Orders Placed This Encounter  Procedures   Sedimentation rate   C-reactive protein   Ambulatory referral to Podiatry   No orders of the defined types were placed in this encounter.    Follow-Up Instructions: Return for Osteoarthritis, DDD, Fibromyalgia.   Ofilia Neas, PA-C  Note - This record has been created using Dragon software.  Chart creation  errors have been sought, but may not always  have been located. Such creation errors do not reflect on  the standard of medical care.

## 2021-08-17 NOTE — Anesthesia Preprocedure Evaluation (Addendum)
Anesthesia Evaluation  Patient identified by MRN, date of birth, ID band Patient awake    Reviewed: Allergy & Precautions, NPO status , Patient's Chart, lab work & pertinent test results  Airway Mallampati: II  TM Distance: >3 FB Neck ROM: Full    Dental  (+) Dental Advisory Given, Missing, Chipped   Pulmonary shortness of breath and with exertion, neg sleep apnea, former smoker,    Pulmonary exam normal breath sounds clear to auscultation       Cardiovascular negative cardio ROS Normal cardiovascular exam Rhythm:Regular Rate:Normal     Neuro/Psych  Headaches, PSYCHIATRIC DISORDERS Anxiety  Neuromuscular disease    GI/Hepatic Neg liver ROS, GERD  Medicated and Controlled,  Endo/Other  Morbid obesity  Renal/GU negative Renal ROS  negative genitourinary   Musculoskeletal  (+) Arthritis , Osteoarthritis,    Abdominal   Peds negative pediatric ROS (+)  Hematology negative hematology ROS (+)   Anesthesia Other Findings   Reproductive/Obstetrics negative OB ROS                            Anesthesia Physical Anesthesia Plan  ASA: 3  Anesthesia Plan: General   Post-op Pain Management: Minimal or no pain anticipated   Induction: Intravenous  PONV Risk Score and Plan: Propofol infusion  Airway Management Planned: Nasal Cannula and Natural Airway  Additional Equipment:   Intra-op Plan:   Post-operative Plan:   Informed Consent: I have reviewed the patients History and Physical, chart, labs and discussed the procedure including the risks, benefits and alternatives for the proposed anesthesia with the patient or authorized representative who has indicated his/her understanding and acceptance.     Dental advisory given  Plan Discussed with: CRNA and Surgeon  Anesthesia Plan Comments:         Anesthesia Quick Evaluation

## 2021-08-17 NOTE — Op Note (Signed)
[facility] Patient Name: Selena Howard Procedure Date: 08/17/2021 8:07 AM MRN: [id number] Date of Birth: 30-Jul-1975 Attending MD: Maylon Peppers ,  CSN: [id number] Age: 46 Admit Type: Outpatient Procedure:                Colonoscopy Indications:              Colon cancer screening in patient at increased                            risk: Family history of 1st-degree relative with                            colon polyps before age 26 years, Surveillance:                            Personal history of adenomatous polyps on last                            colonoscopy > 5 years ago Providers:                Maylon Peppers, [place]. Theda Sers RN, RN, [place]                            Page Referring MD:              Medicines:                Monitored Anesthesia Care Complications:            No immediate complications. Estimated Blood Loss:     Estimated blood loss was minimal. Procedure:                Pre-Anesthesia Assessment:                           - Prior to the procedure, a History and Physical                            was performed, and patient medications, allergies                            and sensitivities were reviewed. The patient's                            tolerance of previous anesthesia was reviewed.                           - The risks and benefits of the procedure and the                            sedation options and risks were discussed with the                            patient. All questions were answered and informed  consent was obtained.                           - ASA Grade Assessment: II - A patient with mild                            systemic disease.                           After obtaining informed consent, the colonoscope                            was passed under direct vision. Throughout the                            procedure, the patient's blood pressure, pulse, and                            oxygen  saturations were monitored continuously. The                            [id number]-HQ190L ([id number]) scope was introduced through                            the anus and advanced to the the cecum, identified                            by appendiceal orifice and ileocecal valve. The                            colonoscopy was performed without difficulty. The                            patient tolerated the procedure well. The quality                            of the bowel preparation was adequate. Scope In: 8:24:13 AM Scope Out: 10:00:53 AM Scope Withdrawal Time: 1 hour 30 minutes 23 seconds  Total Procedure Duration: 1 hour 36 minutes 40 seconds  Findings:      The perianal and digital rectal examinations were normal.      A 8 mm polyp was found in the ascending colon. The polyp was sessile.       The polyp was removed with a cold snare. Resection and retrieval were       complete.      A 20 to 25 mm polyp was found in the proximal transverse colon. The       polyp was flat. Area was successfully injected with 13 mL Eleview for a       lift polypectomy. Imaging was performed using white light and narrow       band imaging to visualize the mucosa and demarcate the polyp site after       injection for EMR purposes. The polyp was removed with a piecemeal       technique using a hot snare. Resection and retrieval were complete. To  close a defect after mucosal resection, four hemostatic clips were       successfully placed (one of the clips was an Ultra clip). There was no       bleeding at the end of the procedure. An area 2cm distal and       contralateral to the polpyectomy site was tattooed with an injection of       1 mL of [place] ink.      A 12 mm polyp was found in the transverse colon. The polyp was       semi-sessile. This polyp was contralateral to the larger polyp removed       today. The polyp was removed with a piecemeal technique using a hot       snare. Resection and retrieval  were complete. To prevent bleeding after       the polypectomy, two hemostatic clips were successfully placed. There       was no bleeding at the end of the procedure.      Two sessile polyps were found in the descending colon. The polyps were 5       to 6 mm in size. These polyps were removed with a cold snare. Resection       and retrieval were complete.      The retroflexed view of the distal rectum and anal verge was normal and       showed no anal or rectal abnormalities. Impression:               - One 8 mm polyp in the ascending colon, removed                            with a cold snare. Resected and retrieved.                           - One 20 to 25 mm polyp in the proximal transverse                            colon, removed piecemeal using a hot snare.                            Resected and retrieved via EMR. Injected. Clips                            were placed. Tattooed.                           - One 12 mm polyp in the transverse colon, removed                            piecemeal using a hot snare. Resected and                            retrieved. Clips were placed.                           - Two 5 to 6 mm polyps in the descending colon,  removed with a cold snare. Resected and retrieved.                           - The distal rectum and anal verge are normal on                            retroflexion view. Moderate Sedation:      Per Anesthesia Care Recommendation:           - Discharge patient to home (ambulatory).                           - Resume previous diet.                           - Await pathology results.                           - Repeat colonoscopy in 6 months for surveillance                            after piecemeal polypectomy. Procedure Code(s):        --- Professional ---                           [id number], 4, Colonoscopy, flexible; with removal of                            tumor(s), polyp(s), or other lesion(s) by  snare                            technique                           45381, Colonoscopy, flexible; with directed                            submucosal injection(s), any substance Diagnosis Code(s):        --- Professional ---                           K63.5, Polyp of colon                           Z83.71, Family history of colonic polyps                           Z86.010, Personal history of colonic polyps CPT copyright 2019 American Medical Association. All rights reserved. The codes documented in this report are preliminary and upon coder review may  be revised to meet current compliance requirements. Maylon Peppers, MD Maylon Peppers,  08/17/2021 10:11:01 AM This report has been signed electronically. Number of Addenda: 0

## 2021-08-17 NOTE — Transfer of Care (Signed)
Immediate Anesthesia Transfer of Care Note  Patient: Selena Howard  Procedure(s) Performed: COLONOSCOPY WITH PROPOFOL POLYPECTOMY SUBMUCOSAL LIFTING INJECTION HEMOSTASIS CLIP PLACEMENT SUBMUCOSAL TATTOO INJECTION  Patient Location: PACU  Anesthesia Type:General  Level of Consciousness: awake, alert  and oriented  Airway & Oxygen Therapy: Patient Spontanous Breathing  Post-op Assessment: Report given to RN, Post -op Vital signs reviewed and stable, Patient moving all extremities X 4 and Patient able to stick tongue midline  Post vital signs: Reviewed  Last Vitals:  Vitals Value Taken Time  BP 122/69   Temp 98.6   Pulse 76   Resp 18   SpO2 100     Last Pain:  Vitals:   08/17/21 0816  TempSrc:   PainSc: 6       Patients Stated Pain Goal: 9 (51/02/58 5277)  Complications: No notable events documented.

## 2021-08-18 ENCOUNTER — Other Ambulatory Visit: Payer: Self-pay | Admitting: Family Medicine

## 2021-08-18 ENCOUNTER — Encounter (INDEPENDENT_AMBULATORY_CARE_PROVIDER_SITE_OTHER): Payer: Self-pay | Admitting: *Deleted

## 2021-08-18 DIAGNOSIS — M797 Fibromyalgia: Secondary | ICD-10-CM

## 2021-08-18 LAB — SURGICAL PATHOLOGY

## 2021-08-19 ENCOUNTER — Other Ambulatory Visit: Payer: Self-pay | Admitting: Family Medicine

## 2021-08-19 DIAGNOSIS — M797 Fibromyalgia: Secondary | ICD-10-CM

## 2021-08-19 MED ORDER — PREGABALIN 75 MG PO CAPS: 75.0000 mg | ORAL_CAPSULE | Freq: Two times a day (BID) | ORAL | 5 refills | Status: DC

## 2021-08-22 ENCOUNTER — Encounter (HOSPITAL_COMMUNITY): Payer: Self-pay | Admitting: Gastroenterology

## 2021-08-23 ENCOUNTER — Encounter: Payer: Self-pay | Admitting: Physician Assistant

## 2021-08-23 ENCOUNTER — Ambulatory Visit: Payer: Medicaid Other | Attending: Physician Assistant | Admitting: Physician Assistant

## 2021-08-23 VITALS — BP 120/82 | HR 76 | Resp 16 | Ht 63.0 in | Wt 277.0 lb

## 2021-08-23 DIAGNOSIS — M5136 Other intervertebral disc degeneration, lumbar region: Secondary | ICD-10-CM

## 2021-08-23 DIAGNOSIS — M170 Bilateral primary osteoarthritis of knee: Secondary | ICD-10-CM

## 2021-08-23 DIAGNOSIS — Z87891 Personal history of nicotine dependence: Secondary | ICD-10-CM

## 2021-08-23 DIAGNOSIS — Z8719 Personal history of other diseases of the digestive system: Secondary | ICD-10-CM

## 2021-08-23 DIAGNOSIS — M722 Plantar fascial fibromatosis: Secondary | ICD-10-CM

## 2021-08-23 DIAGNOSIS — E559 Vitamin D deficiency, unspecified: Secondary | ICD-10-CM

## 2021-08-23 DIAGNOSIS — B351 Tinea unguium: Secondary | ICD-10-CM

## 2021-08-23 DIAGNOSIS — R5383 Other fatigue: Secondary | ICD-10-CM

## 2021-08-23 DIAGNOSIS — M19041 Primary osteoarthritis, right hand: Secondary | ICD-10-CM

## 2021-08-23 DIAGNOSIS — Z8269 Family history of other diseases of the musculoskeletal system and connective tissue: Secondary | ICD-10-CM

## 2021-08-23 DIAGNOSIS — R200 Anesthesia of skin: Secondary | ICD-10-CM

## 2021-08-23 DIAGNOSIS — M19071 Primary osteoarthritis, right ankle and foot: Secondary | ICD-10-CM

## 2021-08-23 DIAGNOSIS — M19042 Primary osteoarthritis, left hand: Secondary | ICD-10-CM

## 2021-08-23 DIAGNOSIS — Z8659 Personal history of other mental and behavioral disorders: Secondary | ICD-10-CM

## 2021-08-23 DIAGNOSIS — M797 Fibromyalgia: Secondary | ICD-10-CM

## 2021-08-23 DIAGNOSIS — R700 Elevated erythrocyte sedimentation rate: Secondary | ICD-10-CM

## 2021-08-23 DIAGNOSIS — G4709 Other insomnia: Secondary | ICD-10-CM

## 2021-08-23 DIAGNOSIS — M19072 Primary osteoarthritis, left ankle and foot: Secondary | ICD-10-CM

## 2021-08-24 LAB — C-REACTIVE PROTEIN: CRP: 31.8 mg/L — ABNORMAL HIGH (ref <8.0)

## 2021-08-24 LAB — SEDIMENTATION RATE: Sed Rate: 19 mm/h (ref 0–20)

## 2021-08-24 NOTE — Progress Notes (Signed)
CRP remains elevated but is lower than last time.  ESR is WNL.

## 2021-08-29 ENCOUNTER — Ambulatory Visit (INDEPENDENT_AMBULATORY_CARE_PROVIDER_SITE_OTHER): Payer: Medicaid Other

## 2021-08-29 ENCOUNTER — Ambulatory Visit: Payer: Medicaid Other | Admitting: Podiatry

## 2021-08-29 DIAGNOSIS — M722 Plantar fascial fibromatosis: Secondary | ICD-10-CM

## 2021-08-29 MED ORDER — BETAMETHASONE SOD PHOS & ACET 6 (3-3) MG/ML IJ SUSP
3.0000 mg | Freq: Once | INTRAMUSCULAR | Status: AC
  Administered 2021-08-29: 3 mg via INTRA_ARTICULAR

## 2021-08-29 MED ORDER — METHYLPREDNISOLONE 4 MG PO TBPK: ORAL_TABLET | ORAL | 0 refills | Status: DC

## 2021-08-29 NOTE — Progress Notes (Signed)
   Chief Complaint  Patient presents with   Plantar Fasciitis    Patient is here for possible plantar fasciitis, she has had pain in both feet for 1 year that seems to be getting worse.    Subjective: 46 y.o. female presenting today as a new patient for evaluation of pain and tenderness to the bilateral feet.  Patient states that over the last few months the pain has increased.  She denies a history of injury.  She says that she has pain on a daily basis.  Presenting for further treatment and evaluation   Past Medical History:  Diagnosis Date   Anxiety    Arthritis    Endometriosis    now s/p partial hysterectomy 06/2020   GERD (gastroesophageal reflux disease)    Osteoarthritis    Spinal stenosis at L4-L5 level      Objective: Physical Exam General: The patient is alert and oriented x3 in no acute distress.  Dermatology: Skin is warm, dry and supple bilateral lower extremities. Negative for open lesions or macerations bilateral.   Vascular: Dorsalis Pedis and Posterior Tibial pulses palpable bilateral.  Capillary fill time is immediate to all digits.  Neurological: Epicritic and protective threshold intact bilateral.   Musculoskeletal: Tenderness to palpation to the plantar aspect of the left heel along the plantar fascia. To a lesser extent there is some tenderness throughout palpation to the right plantar all other joints range of motion within normal limits bilateral. Strength 5/5 in all groups bilateral.   Radiographic exam: Normal osseous mineralization. Joint spaces preserved. No fracture/dislocation/boney destruction. No other soft tissue abnormalities or radiopaque foreign bodies.   Assessment: 1. Plantar fasciitis left foot  Plan of Care:  1. Patient evaluated. Xrays reviewed.   2. Injection of 0.5cc Celestone soluspan injected into the left plantar fascia.  3. Rx for Medrol Dose Pak placed 4.  Patient has a prescription for meloxicam 15 mg daily at home.   Recommend that she takes daily as needed as prescribed 5.  Patient is also on Lyrica, Cymbalta, and chronic hydrocodone for fibromyalgia.  Continue as prescribed 6. Instructed patient regarding therapies and modalities at home to alleviate symptoms.  Advised against going barefoot.  Recommend good supportive shoes and sneakers 7. Return to clinic as needed   Edrick Kins, DPM [facility]  Dr. Edrick Kins, DPM    [address] [place], [place] [address]                Office [phone]  Fax [phone]

## 2021-09-01 ENCOUNTER — Ambulatory Visit: Payer: Medicaid Other | Admitting: Rheumatology

## 2021-09-01 DIAGNOSIS — M19041 Primary osteoarthritis, right hand: Secondary | ICD-10-CM

## 2021-09-01 DIAGNOSIS — M79641 Pain in right hand: Secondary | ICD-10-CM

## 2021-09-01 DIAGNOSIS — R200 Anesthesia of skin: Secondary | ICD-10-CM

## 2021-09-29 ENCOUNTER — Ambulatory Visit: Payer: Medicaid Other | Attending: Rheumatology | Admitting: Rheumatology

## 2021-09-29 ENCOUNTER — Ambulatory Visit: Payer: Medicaid Other

## 2021-09-29 DIAGNOSIS — M19041 Primary osteoarthritis, right hand: Secondary | ICD-10-CM | POA: Diagnosis not present

## 2021-09-29 DIAGNOSIS — R200 Anesthesia of skin: Secondary | ICD-10-CM

## 2021-09-29 DIAGNOSIS — M79642 Pain in left hand: Secondary | ICD-10-CM

## 2021-09-29 DIAGNOSIS — M19042 Primary osteoarthritis, left hand: Secondary | ICD-10-CM

## 2021-09-29 DIAGNOSIS — M79641 Pain in right hand: Secondary | ICD-10-CM

## 2021-09-29 NOTE — Progress Notes (Signed)
  Visit diagnosis: Pain in both hands  Patient has known history of osteoarthritis.  She has been complaining of pain and discomfort in her bilateral hands.  Ultrasound examination of bilateral hands was performed to rule out synovitis.  Ultrasound examination of bilateral hands was performed per EULAR recommendations. Using 15 MHz transducer, grayscale and power Doppler bilateral second, third, and fifth MCP joints and bilateral wrist joints both dorsal and volar aspects were evaluated to look for synovitis or tenosynovitis. The findings were there was no synovitis or tenosynovitis on ultrasound examination. Right median nerve was 0.10 cm squares which was within normal limits and left median nerve was 0.08 cm squares which was within normal limits.  Impression: Ultrasound examination did not show any synovitis or tenosynovitis.  Bilateral median nerves are within normal limits.   Ultrasound findings were discussed with the patient.  Bo Merino, MD

## 2021-10-10 ENCOUNTER — Ambulatory Visit: Payer: Medicaid Other | Admitting: Physician Assistant

## 2021-10-27 ENCOUNTER — Encounter (INDEPENDENT_AMBULATORY_CARE_PROVIDER_SITE_OTHER): Payer: Self-pay | Admitting: Gastroenterology

## 2021-10-27 ENCOUNTER — Ambulatory Visit (INDEPENDENT_AMBULATORY_CARE_PROVIDER_SITE_OTHER): Payer: Medicaid Other | Admitting: Gastroenterology

## 2021-10-29 ENCOUNTER — Ambulatory Visit: Payer: Medicaid Other | Admitting: Podiatry

## 2021-12-02 ENCOUNTER — Other Ambulatory Visit: Payer: Self-pay | Admitting: Family Medicine

## 2021-12-02 DIAGNOSIS — K219 Gastro-esophageal reflux disease without esophagitis: Secondary | ICD-10-CM

## 2021-12-07 ENCOUNTER — Encounter: Payer: Self-pay | Admitting: Podiatry

## 2021-12-07 ENCOUNTER — Ambulatory Visit: Payer: Medicaid Other | Admitting: Podiatry

## 2021-12-07 VITALS — BP 162/95 | HR 113

## 2021-12-07 DIAGNOSIS — M722 Plantar fascial fibromatosis: Secondary | ICD-10-CM

## 2021-12-07 MED ORDER — BETAMETHASONE SOD PHOS & ACET 6 (3-3) MG/ML IJ SUSP
3.0000 mg | Freq: Once | INTRAMUSCULAR | Status: AC
  Administered 2021-12-07: 3 mg via INTRA_ARTICULAR

## 2021-12-07 MED ORDER — METHYLPREDNISOLONE 4 MG PO TBPK: ORAL_TABLET | ORAL | 0 refills | Status: DC

## 2021-12-07 NOTE — Progress Notes (Signed)
Chief Complaint  Patient presents with   Foot Pain    Patient is here for left foot pain, wants injection.    Subjective: 46 y.o. female presenting today follow-up evaluation of pain and tenderness to the left heel.  Patient states that over the last few months the pain has increased.  Patient states that the heel felt much better after the injections however the pain slowly returned. Presenting for further treatment and evaluation  Past Medical History:  Diagnosis Date   Anxiety    Arthritis    Endometriosis    now s/p partial hysterectomy 06/2020   GERD (gastroesophageal reflux disease)    Osteoarthritis    Spinal stenosis at L4-L5 level    Past Surgical History:  Procedure Laterality Date   ABDOMINAL HYSTERECTOMY     partial, ovary sparing   CESAREAN SECTION     COLONOSCOPY WITH PROPOFOL N/A 08/17/2021   Procedure: COLONOSCOPY WITH PROPOFOL;  Surgeon: Harvel Quale, MD;  [facility]: [facility];  Service: Gastroenterology;  Laterality: N/A;  [place]  08/17/2021   Procedure: HEMOSTASIS CLIP PLACEMENT;  Surgeon: Harvel Quale, MD;  Location: [facility];  Service: Gastroenterology;;   POLYPECTOMY  08/17/2021   Procedure: POLYPECTOMY;  Surgeon: Harvel Quale, MD;  Location: [facility];  Service: Gastroenterology;;   SUBMUCOSAL LIFTING INJECTION  08/17/2021   Procedure: SUBMUCOSAL LIFTING INJECTION;  Surgeon: Harvel Quale, MD;  Location: [facility];  Service: Gastroenterology;;   SUBMUCOSAL TATTOO INJECTION  08/17/2021   Procedure: SUBMUCOSAL TATTOO INJECTION;  Surgeon: Harvel Quale, MD;  Location: [facility];  Service: Gastroenterology;;   WISDOM TOOTH EXTRACTION Bilateral    Allergies  Allergen Reactions   Keflex [Cephalexin] Nausea And Vomiting     Objective: Physical Exam General: The patient is alert and oriented x3 in no acute distress.  Dermatology: Skin is warm, dry and  supple bilateral lower extremities. Negative for open lesions or macerations bilateral.   Vascular: Dorsalis Pedis and Posterior Tibial pulses palpable bilateral.  Capillary fill time is immediate to all digits.  Neurological: Light touch and protective threshold intact bilateral.   Musculoskeletal: There continues to be some residual tenderness to palpation to the plantar aspect of the left heel along the plantar fascia. To a lesser extent there is some tenderness throughout palpation to the right plantar all other joints range of motion within normal limits bilateral. Strength 5/5 in all groups bilateral.   Radiographic exam LT foot 08/29/2021: Normal osseous mineralization. Joint spaces preserved. No fracture/dislocation/boney destruction. No other soft tissue abnormalities or radiopaque foreign bodies.   Assessment: 1. Plantar fasciitis left foot  Plan of Care:  1. Patient evaluated.  2. Injection of 0.5cc Celestone soluspan injected into the left plantar fascia.  3. Rx for Medrol Dose Pak placed 4.  Patient has a prescription for meloxicam 15 mg daily at home.  Recommend that she takes daily as needed as prescribed 5.  Patient is also on Lyrica, Cymbalta, and chronic hydrocodone for fibromyalgia.  Continue as prescribed 6. Instructed patient regarding therapies and modalities at home to alleviate symptoms.  Advised against going barefoot.  Recommend good supportive shoes and sneakers 7. Return to clinic as needed   Edrick Kins, DPM [facility]  Dr. Edrick Kins, DPM    [address]  [place], [place] [address]                Office [phone]  Fax [phone]

## 2021-12-13 ENCOUNTER — Encounter: Payer: Self-pay | Admitting: Family

## 2021-12-13 ENCOUNTER — Ambulatory Visit (INDEPENDENT_AMBULATORY_CARE_PROVIDER_SITE_OTHER): Payer: Medicaid Other

## 2021-12-13 ENCOUNTER — Ambulatory Visit: Payer: Medicaid Other | Admitting: Family

## 2021-12-13 VITALS — BP 135/80 | HR 93 | Temp 98.6°F | Ht 63.0 in | Wt 272.0 lb

## 2021-12-13 DIAGNOSIS — M542 Cervicalgia: Secondary | ICD-10-CM

## 2021-12-13 DIAGNOSIS — W19XXXA Unspecified fall, initial encounter: Secondary | ICD-10-CM

## 2021-12-13 DIAGNOSIS — Y92009 Unspecified place in unspecified non-institutional (private) residence as the place of occurrence of the external cause: Secondary | ICD-10-CM

## 2021-12-13 DIAGNOSIS — G44311 Acute post-traumatic headache, intractable: Secondary | ICD-10-CM

## 2021-12-13 DIAGNOSIS — S060X0A Concussion without loss of consciousness, initial encounter: Secondary | ICD-10-CM | POA: Diagnosis not present

## 2021-12-13 NOTE — Patient Instructions (Signed)
Concussion, Adult  A concussion is a brain injury from a hard, direct hit (trauma) to the head or body. This direct hit causes the brain to shake quickly back and forth inside the skull. This can damage brain cells and cause chemical changes in the brain. A concussion may also be known as a mild traumatic brain injury (TBI). The effects of a concussion can be serious. If you have a concussion, you should be very careful to avoid having a second concussion. What are the causes? This condition is caused by: A direct hit to your head. Sudden movement of your body that causes your brain to move back and forth inside the skull, such as in a car crash. What are the signs or symptoms? The signs of a concussion can be hard to notice. Early on, they may be missed by you, family members, and health care providers. You may look fine on the outside but may act or feel differently. Every head injury is different. Symptoms are usually temporary but may last for days, weeks, or even months. Some symptoms appear right away, but other symptoms may not show up for hours or days. Physical symptoms Headaches. Dizziness and problems with coordination or balance. Sensitivity to light or noise. Nausea or vomiting. Tiredness (fatigue). Vision or hearing problems. Seizure. Mental and emotional symptoms Irritability or mood changes. Memory problems. Trouble concentrating, organizing, or making decisions. Changes in eating or sleeping patterns. Slowness in thinking, acting or reacting, speaking, or reading. Anxiety or depression. How is this diagnosed? This condition is diagnosed based on your symptoms and injury. You may also have tests, including: Imaging tests, such as a CT scan or an MRI. Neuropsychological tests. These measure your thinking, understanding, learning, and memory. How is this treated? Treatment for this condition includes: Stopping sports or activity if you are injured. Physical and mental  rest and careful observation, usually at home. Medicines to help with symptoms such as headaches, nausea, or difficulty sleeping. Referral to a concussion clinic or rehab center. Follow these instructions at home: Activity Limit activities that require a lot of thought or concentration, such as: Doing homework or job-related work. Watching TV. Using the computer or phone. Playing memory games and doing puzzles. Rest helps your brain heal. Make sure you: Get plenty of sleep. Most adults should get 7-9 hours of sleep each night. Rest during the day. Take naps or rest breaks when you feel tired. Avoid high-intensity exercise or physical activities that take a lot of effort. Stop any activity that worsens symptoms. Your health care provider may recommend light exercise such as walking. Do not do high-risk activities that could cause a second concussion, such as riding a bike or playing sports. Ask your health care provider when you can return to your normal activities, such as school, work, sports, and driving. Your ability to react may be slower after a brain injury. Never do these activities if you are dizzy. General instructions  Take over-the-counter and prescription medicines only as told by your health care provider. Some medicines, such as blood thinners (anticoagulants) and aspirin, may increase the risk for complications, such as bleeding. Avoid taking opioid pain medicine while recovering from a concussion. Do not drink alcohol until your health care provider says you can. Drinking alcohol may slow your recovery and can put you at risk of further injury. Watch your symptoms and tell others around you to do the same. Complications sometimes occur after a concussion. Tell your work Freight forwarder, teachers, Government social research officer,  school counselor, coach, or sports trainer about your injury, symptoms, and restrictions. See a mental health therapist if you feel anxious or depressed. Managing this condition  can be challenging. Keep all follow-up visits. Your health care provider will check on your recovery and give you a plan for returning to activities. How is this prevented? Avoiding another brain injury is very important. In rare cases, another injury can lead to permanent brain damage, brain swelling, or death. The risk of this is greatest during the first 7-10 days after a head injury. Avoid injuries by: Stopping activities that could lead to a second concussion, such as contact or recreational sports, until your health care provider says it is okay. Taking these actions once you have returned to sports or activities: Avoid plays or moves that can cause you to crash into another person. This is how most concussions occur. Follow the rules and be respectful of other players. Do not engage in violent or illegal plays. Getting regular exercise that includes strength and balance training. Wearing a properly fitting helmet during sports, biking, or other activities. Helmets can help protect you from serious skull and brain injuries, but they may not protect you from a concussion. Even when wearing a helmet, you should avoid being hit in the head. Where to find more information Centers for Disease Control and Prevention: [employer]cy Contact a health care provider if: Your symptoms do not improve or get worse. You have new symptoms. You have another injury. Your coordination gets worse. You have unusual behavior changes. Get help right away if: You have a severe or worsening headache. You have weakness or numbness in any part of your body, slurred speech, vision changes, or confusion. You vomit repeatedly. You lose consciousness, are sleepier than normal, or are difficult to wake up. You have a seizure. These symptoms may be an emergency. Get help right away. Call 911. Do not wait to see if the symptoms will go away. Do not drive yourself to the hospital. Also, get help right away if: You have  thoughts of hurting yourself or others. Take one of these steps if you feel like you may hurt yourself or others, or have thoughts about taking your own life: Go to your nearest emergency room. Call 911. Call the [place] at [phone] or [phone]. This is open 24 hours a day. Text the Crisis Text Line at 9083033127. This information is not intended to replace advice given to you by your health care provider. Make sure you discuss any questions you have with your health care provider. Document Revised: 05/13/2021 Document Reviewed: 05/13/2021 Elsevier Patient Education  [place].

## 2021-12-13 NOTE — Progress Notes (Signed)
Subjective:    Patient ID: Selena Howard, female    DOB: 1975/06/10, 46 y.o.   MRN: [id number]  Chief Complaint  Patient presents with   Fall    Of the bed hit head and neck. Bad head ache    PT presents to the office today with neck pain and headache that happened after falling out of the bed yesterday morning. She reports she rolled out of her bed and hit her neck.  Fall The accident occurred 12 to 24 hours ago. There was no blood loss. Pertinent negatives include no visual change.  Neck Pain  This is a new problem. The current episode started yesterday. The problem occurs constantly. The problem has been waxing and waning. The pain is associated with a fall. The pain is present in the left side. The quality of the pain is described as stabbing. The pain is at a severity of 8/10. The pain is mild. The symptoms are aggravated by twisting and position. Associated symptoms include photophobia. Pertinent negatives include no trouble swallowing, visual change or weakness. Associated symptoms comments: Nausea . She has tried acetaminophen (norco and lyrica) for the symptoms. The treatment provided mild relief.      Review of Systems  HENT:  Negative for trouble swallowing.   Eyes:  Positive for photophobia.  Musculoskeletal:  Positive for neck pain.  Neurological:  Negative for weakness.  All other systems reviewed and are negative.      Objective:   Physical Exam Vitals reviewed.  Constitutional:      General: She is not in acute distress.    Appearance: She is well-developed. She is obese.  HENT:     Head: Normocephalic and atraumatic.  Eyes:     Pupils: Pupils are equal, round, and reactive to light.  Neck:     Thyroid: No thyromegaly.  Cardiovascular:     Rate and Rhythm: Normal rate and regular rhythm.     Heart sounds: Normal heart sounds. No murmur heard. Pulmonary:     Effort: Pulmonary effort is normal. No respiratory distress.     Breath sounds: Normal breath  sounds. No wheezing.  Abdominal:     General: Bowel sounds are normal. There is no distension.     Palpations: Abdomen is soft.     Tenderness: There is no abdominal tenderness.  Musculoskeletal:        General: No tenderness. Normal range of motion.     Cervical back: Normal range of motion and neck supple.  Skin:    General: Skin is warm and dry.  Neurological:     Mental Status: She is alert and oriented to person, place, and time.     Cranial Nerves: No cranial nerve deficit.     Deep Tendon Reflexes: Reflexes are normal and symmetric.  Psychiatric:        Behavior: Behavior normal.        Thought Content: Thought content normal.        Judgment: Judgment normal.    BP 135/80   Pulse 93   Temp 98.6 F (37 C) (Temporal)   Ht '5\' 3"'$  (1.6 m)   Wt 272 lb (123.4 kg)   BMI 48.18 kg/m        Assessment & Plan:  Selena Howard comes in today with chief complaint of Fall (Of the bed hit head and neck. Bad head ache )   Diagnosis and orders addressed:  1. Neck pain Rest ROM exercise  Continue mobic, lyrica,  and Norco  - DG Cervical Spine Complete  2. Fall in home, initial encounter   3. Intractable acute post-traumatic headache   4. Concussion without loss of consciousness, initial encounter Discussed symptoms Red flags discussed to go to ED- changes with gait, speech, or vision   Labs pending Health Maintenance reviewed Diet and exercise encouraged  Follow up plan: Keep follow up with PCP   Evelina Dun, FNP

## 2021-12-14 ENCOUNTER — Ambulatory Visit: Payer: Medicaid Other | Admitting: Family Medicine

## 2021-12-30 ENCOUNTER — Other Ambulatory Visit: Payer: Self-pay | Admitting: Family Medicine

## 2021-12-30 ENCOUNTER — Encounter: Payer: Self-pay | Admitting: Family Medicine

## 2021-12-30 ENCOUNTER — Ambulatory Visit: Payer: Medicaid Other | Admitting: Family Medicine

## 2021-12-30 VITALS — BP 134/88 | HR 83 | Temp 96.7°F | Ht 63.0 in | Wt 274.6 lb

## 2021-12-30 DIAGNOSIS — M1990 Unspecified osteoarthritis, unspecified site: Secondary | ICD-10-CM | POA: Insufficient documentation

## 2021-12-30 DIAGNOSIS — M5412 Radiculopathy, cervical region: Secondary | ICD-10-CM

## 2021-12-30 DIAGNOSIS — R202 Paresthesia of skin: Secondary | ICD-10-CM | POA: Diagnosis not present

## 2021-12-30 DIAGNOSIS — K219 Gastro-esophageal reflux disease without esophagitis: Secondary | ICD-10-CM

## 2021-12-30 MED ORDER — PREDNISONE 10 MG (21) PO TBPK: ORAL_TABLET | ORAL | 0 refills | Status: DC

## 2021-12-30 NOTE — Progress Notes (Signed)
Subjective:  Patient ID: Selena Howard, female    DOB: [birth year], 46 y.o.   MRN: [id number]  Patient Care Team: [name], DO as PCP - General (Family Medicine) [name], DO as Consulting Physician (Family Medicine) Bo Merino, MD as Consulting Physician (Rheumatology) Marcial Pacas, MD as Consulting Physician (Neurology) Marybelle Killings, MD as Consulting Physician (Orthopedic Surgery)   Chief Complaint:  Hand Pain (Bilateral hand pains that go numb and tingle in the middle of the night x 2-3 weeks.  Patient states it will wake her up from her sleep and has gotten worse. )   HPI: Selena Howard is a 46 y.o. female presenting on 12/30/2021 for Hand Pain (Bilateral hand pains that go numb and tingle in the middle of the night x 2-3 weeks.  Patient states it will wake her up from her sleep and has gotten worse. )   Hand Pain  Incident onset: more than 2 weeks ago. The injury mechanism was a fall (fell out of bed and caused pain in her neck and right shoulder. C-Spine imaging revealed degenerative changes. Now having tingling and numbness to her hands and fingers at times, worse during the night.). The pain is present in the right hand, left hand and right shoulder. The quality of the pain is described as aching, burning, shooting and stabbing. The pain is moderate. The pain has been Fluctuating since the incident. Associated symptoms include numbness and tingling. Pertinent negatives include no chest pain or muscle weakness. The symptoms are aggravated by movement, palpation and lifting. Treatments tried: Lyrica. The treatment provided no relief.     Relevant past medical, surgical, family, and social history reviewed and updated as indicated.  Allergies and medications reviewed and updated. Data reviewed: Chart in Epic.   Past Medical History:  Diagnosis Date   Anxiety    Arthritis    Endometriosis    now s/p partial hysterectomy 06/2020   GERD  (gastroesophageal reflux disease)    Osteoarthritis    Spinal stenosis at L4-L5 level     Past Surgical History:  Procedure Laterality Date   ABDOMINAL HYSTERECTOMY     partial, ovary sparing   CESAREAN SECTION     COLONOSCOPY WITH PROPOFOL N/A 08/17/2021   Procedure: COLONOSCOPY WITH PROPOFOL;  Surgeon: Harvel Quale, MD;  [facility];  Service: Gastroenterology;  Laterality: N/A;  [place]  08/17/2021   Procedure: HEMOSTASIS CLIP PLACEMENT;  Surgeon: Harvel Quale, MD;  Location: [facility];  Service: Gastroenterology;;   POLYPECTOMY  08/17/2021   Procedure: POLYPECTOMY;  Surgeon: Montez Morita, Quillian Quince, MD;  [facility];  Service: Gastroenterology;;   SUBMUCOSAL LIFTING INJECTION  08/17/2021   Procedure: SUBMUCOSAL LIFTING INJECTION;  Surgeon: Harvel Quale, MD;  [facility];  Service: Gastroenterology;;   SUBMUCOSAL TATTOO INJECTION  08/17/2021   Procedure: SUBMUCOSAL TATTOO INJECTION;  Surgeon: Harvel Quale, MD;  [facility];  Service: Gastroenterology;;   WISDOM TOOTH EXTRACTION Bilateral     Social History   Socioeconomic History   Marital status: Divorced    Spouse name: Not on file   Number of children: 3   Years of education: 12   Highest education level: High school graduate  Occupational History   Not on file  Tobacco Use   Smoking status: Former    Packs/day: 0.50    Years: 10.00    Total pack years: 5.00  Types: Cigarettes    Quit date: 06/02/2020    Years since quitting: 1.5    Passive exposure: Past   Smokeless tobacco: Never  Vaping Use   Vaping Use: Never used  Substance and Sexual Activity   Alcohol use: Not Currently   Drug use: Not Currently   Sexual activity: Not Currently    Birth control/protection: Surgical  Other Topics Concern   Not on file  Social History Narrative   Right handed   Caffeine use: no tea, Coffee-tries  to drink decaf, diet sodas   Mother also sees me, [name]   Social Determinants of Health   Financial Resource Strain: Not on file  Food Insecurity: Not on file  Transportation Needs: Not on file  Physical Activity: Not on file  Stress: Not on file  Social Connections: Not on file  Intimate Partner Violence: Not on file    Outpatient Encounter Medications as of 12/30/2021  Medication Sig   betamethasone valerate (VALISONE) 0.1 % cream Apply 1 Application topically daily as needed (foor rash).   DULoxetine (CYMBALTA) 60 MG capsule Take 60 mg by mouth daily.   esomeprazole (NEXIUM) 40 MG capsule Take 1 capsule (40 mg total) by mouth daily. (NEEDS TO BE SEEN BEFORE NEXT REFILL)   fluticasone (FLONASE) 50 MCG/ACT nasal spray Place 2 sprays into both nostrils daily.   HYDROcodone-acetaminophen (NORCO) 7.5-325 MG tablet Take 1 tablet by mouth every 6 (six) hours as needed for moderate pain.   meloxicam (MOBIC) 15 MG tablet Take 1 tablet (15 mg total) by mouth daily.   predniSONE (STERAPRED UNI-PAK 21 TAB) 10 MG (21) TBPK tablet As directed x 6 days   pregabalin (LYRICA) 75 MG capsule Take 1 capsule (75 mg total) by mouth 2 (two) times daily.   No facility-administered encounter medications on file as of 12/30/2021.    Allergies  Allergen Reactions   Keflex [Cephalexin] Nausea And Vomiting    Review of Systems  Constitutional:  Negative for activity change, appetite change, chills, diaphoresis, fatigue, fever and unexpected weight change.  Respiratory:  Negative for cough and shortness of breath.   Cardiovascular:  Negative for chest pain and leg swelling.  Genitourinary:  Negative for decreased urine volume and difficulty urinating.  Musculoskeletal:  Positive for arthralgias, back pain, myalgias and neck pain. Negative for gait problem, joint swelling and neck stiffness.  Neurological:  Positive for tingling and numbness. Negative for dizziness, tremors, seizures, syncope,  facial asymmetry, speech difficulty, weakness, light-headedness and headaches.  All other systems reviewed and are negative.       Objective:  BP 134/88   Pulse 83   Temp (!) 96.7 F (35.9 C) (Temporal)   Ht '5\' 3"'$  (1.6 m)   Wt 274 lb 9.6 oz (124.6 kg)   SpO2 98%   BMI 48.64 kg/m    Wt Readings from Last 3 Encounters:  12/30/21 274 lb 9.6 oz (124.6 kg)  12/13/21 272 lb (123.4 kg)  08/23/21 277 lb (125.6 kg)    Physical Exam Vitals and nursing note reviewed.  Constitutional:      General: She is not in acute distress.    Appearance: Normal appearance. She is morbidly obese. She is not ill-appearing, toxic-appearing or diaphoretic.  HENT:     Head: Normocephalic and atraumatic.     Mouth/Throat:     Mouth: Mucous membranes are moist.  Eyes:     Pupils: Pupils are equal, round, and reactive to light.  Cardiovascular:  Rate and Rhythm: Normal rate and regular rhythm.     Pulses: Normal pulses.     Heart sounds: Normal heart sounds.  Pulmonary:     Effort: Pulmonary effort is normal.     Breath sounds: Normal breath sounds.  Musculoskeletal:     Right shoulder: Tenderness (muscular) present. No swelling, deformity, effusion, laceration, bony tenderness or crepitus. Normal range of motion. Normal strength. Normal pulse.     Left shoulder: Normal.     Right wrist: Normal.     Left wrist: Normal.     Right hand: No swelling, deformity, lacerations, tenderness or bony tenderness. Normal range of motion. Normal strength. Decreased sensation of the median distribution and radial distribution. Normal sensation of the ulnar distribution. There is no disruption of two-point discrimination. Normal capillary refill. Normal pulse.     Left hand: No swelling, deformity, lacerations, tenderness or bony tenderness. Normal range of motion. Normal strength. Decreased sensation of the median distribution and radial distribution. Normal sensation of the ulnar distribution. There is no  disruption of two-point discrimination. Normal capillary refill. Normal pulse.     Cervical back: Normal range of motion and neck supple. Tenderness present. No swelling, edema, deformity, erythema, signs of trauma, lacerations, rigidity, spasms, torticollis, bony tenderness or crepitus. No pain with movement. Normal range of motion.     Thoracic back: Normal.  Skin:    General: Skin is warm and dry.     Capillary Refill: Capillary refill takes less than 2 seconds.  Neurological:     General: No focal deficit present.     Mental Status: She is alert and oriented to person, place, and time.  Psychiatric:        Mood and Affect: Mood normal.        Behavior: Behavior normal.        Thought Content: Thought content normal.        Judgment: Judgment normal.     Results for orders placed or performed in visit on 08/23/21  Sedimentation rate  Result Value Ref Range   Sed Rate 19 0 - 20 mm/h  C-reactive protein  Result Value Ref Range   CRP 31.8 (H) <8.0 mg/L       Pertinent labs & imaging results that were available during my care of the patient were reviewed by me and considered in my medical decision making.  Assessment & Plan:  Felipa was seen today for hand pain.  Diagnoses and all orders for this visit:  Cervical radiculopathy Paresthesia of hand, bilateral No red flags present. Referral to PT and burst with steroids. Pt aware to report new, worsening, or persistent symptoms. May need MRI and referral to neurosurgery if not improving.  -     Ambulatory referral to Physical Therapy -     predniSONE (STERAPRED UNI-PAK 21 TAB) 10 MG (21) TBPK tablet; As directed x 6 days     Continue all other maintenance medications.  Follow up plan: Return in about 4 weeks (around 01/27/2022), or if symptoms worsen or fail to improve, for radiculopathy .   Continue healthy lifestyle choices, including diet (rich in fruits, vegetables, and lean proteins, and low in salt and simple  carbohydrates) and exercise (at least 30 minutes of moderate physical activity daily).  Educational handout given for cervical radiculopathy  The above assessment and management plan was discussed with the patient. The patient verbalized understanding of and has agreed to the management plan. Patient is aware to call the clinic if  they develop any new symptoms or if symptoms persist or worsen. Patient is aware when to return to the clinic for a follow-up visit. Patient educated on when it is appropriate to go to the emergency department.   Monia Pouch, FNP-C [facility] [phone]

## 2022-01-03 NOTE — Progress Notes (Deleted)
Office Visit Note  Patient: Selena Howard             Date of Birth: [birth year]           MRN: [id number]             PCP: [name], DO Referring: [name], DO Visit Date: 01/10/2022 Occupation: '@GUAROCC'$ @  Subjective:  No chief complaint on file.   History of Present Illness: Selena Howard is a 47 y.o. female ***     Activities of Daily Living:  Patient reports morning stiffness for *** {minute/hour:19697}.   Patient {ACTIONS;DENIES/REPORTS:21021675::"Denies"} nocturnal pain.  Difficulty dressing/grooming: {ACTIONS;DENIES/REPORTS:21021675::"Denies"} Difficulty climbing stairs: {ACTIONS;DENIES/REPORTS:21021675::"Denies"} Difficulty getting out of chair: {ACTIONS;DENIES/REPORTS:21021675::"Denies"} Difficulty using hands for taps, buttons, cutlery, and/or writing: {ACTIONS;DENIES/REPORTS:21021675::"Denies"}  No Rheumatology ROS completed.   PMFS History:  Patient Active Problem List   Diagnosis Date Noted  . Osteoarthritis 12/30/2021  . IBS (irritable bowel syndrome) 06/27/2021  . Obesity, morbid ([place]) 06/27/2021  . Family history of colonic polyps 06/27/2021  . Elevated sedimentation rate 05/17/2021  . RLS (restless legs syndrome) 03/31/2021  . 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  . Vivid dream 03/31/2021  . Snoring 03/31/2021  . Chronic fatigue 03/31/2021  . Excessive daytime sleepiness 03/31/2021  . GERD with apnea 03/31/2021  . Sleep related headaches 03/31/2021  . Body aches 03/30/2021  . Paresthesia 02/04/2021  . Obstructive sleep apnea 02/04/2021  . Vitamin D deficiency 01/28/2021  . History of anxiety 01/28/2021  . Bilateral hand numbness 12/16/2020  . Other intervertebral disc degeneration, lumbar region 12/16/2020    Past Medical History:  Diagnosis Date  . Anxiety   . Arthritis   . Endometriosis    now s/p partial hysterectomy 06/2020  . GERD (gastroesophageal reflux disease)   . Osteoarthritis   . Spinal stenosis  at L4-L5 level     Family History  Problem Relation Age of Onset  . Diabetes Mother   . Liver disease Mother   . COPD Father   . Diabetes Father   . Heart disease Maternal Grandmother   . Cancer Maternal Grandfather   . Cancer Paternal Grandmother   . Diabetes Paternal Grandfather    Past Surgical History:  Procedure Laterality Date  . ABDOMINAL HYSTERECTOMY     partial, ovary sparing  . CESAREAN SECTION    . COLONOSCOPY WITH PROPOFOL N/A 08/17/2021   Procedure: COLONOSCOPY WITH PROPOFOL;  Surgeon: Harvel Quale, MD;  [facility];  Service: Gastroenterology;  Laterality: N/A;  915  . HEMOSTASIS CLIP PLACEMENT  08/17/2021   Procedure: HEMOSTASIS CLIP PLACEMENT;  Surgeon: Harvel Quale, MD;  [facility];  Service: Gastroenterology;;  . POLYPECTOMY  08/17/2021   Procedure: POLYPECTOMY;  Surgeon: Harvel Quale, MD;  [facility];  Service: Gastroenterology;;  . Lia Foyer LIFTING INJECTION  08/17/2021   Procedure: SUBMUCOSAL LIFTING INJECTION;  Surgeon: Harvel Quale, MD;  [facility];  Service: Gastroenterology;;  . Lia Foyer TATTOO INJECTION  08/17/2021   Procedure: SUBMUCOSAL TATTOO INJECTION;  Surgeon: Harvel Quale, MD;  [facility];  Service: Gastroenterology;;  . Arnetha Courser TOOTH EXTRACTION Bilateral    Social History   Social History Narrative   Right handed   Caffeine use: no tea, Coffee-tries to drink decaf, diet sodas   Mother also sees me, Butch Penny Robinette    There is no immunization history on file for this patient.   Objective: Vital Signs: There were no vitals  taken for this visit.   Physical Exam   Musculoskeletal Exam: ***  CDAI Exam: CDAI Score: -- Patient Global: --; Provider Global: -- Swollen: --; Tender: -- Joint Exam 01/10/2022   No joint exam has been documented for this visit   There is currently no information documented on the  homunculus. Go to the Rheumatology activity and complete the homunculus joint exam.  Investigation: No additional findings.  Imaging: DG Cervical Spine Complete  Result Date: 12/13/2021 CLINICAL DATA:  Neck pain EXAM: CERVICAL SPINE - COMPLETE 4+ VIEW COMPARISON:  None Available. FINDINGS: There is no evidence of cervical spine fracture or prevertebral soft tissue swelling. Straightening of the cervical lordosis without listhesis. Intervertebral disc height loss and endplate spurring at the C5-6 level. Mild foraminal crowding at the C5-6 levels bilaterally, predominantly secondary to uncovertebral spurring. Incidentally noted right C7 cervical rib. IMPRESSION: 1. Moderate degenerative disc disease at C5-6 with mild foraminal crowding bilaterally. 2. Incidentally noted right C7 cervical rib. Electronically Signed   By: Davina Poke D.O.   On: 12/13/2021 14:21    Recent Labs: Lab Results  Component Value Date   WBC 5.3 05/17/2021   HGB 12.3 05/17/2021   PLT 259 05/17/2021   NA 138 05/17/2021   K 4.3 05/17/2021   CL 106 05/17/2021   CO2 30 05/17/2021   GLUCOSE 93 05/17/2021   BUN 14 05/17/2021   CREATININE 0.77 05/17/2021   BILITOT 0.6 05/17/2021   ALKPHOS 68 05/17/2021   AST 16 05/17/2021   ALT 17 05/17/2021   PROT 6.6 05/17/2021   ALBUMIN 3.4 (L) 05/17/2021   CALCIUM 8.6 (L) 05/17/2021    Speciality Comments: Discussed viscosupplement injections at the next visit if she has an adequate response to cortisone injection, exercise, physical therapy and NSAID use.  Procedures:  No procedures performed Allergies: Keflex [cephalexin]   Assessment / Plan:     Visit Diagnoses: No diagnosis found.  Orders: No orders of the defined types were placed in this encounter.  No orders of the defined types were placed in this encounter.   Face-to-face time spent with patient was *** minutes. Greater than 50% of time was spent in counseling and coordination of care.  Follow-Up  Instructions: No follow-ups on file.   Earnestine Mealing, CMA  Note - This record has been created using Editor, commissioning.  Chart creation errors have been sought, but may not always  have been located. Such creation errors do not reflect on  the standard of medical care.

## 2022-01-05 ENCOUNTER — Telehealth: Payer: Self-pay | Admitting: Family Medicine

## 2022-01-05 NOTE — Telephone Encounter (Signed)
Mychart message sent with appointment request

## 2022-01-06 NOTE — Telephone Encounter (Signed)
Patient called and does want to see Dr Lajuana Ripple on 1/26 at 4:15 for her 4 wk recheck.

## 2022-01-10 ENCOUNTER — Ambulatory Visit: Payer: Medicaid Other | Admitting: Rheumatology

## 2022-01-10 DIAGNOSIS — Z8719 Personal history of other diseases of the digestive system: Secondary | ICD-10-CM

## 2022-01-10 DIAGNOSIS — R200 Anesthesia of skin: Secondary | ICD-10-CM

## 2022-01-10 DIAGNOSIS — Z8269 Family history of other diseases of the musculoskeletal system and connective tissue: Secondary | ICD-10-CM

## 2022-01-10 DIAGNOSIS — M5136 Other intervertebral disc degeneration, lumbar region: Secondary | ICD-10-CM

## 2022-01-10 DIAGNOSIS — R700 Elevated erythrocyte sedimentation rate: Secondary | ICD-10-CM

## 2022-01-10 DIAGNOSIS — Z87891 Personal history of nicotine dependence: Secondary | ICD-10-CM

## 2022-01-10 DIAGNOSIS — R5383 Other fatigue: Secondary | ICD-10-CM

## 2022-01-10 DIAGNOSIS — Z8659 Personal history of other mental and behavioral disorders: Secondary | ICD-10-CM

## 2022-01-10 DIAGNOSIS — M722 Plantar fascial fibromatosis: Secondary | ICD-10-CM

## 2022-01-10 DIAGNOSIS — B351 Tinea unguium: Secondary | ICD-10-CM

## 2022-01-10 DIAGNOSIS — M797 Fibromyalgia: Secondary | ICD-10-CM

## 2022-01-10 DIAGNOSIS — G4709 Other insomnia: Secondary | ICD-10-CM

## 2022-01-10 DIAGNOSIS — E559 Vitamin D deficiency, unspecified: Secondary | ICD-10-CM

## 2022-01-10 DIAGNOSIS — M19041 Primary osteoarthritis, right hand: Secondary | ICD-10-CM

## 2022-01-10 DIAGNOSIS — M19071 Primary osteoarthritis, right ankle and foot: Secondary | ICD-10-CM

## 2022-01-10 DIAGNOSIS — M170 Bilateral primary osteoarthritis of knee: Secondary | ICD-10-CM

## 2022-01-10 NOTE — Progress Notes (Signed)
Office Visit Note  Patient: Selena Howard             Date of Birth: [birth year]           MRN: [id number]             PCP: [name], DO Referring: [name], DO Visit Date: 01/13/2022 Occupation: '@GUAROCC'$ @  Subjective:  Pain in multiple joints, numbness in hands and feet.  History of Present Illness: Selena Howard is a 47 y.o. female history of last arthritis, degenerative disc disease and fibromyalgia syndrome.  She states she continues to have tingling and numbness in her bilateral hands and her bilateral feet.  She has a stiffness and discomfort in her hands due to underlying osteoarthritis.  She also has pain and discomfort in her knee joints and her feet.  She is chronic lower back pain.  She takes hydrocodone from pain management.  She has generalized pain and discomfort from fibromyalgia.  She states on Cymbalta and Lyrica combination.  She continues to have insomnia and fatigue.  She had been experiencing increased neck pain recently.  She was given a prednisone taper by her PCP.  She also had x-rays of her cervical spine which showed degenerative changes.  She was referred to physical therapy.    Activities of Daily Living:  Patient reports morning stiffness for 30mn to 1 hour    .   Patient Reports nocturnal pain.  Difficulty dressing/grooming: Denies Difficulty climbing stairs: Denies Difficulty getting out of chair: Denies Difficulty using hands for taps, buttons, cutlery, and/or writing: Denies  Review of Systems  Constitutional:  Positive for fatigue.  HENT:  Positive for mouth dryness. Negative for mouth sores.   Eyes:  Positive for dryness.  Respiratory:  Negative for shortness of breath.   Cardiovascular:  Positive for palpitations. Negative for chest pain.  Gastrointestinal:  Positive for constipation. Negative for blood in stool and diarrhea.  Endocrine: Negative for increased urination.  Genitourinary:  Negative for involuntary  urination.  Musculoskeletal:  Positive for joint pain, gait problem, joint pain, joint swelling, myalgias, morning stiffness, muscle tenderness and myalgias. Negative for muscle weakness.  Skin:  Positive for hair loss. Negative for color change, rash and sensitivity to sunlight.  Allergic/Immunologic: Negative for susceptible to infections.  Neurological:  Positive for headaches. Negative for dizziness.  Hematological:  Negative for swollen glands.  Psychiatric/Behavioral:  Positive for sleep disturbance. Negative for depressed mood. The patient is nervous/anxious.     PMFS History:  Patient Active Problem List   Diagnosis Date Noted   Osteoarthritis 12/30/2021   IBS (irritable bowel syndrome) 06/27/2021   [place] 06/27/[place]   Family history of colonic polyps 06/27/2021   Elevated sedimentation rate 05/17/2021   RLS (restless legs syndrome) 03/31/2021   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   Vivid dream 03/31/2021   Snoring 03/31/2021   Chronic fatigue 03/31/2021   Excessive daytime sleepiness 03/31/2021   GERD with apnea 03/31/2021   Sleep related headaches 03/31/2021   Body aches 03/30/2021   Paresthesia 02/04/2021   Obstructive sleep apnea 02/04/2021   Vitamin D deficiency 01/28/2021   History of anxiety 01/28/2021   Bilateral hand numbness 12/16/2020   Other intervertebral disc degeneration, lumbar region 12/16/2020    Past Medical History:  Diagnosis Date   Anxiety    Arthritis    Endometriosis    now s/p partial hysterectomy 06/2020   GERD (gastroesophageal reflux disease)    Osteoarthritis  Spinal stenosis at L4-L5 level     Family History  Problem Relation Age of Onset   Diabetes Mother    Liver disease Mother    COPD Father    Diabetes Father    Heart disease Maternal Grandmother    Cancer Maternal Grandfather    Cancer Paternal Grandmother    Diabetes Paternal Grandfather    Past Surgical History:  Procedure Laterality Date    ABDOMINAL HYSTERECTOMY     partial, ovary sparing   CESAREAN SECTION     COLONOSCOPY WITH PROPOFOL N/A 08/17/2021   Procedure: COLONOSCOPY WITH PROPOFOL;  Surgeon: Harvel Quale, MD;  [facility];  Service: Gastroenterology;  Laterality: N/A;  [place]  08/17/2021   Procedure: HEMOSTASIS CLIP PLACEMENT;  Surgeon: Harvel Quale, MD;  [facility];  Service: Gastroenterology;;   POLYPECTOMY  08/17/2021   Procedure: POLYPECTOMY;  Surgeon: Harvel Quale, MD;  [facility];  Service: Gastroenterology;;   SUBMUCOSAL LIFTING INJECTION  08/17/2021   Procedure: SUBMUCOSAL LIFTING INJECTION;  Surgeon: Montez Morita, Quillian Quince, MD;  [facility];  Service: Gastroenterology;;   SUBMUCOSAL TATTOO INJECTION  08/17/2021   Procedure: SUBMUCOSAL TATTOO INJECTION;  Surgeon: Harvel Quale, MD;  [facility];  Service: Gastroenterology;;   WISDOM TOOTH EXTRACTION Bilateral    Social History   Social History Narrative   Right handed   Caffeine use: no tea, Coffee-tries to drink decaf, diet sodas   Mother also sees me, Butch Penny Robinette    There is no immunization history on file for this patient.   Objective: Vital Signs: BP (!) 145/93 (BP Location: Left Arm, Patient Position: Sitting, Cuff Size: Normal)   Pulse 91   Ht '5\' 3"'$  (1.6 m)   Wt 278 lb (126.1 kg)   BMI 49.25 kg/m    Physical Exam Vitals and nursing note reviewed.  Constitutional:      Appearance: She is well-developed.  HENT:     Head: Normocephalic and atraumatic.  Eyes:     Conjunctiva/sclera: Conjunctivae normal.  Cardiovascular:     Rate and Rhythm: Normal rate and regular rhythm.     Heart sounds: Normal heart sounds.  Pulmonary:     Effort: Pulmonary effort is normal.     Breath sounds: Normal breath sounds.  Abdominal:     General: Bowel sounds are normal.     Palpations: Abdomen is soft.   Musculoskeletal:     Cervical back: Normal range of motion.  Lymphadenopathy:     Cervical: No cervical adenopathy.  Skin:    General: Skin is warm and dry.     Capillary Refill: Capillary refill takes less than 2 seconds.  Neurological:     Mental Status: She is alert and oriented to person, place, and time.  Psychiatric:        Behavior: Behavior normal.      Musculoskeletal Exam: She had limited painful range of motion of the cervical spine.  She had discomfort range of motion lumbar spine.  Shoulder joints, elbow joints, wrist joints, MCPs PIPs and DIPs been good range of motion.  She had bilateral DIP thickening.  Hip joints and knee joints were in good range of motion.  There was no tenderness over ankles or MTPs.  She had generalized hyperalgesia and positive tender points.  CDAI Exam: CDAI Score: -- Patient Global: --; Provider Global: -- Swollen: --; Tender: -- Joint Exam 01/13/2022   No  joint exam has been documented for this visit   There is currently no information documented on the homunculus. Go to the Rheumatology activity and complete the homunculus joint exam.  Investigation: No additional findings.  Imaging: No results found.  Recent Labs: Lab Results  Component Value Date   WBC 5.3 05/17/2021   HGB 12.3 05/17/2021   PLT 259 05/17/2021   NA 138 05/17/2021   K 4.3 05/17/2021   CL 106 05/17/2021   CO2 30 05/17/2021   GLUCOSE 93 05/17/2021   BUN 14 05/17/2021   CREATININE 0.77 05/17/2021   BILITOT 0.6 05/17/2021   ALKPHOS 68 05/17/2021   AST 16 05/17/2021   ALT 17 05/17/2021   PROT 6.6 05/17/2021   ALBUMIN 3.4 (L) 05/17/2021   CALCIUM 8.6 (L) 05/17/2021    Speciality Comments: Discussed viscosupplement injections at the next visit if she has an adequate response to cortisone injection, exercise, physical therapy and NSAID use.  Procedures:  No procedures performed Allergies: Keflex [cephalexin]   Assessment / Plan:     Visit Diagnoses:  Primary osteoarthritis of both hands -patient complains of pain and discomfort in her bilateral hands.  No synovitis was noted.  Clinical and radiographic findings are consistent with osteoarthritis.  X-rays of both hands on 01/28/2021 were consistent with early osteoarthritic changes.  Ultrasound of her bilateral hands obtained on September 29, 2021 did not show any synovitis.  Advised patient to contact us if she develops any swelling.  Bilateral hand numbness - Patient was referred to neurology.  Patient was evaluated by Dr. Krista Blue.  Nerve conduction velocity with EMG results were reviewed from March 30, 2021 which were within normal limits.  Patient continues to have numbness in her hands.  She recently had cervical spine x-rays which showed degenerative changes.  She will be going for physical therapy.  I advised her to schedule a follow-up with visit with the neurologist if she has persistent symptoms.  Primary osteoarthritis of both knees -she complains of pain and discomfort in the bilateral knee joints.  She has known history of bilateral moderate osteoarthritis and moderate chondromalacia patella was noted.  Lower extremity muscle strengthening exercises were discussed.  Primary osteoarthritis of both feet -she complains of pain and discomfort in her bilateral feet and also numbness in her feet.  Clinical and radiographic findings are consistent with osteoarthritis.  No synovitis was noted on the examination.  Plantar fasciitis, bilateral - referred to podiatry at the last visit.  Numbness in feet -she continues to have numbness in her feet.  Neurology workup was negative.  She was advised to schedule a follow-up appointment if needed.  DDD (degenerative disc disease), lumbar - History of degenerative disc disease and spinal stenosis per patient.  Chronic pain.  Followed by pain management.  Taking hydrocodone as needed.  Elevated sed rate -August 23, 2021 sed rate was normal at 19.  CRP stays  elevated.  Fibromyalgia-she has generalized pain and discomfort from fibromyalgia.  Benefits from water aerobics, swimming and stretching exercises were discussed.  Other insomnia-she gives history of chronic insomnia.  Good sleep hygiene was discussed.  Other fatigue-she has fatigue related to fibromyalgia and insomnia.  Other medical problems listed as follows:  History of gastroesophageal reflux (GERD)  Vitamin D deficiency  History of anxiety  Onychomycosis  Former smoker - Half a pack per day for 15 years.  She quit smoking in May 2022.  Family history of fibromyalgia  Orders: No orders of the defined types  were placed in this encounter.  No orders of the defined types were placed in this encounter.    Follow-Up Instructions: Return in about 1 year (around 01/14/2023) for Osteoarthritis.   Bo Merino, MD  Note - This record has been created using Editor, commissioning.  Chart creation errors have been sought, but may not always  have been located. Such creation errors do not reflect on  the standard of medical care.

## 2022-01-12 ENCOUNTER — Ambulatory Visit (HOSPITAL_COMMUNITY): Payer: Medicaid Other | Attending: Family Medicine | Admitting: Physical Therapy

## 2022-01-12 ENCOUNTER — Other Ambulatory Visit: Payer: Self-pay

## 2022-01-12 DIAGNOSIS — M5459 Other low back pain: Secondary | ICD-10-CM

## 2022-01-12 DIAGNOSIS — M545 Low back pain, unspecified: Secondary | ICD-10-CM | POA: Insufficient documentation

## 2022-01-12 DIAGNOSIS — M797 Fibromyalgia: Secondary | ICD-10-CM | POA: Diagnosis present

## 2022-01-12 DIAGNOSIS — R202 Paresthesia of skin: Secondary | ICD-10-CM | POA: Diagnosis not present

## 2022-01-12 DIAGNOSIS — M5412 Radiculopathy, cervical region: Secondary | ICD-10-CM | POA: Insufficient documentation

## 2022-01-12 DIAGNOSIS — M6281 Muscle weakness (generalized): Secondary | ICD-10-CM | POA: Insufficient documentation

## 2022-01-12 DIAGNOSIS — G8929 Other chronic pain: Secondary | ICD-10-CM | POA: Diagnosis present

## 2022-01-12 NOTE — Therapy (Signed)
OUTPATIENT PHYSICAL THERAPY THORACOLUMBAR EVALUATION   Patient Name: Selena Howard MRN: [id number] DOB:06-Jun-1975, 47 y.o., female Today's Date: 01/12/2022  END OF SESSION:  PT End of Session - 01/12/22 1410     Visit Number 1    Number of Visits 12    Date for PT Re-Evaluation 02/23/22    Authorization Type Medicaid [employer]    PT Start Time 0905    PT Stop Time 0950    PT Time Calculation (min) 45 min    Activity Tolerance Patient tolerated treatment well    Behavior During Therapy [facility] for tasks assessed/performed             Past Medical History:  Diagnosis Date   Anxiety    Arthritis    Endometriosis    now s/p partial hysterectomy 06/2020   GERD (gastroesophageal reflux disease)    Osteoarthritis    Spinal stenosis at L4-L5 level    Past Surgical History:  Procedure Laterality Date   ABDOMINAL HYSTERECTOMY     partial, ovary sparing   CESAREAN SECTION     COLONOSCOPY WITH PROPOFOL N/A 08/17/2021   Procedure: COLONOSCOPY WITH PROPOFOL;  Surgeon: Harvel Quale, MD;  [facility];  Service: Gastroenterology;  Laterality: N/A;  [place]  08/17/2021   Procedure: HEMOSTASIS CLIP PLACEMENT;  Surgeon: Harvel Quale, MD;  [facility];  Service: Gastroenterology;;   POLYPECTOMY  08/17/2021   Procedure: POLYPECTOMY;  Surgeon: Harvel Quale, MD;  [facility];  Service: Gastroenterology;;   SUBMUCOSAL LIFTING INJECTION  08/17/2021   Procedure: SUBMUCOSAL LIFTING INJECTION;  Surgeon: Harvel Quale, MD;  [facility];  Service: Gastroenterology;;   SUBMUCOSAL TATTOO INJECTION  08/17/2021   Procedure: SUBMUCOSAL TATTOO INJECTION;  Surgeon: Harvel Quale, MD;  [facility];  Service: Gastroenterology;;   [place] EXTRACTION Bilateral    Patient Active Problem List   Diagnosis Date Noted   Osteoarthritis 12/30/2021   IBS (irritable  bowel syndrome) 06/27/2021   Obesity, morbid ([place]) 06/27/2021   Family history of colonic polyps 06/27/2021   Elevated sedimentation rate 05/17/2021   RLS (restless legs syndrome) 03/31/2021   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   Vivid dream 03/31/2021   Snoring 03/31/2021   Chronic fatigue 03/31/2021   Excessive daytime sleepiness 03/31/2021   GERD with apnea 03/31/2021   Sleep related headaches 03/31/2021   Body aches 03/30/2021   Paresthesia 02/04/2021   Obstructive sleep apnea 02/04/2021   Vitamin D deficiency 01/28/2021   History of anxiety 01/28/2021   Bilateral hand numbness 12/16/2020   Other intervertebral disc degeneration, lumbar region 12/16/2020    PCP: Adam Phenix  REFERRING PROVIDER: Nuala Alpha, MD  REFERRING DIAG:  Diagnosis Description  pain in lumbar spine    Rationale for Evaluation and Treatment: Rehabilitation  THERAPY DIAG:  Low back pain Mm weakness  ONSET DATE: Chronic   SUBJECTIVE:  SUBJECTIVE STATEMENT: PT states that she has been having pain for years. The pt states that her pain is getting worse.  She is in pain management for her back.  She states that it really hurts to do sweep or complete dishes or anything.  She can sit without pain,  She stand for 15 minutes before she feels like she needs to lean over, She can walk for 10 - 15 minutes.  Pain is greater on the RT side and can go down to her feet.  She has numbing in her feet and toes as well as her hands.   PERTINENT HISTORY:  See above   PAIN:  Are you having pain? Yes: NPRS scale: 7/10 Pain location: low back  Pain description: sharp and aching  Aggravating factors: activity  Relieving factors: rest   PRECAUTIONS: None  WEIGHT BEARING RESTRICTIONS: No  FALLS:  Has patient  fallen in last 6 months? No  LIVING ENVIRONMENT: Lives with: lives with their family Lives in: House/apartment Stairs: no Has following equipment at home: None  OCCUPATION: none  PLOF: Independent  PATIENT GOALS: Less pain, to be able to move better   NEXT MD VISIT:   OBJECTIVE:   DIAGNOSTIC FINDINGS:  None available   COGNITION: Overall cognitive status: Within functional limits for tasks assessed      POSTURE: increased lumbar lordosis and decreased thoracic kyphosis  LUMBAR ROM:   AROM eval  Flexion Fingers to mid shin area reps no increased pain  Extension 20;  reps slightly increase pain   Right lateral flexion   Left lateral flexion   Right rotation   Left rotation    (Blank rows = not tested)   LOWER EXTREMITY MMT:    MMT Right eval Left eval  Hip flexion 4+/5 5/5  Hip extension 3/5 3/5  Hip abduction 4+/5  4+/5  Hip adduction    Hip internal rotation    Hip external rotation    Knee flexion 4/5 5/5  Knee extension 4-/5 4+/5   Ankle dorsiflexion 5/5 5/5  Ankle plantarflexion    Ankle inversion    Ankle eversion     (Blank rows = not tested)   FUNCTIONAL TESTS:  30 seconds chair stand test:  4x  14 is poor for pt this age  32 minute walk test: 382 ft.  Single leg stance: LT 3"; RT:  5"   TODAY'S TREATMENT:                                                                                                                              DATE: 01/12/22 Supine: Abdominal set x5 Bridge x5 Pelvic tilt x5    PATIENT EDUCATION:  Education details: HEP Person educated: Patient Education method: Explanation Education comprehension: verbalized understanding  HOME EXERCISE PROGRAM: Access Code: [email or web address] URL: [email or web address] Date: 01/12/2022 Prepared by: Rayetta Humphrey  Exercises - Supine Bridge  - 1 x daily - 7 x weekly - 1  sets - 10 reps - 5 hold - Supine Posterior Pelvic Tilt  - 1 x daily - 7 x weekly - 1 sets - 10 reps -  5 hold - Supine Anterior Pelvic Tilt  - 1 x daily - 7 x weekly - 1 sets - 10 reps - 5 hold  ASSESSMENT:  CLINICAL IMPRESSION: Patient is a 47 y.o. female who was seen today for physical therapy evaluation and treatment for low back pain. Evaluation demonstrates postural dysfunction, decreased strength, decreased balance, decreased ROM and increased pain resulting in decreased functional ability.  Selena Howard will benefit from skilled PT to address these issues and maximize her functional ability   OBJECTIVE IMPAIRMENTS: decreased activity tolerance, decreased balance, decreased ROM, decreased strength, postural dysfunction, obesity, and pain.   ACTIVITY LIMITATIONS: carrying, lifting, bending, sitting, standing, and locomotion level  PARTICIPATION LIMITATIONS: cleaning, laundry, shopping, and community activity  PERSONAL FACTORS: Fitness, Past/current experiences, and Time since onset of injury/illness/exacerbation are also affecting patient's functional outcome.   REHAB POTENTIAL: Good  CLINICAL DECISION MAKING: Stable/uncomplicated  EVALUATION COMPLEXITY: Moderate   GOALS: Goals reviewed with patient? No  SHORT TERM GOALS: Target date: 2/13.24  Pt to be I in HEP in order to decrease her pain in her back to no greater than a 5/10 Baseline: Goal status: INITIAL  2.  Pt core and LE strength to be increased 1/2 grade to allow pt to be able to stand for 30 minutes for meal prep.  Baseline:  Goal status: INITIAL  3.  Pt to be able to walk for 30 minutes to be able to shop  Baseline:  Goal status: INITIAL  LONG TERM GOALS: Target date: 03/08/22  Pt to be I in HEP in order to decrease her pain in her back to no greater than a 3/10 Baseline:  Goal status: INITIAL  2.  Pt core and LE strength to be increased 1/2 grade to allow pt to be able to stand for 45 minutes for meal prep Baseline:  Goal status: INITIAL  3.  Pt to be able to walk for 30 minutes to be able to shop   Baseline:  Goal status: INITIAL  4.  PT to be able to single leg stance on both Le for at least 10" to feel more stable walking on uneven ground  Baseline:  Goal status: INITIAL   PT FREQUENCY: 2x/week  PT DURATION: 6 weeks  PLANNED INTERVENTIONS: Therapeutic exercises, Therapeutic activity, Balance training, Patient/Family education, Self Care, and Manual therapy.  PLAN FOR NEXT SESSION: begin lumbar stabilization exercises.  Rayetta Humphrey, [place] CLT [phone]  01/12/2022, 2:13 PM

## 2022-01-13 ENCOUNTER — Encounter: Payer: Self-pay | Admitting: Rheumatology

## 2022-01-13 ENCOUNTER — Ambulatory Visit: Payer: Medicaid Other | Attending: Rheumatology | Admitting: Rheumatology

## 2022-01-13 VITALS — BP 145/93 | HR 91 | Ht 63.0 in | Wt 278.0 lb

## 2022-01-13 DIAGNOSIS — M722 Plantar fascial fibromatosis: Secondary | ICD-10-CM

## 2022-01-13 DIAGNOSIS — M19071 Primary osteoarthritis, right ankle and foot: Secondary | ICD-10-CM | POA: Diagnosis not present

## 2022-01-13 DIAGNOSIS — M19042 Primary osteoarthritis, left hand: Secondary | ICD-10-CM

## 2022-01-13 DIAGNOSIS — E559 Vitamin D deficiency, unspecified: Secondary | ICD-10-CM

## 2022-01-13 DIAGNOSIS — M170 Bilateral primary osteoarthritis of knee: Secondary | ICD-10-CM | POA: Diagnosis not present

## 2022-01-13 DIAGNOSIS — M19072 Primary osteoarthritis, left ankle and foot: Secondary | ICD-10-CM

## 2022-01-13 DIAGNOSIS — Z87891 Personal history of nicotine dependence: Secondary | ICD-10-CM

## 2022-01-13 DIAGNOSIS — Z8269 Family history of other diseases of the musculoskeletal system and connective tissue: Secondary | ICD-10-CM

## 2022-01-13 DIAGNOSIS — B351 Tinea unguium: Secondary | ICD-10-CM

## 2022-01-13 DIAGNOSIS — R700 Elevated erythrocyte sedimentation rate: Secondary | ICD-10-CM

## 2022-01-13 DIAGNOSIS — R200 Anesthesia of skin: Secondary | ICD-10-CM

## 2022-01-13 DIAGNOSIS — M5136 Other intervertebral disc degeneration, lumbar region: Secondary | ICD-10-CM

## 2022-01-13 DIAGNOSIS — M5030 Other cervical disc degeneration, unspecified cervical region: Secondary | ICD-10-CM

## 2022-01-13 DIAGNOSIS — M51369 Other intervertebral disc degeneration, lumbar region without mention of lumbar back pain or lower extremity pain: Secondary | ICD-10-CM

## 2022-01-13 DIAGNOSIS — M797 Fibromyalgia: Secondary | ICD-10-CM

## 2022-01-13 DIAGNOSIS — G4709 Other insomnia: Secondary | ICD-10-CM

## 2022-01-13 DIAGNOSIS — M19041 Primary osteoarthritis, right hand: Secondary | ICD-10-CM

## 2022-01-13 DIAGNOSIS — Z8659 Personal history of other mental and behavioral disorders: Secondary | ICD-10-CM

## 2022-01-13 DIAGNOSIS — Z8719 Personal history of other diseases of the digestive system: Secondary | ICD-10-CM

## 2022-01-13 DIAGNOSIS — R5383 Other fatigue: Secondary | ICD-10-CM

## 2022-01-17 ENCOUNTER — Encounter (INDEPENDENT_AMBULATORY_CARE_PROVIDER_SITE_OTHER): Payer: Self-pay | Admitting: *Deleted

## 2022-01-18 ENCOUNTER — Other Ambulatory Visit: Payer: Self-pay | Admitting: Family Medicine

## 2022-01-18 DIAGNOSIS — K219 Gastro-esophageal reflux disease without esophagitis: Secondary | ICD-10-CM

## 2022-01-24 ENCOUNTER — Ambulatory Visit (HOSPITAL_COMMUNITY): Payer: Medicaid Other | Admitting: Physical Therapy

## 2022-01-24 DIAGNOSIS — M5459 Other low back pain: Secondary | ICD-10-CM

## 2022-01-24 DIAGNOSIS — M545 Low back pain, unspecified: Secondary | ICD-10-CM

## 2022-01-24 DIAGNOSIS — M6281 Muscle weakness (generalized): Secondary | ICD-10-CM

## 2022-01-24 DIAGNOSIS — M797 Fibromyalgia: Secondary | ICD-10-CM

## 2022-01-24 NOTE — Therapy (Signed)
OUTPATIENT PHYSICAL THERAPY THORACOLUMBAR Treatment   Patient Name: Selena Howard MRN: [id number] DOB:Jul 10, 1975, 47 y.o., female Today's Date: 01/24/2022  END OF SESSION:  PT End of Session - 01/24/22 1430    Visit Number 2    Number of Visits 12    Date for PT Re-Evaluation 02/23/22    Authorization Type Medicaid [employer]    Authorization Time Period 4 visits approved from 1/23 -2/21    Authorization - Visit Number 1    Authorization - Number of Visits 4    PT Start Time 1350    PT Stop Time 1430    PT Time Calculation (min) 40 min    Activity Tolerance Patient tolerated treatment well    Behavior During Therapy [facility] for tasks assessed/performed             Past Medical History:  Diagnosis Date   Anxiety    Arthritis    Endometriosis    now s/p partial hysterectomy 06/2020   GERD (gastroesophageal reflux disease)    Osteoarthritis    Spinal stenosis at L4-L5 level    Past Surgical History:  Procedure Laterality Date   ABDOMINAL HYSTERECTOMY     partial, ovary sparing   CESAREAN SECTION     COLONOSCOPY WITH PROPOFOL N/A 08/17/2021   Procedure: COLONOSCOPY WITH PROPOFOL;  Surgeon: Harvel Quale, MD;  [facility];  Service: Gastroenterology;  Laterality: N/A;  [place]  08/17/2021   Procedure: HEMOSTASIS CLIP PLACEMENT;  Surgeon: Harvel Quale, MD;  Location[facility];  Service: Gastroenterology;;   POLYPECTOMY  08/17/2021   Procedure: POLYPECTOMY;  Surgeon: Harvel Quale, MD;  [facility];  Service: Gastroenterology;;   SUBMUCOSAL LIFTING INJECTION  08/17/2021   Procedure: SUBMUCOSAL LIFTING INJECTION;  Surgeon: Harvel Quale, MD;  [facility];  Service: Gastroenterology;;   SUBMUCOSAL TATTOO INJECTION  08/17/2021   Procedure: SUBMUCOSAL TATTOO INJECTION;  Surgeon: Harvel Quale, MD;  [facility];  Service: Gastroenterology;;    [place] EXTRACTION Bilateral    Patient Active Problem List   Diagnosis Date Noted   Osteoarthritis 12/30/2021   IBS (irritable bowel syndrome) 06/27/2021   Obesity, morbid ([place]) 06/27/2021   Family history of colonic polyps 06/27/2021   Elevated sedimentation rate 05/17/2021   RLS (restless legs syndrome) 03/31/2021   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   Vivid dream 03/31/2021   Snoring 03/31/2021   Chronic fatigue 03/31/2021   Excessive daytime sleepiness 03/31/2021   GERD with apnea 03/31/2021   Sleep related headaches 03/31/2021   Body aches 03/30/2021   Paresthesia 02/04/2021   Obstructive sleep apnea 02/04/2021   Vitamin D deficiency 01/28/2021   History of anxiety 01/28/2021   Bilateral hand numbness 12/16/2020   Other intervertebral disc degeneration, lumbar region 12/16/2020    PCP: Adam Phenix  REFERRING PROVIDER: Nuala Alpha, MD  REFERRING DIAG:  Diagnosis Description  pain in lumbar spine    Rationale for Evaluation and Treatment: Rehabilitation  THERAPY DIAG:  Low back pain Mm weakness  ONSET DATE: Chronic  SUBJECTIVE STATEMENT: Pt is having low back pain that radiates to the right.  She has been doing her exercises. PERTINENT HISTORY:  See above   PAIN:  Are you having pain? Yes: NPRS scale: 9/10 Pain location: low back  Pain description: sharp and aching  Aggravating factors: activity  Relieving factors: rest   PRECAUTIONS: None  WEIGHT BEARING RESTRICTIONS: No  FALLS:  Has patient fallen in last 6 months? No  LIVING ENVIRONMENT: Lives with: lives with their family Lives in: House/apartment Stairs: no Has following equipment at home: None  OCCUPATION: none  PLOF: Independent  PATIENT GOALS: Less pain, to be able to move better    NEXT MD VISIT: unknown   OBJECTIVE:   DIAGNOSTIC FINDINGS:  None available   COGNITION: Overall cognitive status: Within functional limits for tasks assessed      POSTURE: increased lumbar lordosis and decreased thoracic kyphosis  LUMBAR ROM:   AROM eval  Flexion Fingers to mid shin area reps no increased pain  Extension 20;  reps slightly increase pain   Right lateral flexion   Left lateral flexion   Right rotation   Left rotation    (Blank rows = not tested)   LOWER EXTREMITY MMT:    MMT Right eval Left eval  Hip flexion 4+/5 5/5  Hip extension 3/5 3/5  Hip abduction 4+/5  4+/5  Hip adduction    Hip internal rotation    Hip external rotation    Knee flexion 4/5 5/5  Knee extension 4-/5 4+/5   Ankle dorsiflexion 5/5 5/5  Ankle plantarflexion    Ankle inversion    Ankle eversion     (Blank rows = not tested)   FUNCTIONAL TESTS:  30 seconds chair stand test:  4x  14 is poor for pt this age  48 minute walk test: 382 ft.  Single leg stance: LT 3"; RT:  5"   TODAY'S TREATMENT:                                                                                                                              DATE:  01/24/22: Standing:  Theraband exercises  Scapular retraction x 10 Rows x 10 Shoulder extension x 10 Heel raises x 10 Functional squat x 10 Standing against a wall with pelvic tilt x 10 All 4's  Mad cat x 4 Child pose x 3  Supine: Pelvic tilt x 10 Bridge x 15 Marching x 10  Clam x 5 Double knee to chest x 5   01/12/22 Supine: Abdominal set x5 Bridge x5 Pelvic tilt x5    PATIENT EDUCATION:  Education details: HEP Person educated: Patient Education method: Explanation Education comprehension: verbalized understanding  HOME EXERCISE PROGRAM:            01/24/22:             Theraband postural exercises with green theraband  Scapular retraction, rows and shoulder extension  Access Code: [email or web address] URL:  [email or web address] Date: 01/12/2022 Prepared by: Rayetta Humphrey  Exercises - Supine Bridge  - 1 x daily - 7 x weekly - 1 sets - 10 reps - 5 hold - Supine Posterior Pelvic Tilt  - 1 x daily - 7 x weekly - 1 sets - 10 reps - 5 hold - Supine Anterior Pelvic Tilt  - 1 x daily - 7 x weekly - 1 sets - 10 reps - 5 hold  ASSESSMENT:  CLINICAL IMPRESSION:  Reviewed evaluation and goals with pt.  Progressed pt by adding postural exercises with theraband , these were given as a HEP. Therapist then concentrated on exercises which would de-emphasize pt lordotic curve.    Selena Howard will benefit from skilled PT to address these issues and maximize her functional ability   OBJECTIVE IMPAIRMENTS: decreased activity tolerance, decreased balance, decreased ROM, decreased strength, postural dysfunction, obesity, and pain.   ACTIVITY LIMITATIONS: carrying, lifting, bending, sitting, standing, and locomotion level  PARTICIPATION LIMITATIONS: cleaning, laundry, shopping, and community activity  PERSONAL FACTORS: Fitness, Past/current experiences, and Time since onset of injury/illness/exacerbation are also affecting patient's functional outcome.   REHAB POTENTIAL: Good  CLINICAL DECISION MAKING: Stable/uncomplicated  EVALUATION COMPLEXITY: Moderate   GOALS: th patient? No  SHORT TERM GOALS: Target date: 2/13.24  Pt to be I in HEP in order to decrease her pain in her back to no greater than a 5/10 Baseline: Goal status: IN PROGRESS  2.  Pt core and LE strength to be increased 1/2 grade to allow pt to be able to stand for 30 minutes for meal prep.  Baseline:  Goal status: IN PROGRESS  3.  Pt to be able to walk for 30 minutes to be able to shop  Baseline:  Goal status: IN PROGRESS  LONG TERM GOALS: Target date: 03/08/22  Pt to be I in HEP in order to decrease her pain in her back to no greater than a 3/10 Baseline:  Goal status: IN PROGRESS  2.  Pt core and LE strength to  be increased 1/2 grade to allow pt to be able to stand for 45 minutes for meal prep Baseline:  Goal status: IN PROGRESS  3.  Pt to be able to walk for 30 minutes to be able to shop  Baseline:  Goal status: IN PROGRESS  4.  PT to be able to single leg stance on both Le for at least 10" to feel more stable walking on uneven ground  Baseline:  Goal status: IN PROGRESS   PT FREQUENCY: 2x/week  PT DURATION: 6 weeks  PLANNED INTERVENTIONS: Therapeutic exercises, Therapeutic activity, Balance training, Patient/Family education, Self Care, and Manual therapy.  PLAN FOR NEXT SESSION: continue lumbar stabilization exercises.  Rayetta Humphrey, [place] CLT [phone]  01/24/2022 1431

## 2022-01-25 ENCOUNTER — Ambulatory Visit: Payer: Medicaid Other | Admitting: Obstetrics & Gynecology

## 2022-01-25 ENCOUNTER — Encounter: Payer: Self-pay | Admitting: Obstetrics & Gynecology

## 2022-01-25 VITALS — BP 126/81 | HR 91 | Ht 63.0 in | Wt 278.2 lb

## 2022-01-25 DIAGNOSIS — N959 Unspecified menopausal and perimenopausal disorder: Secondary | ICD-10-CM

## 2022-01-25 NOTE — Progress Notes (Signed)
   GYN VISIT Patient name: Selena Howard MRN [id number]  Date of birth: [birth year] Chief Complaint:   night sweats, bloated, weight gain, hot flashes, facial flu  History of Present Illness:   Selena Howard is a 47 y.o. [id number] PH female being seen today for the following concerns:  -Vasomotor symptoms: Notes that with small activities, but will note increased sweating and hot flushes.  Every now and then again starting to note night sweats and difficulty with sleeping.   Also notes some mood changes and increased irritability.  Still continues to struggle with weight loss.   No LMP recorded. Patient has had a hysterectomy.     12/30/2021   11:00 AM 12/13/2021    1:41 PM 07/25/2021   11:21 AM 06/30/2021    8:15 AM 06/21/2021    2:08 PM  Depression screen PHQ 2/9  Decreased Interest 0 0 0 1 0  Down, Depressed, Hopeless 0 0 0 0 0  PHQ - 2 Score 0 0 0 1 0  Altered sleeping '2 1 1 2 3  '$ Tired, decreased energy '1 1 1 1 1  '$ Change in appetite 0 1 1 0 1  Feeling bad or failure about yourself  0 0 0 0 0  Trouble concentrating '1  1 1 1  '$ Moving slowly or fidgety/restless 0 0 0 1 1  Suicidal thoughts 0 0 0 0 0  PHQ-9 Score '4 3 4 6 7  '$ Difficult doing work/chores Not difficult at all Somewhat difficult Somewhat difficult Somewhat difficult Somewhat difficult     Review of Systems:   Pertinent items are noted in HPI Denies fever/chills, dizziness, headaches, visual disturbances, fatigue, shortness of breath, chest pain, abdominal pain, vomiting, no problems with  bowel movements, urination, or intercourse unless otherwise stated above.  Pertinent History Reviewed:  Reviewed past medical,surgical, social, obstetrical and family history.  Reviewed problem list, medications and allergies. Physical Assessment:   Vitals:   01/25/22 1131  BP: 126/81  Pulse: 91  Weight: 278 lb 3.2 oz (126.2 kg)  Height: '5\' 3"'$  (1.6 m)  Body mass index is 49.28 kg/m.       Physical Examination:   General  appearance: alert, well appearing, and in no distress  Psych: mood appropriate, normal affect  Skin: warm & dry   Cardiovascular: normal heart rate noted  Respiratory: normal respiratory effort, no distress  Extremities: no edema   Chaperone: N/A    Assessment & Plan:  1) Menopausal disorder -reassured pt that symptoms are likely due to hormonal changes -discussed herbal supplements remedies including Magnesium and others -discussed HRT- reviewed risk/benefit, WHI study and "lowest dose for shortest time frame" Pt denies history of any of the following:  Untreated hypertension. Active liver disease with abnormal liver function tests. Active or recent arterial thromboembolic disease. Previous or current venous thromboembolism  -also reviewed [place]  For now trial of herbal supplements, if no improvement in 2-3 mos pt to call back- will plan on starting low dose estrogen  2) Morbid obesity -discussed change in metabolism with menopause -advised pt see weight loss specialist   No orders of the defined types were placed in this encounter.   Return in 3 months (on 04/26/2022), or if symptoms worsen or fail to improve.   Janyth Pupa, DO Attending [place], [facility] for [employer], [place]

## 2022-01-26 ENCOUNTER — Encounter (HOSPITAL_COMMUNITY): Payer: Medicaid Other | Admitting: Physical Therapy

## 2022-01-27 ENCOUNTER — Ambulatory Visit: Payer: Medicaid Other | Admitting: Family Medicine

## 2022-01-27 ENCOUNTER — Encounter: Payer: Self-pay | Admitting: Family Medicine

## 2022-01-27 DIAGNOSIS — I10 Essential (primary) hypertension: Secondary | ICD-10-CM | POA: Diagnosis not present

## 2022-01-27 DIAGNOSIS — R3915 Urgency of urination: Secondary | ICD-10-CM

## 2022-01-27 DIAGNOSIS — R635 Abnormal weight gain: Secondary | ICD-10-CM

## 2022-01-27 DIAGNOSIS — R5382 Chronic fatigue, unspecified: Secondary | ICD-10-CM

## 2022-01-27 DIAGNOSIS — R600 Localized edema: Secondary | ICD-10-CM | POA: Diagnosis not present

## 2022-01-27 LAB — URINALYSIS, ROUTINE W REFLEX MICROSCOPIC
Bilirubin, UA: NEGATIVE
Glucose, UA: NEGATIVE
Leukocytes,UA: NEGATIVE
Nitrite, UA: NEGATIVE
RBC, UA: NEGATIVE
Specific Gravity, UA: >1.03 — ABNORMAL HIGH (ref 1.005–1.030)
Urobilinogen, Ur: 0.2 mg/dL (ref 0.2–1.0)
pH, UA: 5 (ref 5.0–7.5)

## 2022-01-27 MED ORDER — HYDROCHLOROTHIAZIDE 12.5 MG PO CAPS: 12.5000 mg | ORAL_CAPSULE | Freq: Every day | ORAL | 3 refills | Status: DC

## 2022-01-27 NOTE — Progress Notes (Signed)
Subjective: [name] up HTN PCP: Selena Norlander, DO TGG:[name] Selena Howard is a 47 y.o. female presenting to clinic today for:  1. HTN associated with morbid obesity Not treated with with meds.  No CP, SOB.  She reports weight gain after hysterectomy.  Reports fatigue, chronic pain.  On meds through pain management.  Reports edema of legs.   ROS: Per HPI  Allergies  Allergen Reactions   Keflex [Cephalexin] Nausea And Vomiting   Past Medical History:  Diagnosis Date   Anxiety    Arthritis    Endometriosis    now s/p partial hysterectomy 06/2020   GERD (gastroesophageal reflux disease)    Osteoarthritis    Spinal stenosis at L4-L5 level     Current Outpatient Medications:    betamethasone valerate (VALISONE) 0.1 % cream, Apply 1 Application topically daily as needed (foor rash)., Disp: , Rfl:    DULoxetine (CYMBALTA) 60 MG capsule, Take 60 mg by mouth daily., Disp: , Rfl:    esomeprazole (NEXIUM) 40 MG capsule, Take 1 capsule (40 mg total) by mouth daily., Disp: 30 capsule, Rfl: 0   fluticasone (FLONASE) 50 MCG/ACT nasal spray, Place 2 sprays into both nostrils daily., Disp: 16 g, Rfl: 2   HYDROcodone-acetaminophen (NORCO) 7.5-325 MG tablet, Take 1 tablet by mouth every 6 (six) hours as needed for moderate pain., Disp: , Rfl:    meloxicam (MOBIC) 15 MG tablet, Take 1 tablet (15 mg total) by mouth daily., Disp: 30 tablet, Rfl: 0   pregabalin (LYRICA) 75 MG capsule, Take 1 capsule (75 mg total) by mouth 2 (two) times daily., Disp: 60 capsule, Rfl: 5 Social History   Socioeconomic History   Marital status: Divorced    Spouse name: Not on file   Number of children: 3   Years of education: 12   Highest education level: High school graduate  Occupational History   Not on file  Tobacco Use   Smoking status: Former    Packs/day: 0.50    Years: 10.00    Total pack years: 5.00    Types: Cigarettes    Quit date: 06/02/2020    Years since quitting: 1.6    Passive exposure:  Past   Smokeless tobacco: Never  Vaping Use   Vaping Use: Never used  Substance and Sexual Activity   Alcohol use: Not Currently   Drug use: Not Currently   Sexual activity: Not Currently    Birth control/protection: Surgical  Other Topics Concern   Not on file  Social History Narrative   Right handed   Caffeine use: no tea, Coffee-tries to drink decaf, diet sodas   Mother also sees me, Pensions consultant   Social Determinants of Health   Financial Resource Strain: Not on file  Food Insecurity: Not on file  Transportation Needs: Not on file  Physical Activity: Not on file  Stress: Not on file  Social Connections: Not on file  Intimate Partner Violence: Not on file   Family History  Problem Relation Age of Onset   Diabetes Mother    Liver disease Mother    COPD Father    Diabetes Father    Heart disease Maternal Grandmother    Cancer Maternal Grandfather    Cancer Paternal Grandmother    Diabetes Paternal Grandfather     Objective: Office vital signs reviewed. BP (!) 144/87   Pulse 99   Temp 98.7 F (37.1 C)   Ht '5\' 3"'$  (1.6 m)   Wt 278 lb 6.4 oz (126.3  kg)   SpO2 94%   BMI 49.32 kg/m   Physical Examination:  General: Awake, alert, well nourished, No acute distress HEENT: sclera white, MMM. No exophthalmos Cardio: regular rate and rhythm, S1S2 heard, no murmurs appreciated Pulm: clear to auscultation bilaterally, no wheezes, rhonchi or rales; normal work of breathing on room air Extremities: warm, well perfused, trace leg edema, No cyanosis or clubbing; +2 pulses bilaterally    Assessment/ Plan: 47 y.o. female   Morbid obesity ([place]) - Plan: TSH, T4, Free, CMP14+EGFR, Bayer DCA Hb A1c Waived  Primary hypertension - Plan: hydrochlorothiazide (MICROZIDE) 12.5 MG capsule, CMP14+EGFR  Bilateral leg edema - Plan: hydrochlorothiazide (MICROZIDE) 12.5 MG capsule, TSH, T4, Free  Chronic fatigue - Plan: TSH, T4, Free, CMP14+EGFR, Bayer DCA Hb A1c Waived  Weight  gain, abnormal - Plan: CMP14+EGFR, Bayer DCA Hb A1c Waived  Urinary urgency - Plan: Urinalysis, Routine w reflex microscopic  Check nonfasting labs.  Check thyroid/ A1c  BP not at goal. Start HCTZ 12.'5mg'$  daily prn edema BP >140/90  Check UA given urgency. No red flags.  No orders of the defined types were placed in this encounter.  No orders of the defined types were placed in this encounter.    Selena Norlander, DO [place] [phone]

## 2022-01-28 LAB — CMP14+EGFR
ALT: 21 IU/L (ref 0–32)
AST: 20 IU/L (ref 0–40)
Albumin/Globulin Ratio: 1.6 (ref 1.2–2.2)
Albumin: 4.1 g/dL (ref 3.9–4.9)
Alkaline Phosphatase: 78 IU/L (ref 44–121)
BUN/Creatinine Ratio: 18 (ref 9–23)
BUN: 15 mg/dL (ref 6–24)
Bilirubin Total: <0.2 mg/dL (ref 0.0–1.2)
CO2: 26 mmol/L (ref 20–29)
Calcium: 9.1 mg/dL (ref 8.7–10.2)
Chloride: 102 mmol/L (ref 96–106)
Creatinine, Ser: 0.85 mg/dL (ref 0.57–1.00)
Globulin, Total: 2.6 g/dL (ref 1.5–4.5)
Glucose: 90 mg/dL (ref 70–99)
Potassium: 4.1 mmol/L (ref 3.5–5.2)
Sodium: 142 mmol/L (ref 134–144)
Total Protein: 6.7 g/dL (ref 6.0–8.5)
eGFR: 86 mL/min/{1.73_m2} (ref >59)

## 2022-01-28 LAB — T4, FREE: Free T4: 0.89 ng/dL (ref 0.82–1.77)

## 2022-01-28 LAB — TSH: TSH: 1.6 u[IU]/mL (ref 0.450–4.500)

## 2022-01-30 LAB — BAYER DCA HB A1C WAIVED: HB A1C (BAYER DCA - WAIVED): 5.7 % — ABNORMAL HIGH (ref 4.8–5.6)

## 2022-01-31 ENCOUNTER — Encounter (HOSPITAL_COMMUNITY): Payer: Medicaid Other

## 2022-02-02 ENCOUNTER — Encounter (HOSPITAL_COMMUNITY): Payer: Medicaid Other | Admitting: Physical Therapy

## 2022-02-06 ENCOUNTER — Encounter (HOSPITAL_COMMUNITY): Payer: Medicaid Other

## 2022-02-08 ENCOUNTER — Encounter (HOSPITAL_COMMUNITY): Payer: Medicaid Other | Admitting: Physical Therapy

## 2022-02-13 ENCOUNTER — Other Ambulatory Visit: Payer: Self-pay | Admitting: *Deleted

## 2022-02-13 ENCOUNTER — Telehealth: Payer: Self-pay | Admitting: *Deleted

## 2022-02-13 ENCOUNTER — Ambulatory Visit (HOSPITAL_COMMUNITY): Payer: Medicaid Other

## 2022-02-13 NOTE — Telephone Encounter (Signed)
PATIENT STARTED NEW MED FOR BP 2 WEEKS AGO AND REPORTS HER BLOOD PRESSURES ARE RUNNING HIGHER THAN BEFORE. HAS BEEN TAKING THE MEDICATION DAILY. HCTZ 12.5 MG.  READING THIS MORNING 174/91 HEART RATE 130  NOW 169/99 HEART RATE 107

## 2022-02-13 NOTE — Telephone Encounter (Signed)
Please have her come in for blood pressure check with nurse and bring her blood pressure cuff into the office so we can ensure that she is getting accurate measurements

## 2022-02-14 ENCOUNTER — Other Ambulatory Visit: Payer: Self-pay | Admitting: Family Medicine

## 2022-02-14 ENCOUNTER — Ambulatory Visit: Payer: Medicaid Other

## 2022-02-14 ENCOUNTER — Telehealth: Payer: Self-pay | Admitting: Family Medicine

## 2022-02-14 DIAGNOSIS — M5412 Radiculopathy, cervical region: Secondary | ICD-10-CM

## 2022-02-14 DIAGNOSIS — K219 Gastro-esophageal reflux disease without esophagitis: Secondary | ICD-10-CM

## 2022-02-14 DIAGNOSIS — Z0130 Encounter for examination of blood pressure without abnormal findings: Secondary | ICD-10-CM

## 2022-02-14 NOTE — Telephone Encounter (Signed)
Pt would like her PT referrals sent to the building in front of [facility] because it is much closer for her.  Please advise and call patient with update.

## 2022-02-14 NOTE — Addendum Note (Signed)
Addended by: Baruch Gouty on: 02/14/2022 04:32 PM   Modules accepted: Orders

## 2022-02-14 NOTE — Therapy (Signed)
See phone call note; spoke with patient ; missed appointment yesterday spending time with her daughter who just had a baby.  Sees back MD today and will call regarding tomorrows appointment later today.   10:11 AM, 02/14/22 Benedetta Sundstrom Small Francelia Mclaren MPT [facility] [facility] [place] [phone]

## 2022-02-14 NOTE — Telephone Encounter (Signed)
Apt scheduled for today at 3:30 with Nurse

## 2022-02-14 NOTE — Progress Notes (Signed)
Patient here today to have blood pressure checked.  Blood pressure is 133/86, pulse 75.  We checked with her machine and the reading is 146/99 pulse 95.  Her ankles are still swelling also.

## 2022-02-14 NOTE — Telephone Encounter (Signed)
Spoke with patient on the phone; she missed yesterday's appointment as she was spending time with her daughter who just had a baby.  Also sees her back MD today.  Will call about her appointment 2/14 later today after she sees MD.  10:10 AM, 02/14/22 Jenin Birdsall Small Dereon Williamsen MPT [place] [facility] [place] [phone]

## 2022-02-15 ENCOUNTER — Encounter (HOSPITAL_COMMUNITY): Payer: Medicaid Other | Admitting: Physical Therapy

## 2022-02-27 ENCOUNTER — Other Ambulatory Visit: Payer: Self-pay

## 2022-02-27 ENCOUNTER — Ambulatory Visit: Payer: Medicaid Other | Attending: Family Medicine

## 2022-02-27 DIAGNOSIS — M6281 Muscle weakness (generalized): Secondary | ICD-10-CM | POA: Insufficient documentation

## 2022-02-27 DIAGNOSIS — M5412 Radiculopathy, cervical region: Secondary | ICD-10-CM | POA: Diagnosis present

## 2022-02-27 NOTE — Therapy (Signed)
OUTPATIENT PHYSICAL THERAPY CERVICAL EVALUATION   Patient Name: Selena Howard MRN: [id number] DOB:30-Oct-1975, 47 y.o., female Today's Date: 02/27/2022  END OF SESSION:  PT End of Session - 02/27/22 0954     Visit Number 1    Number of Visits 8    Date for PT Re-Evaluation 04/28/22    PT Start Time [phone]   Patient arrived late to her appointment.   PT Stop Time 1030    PT Time Calculation (min) 34 min    Activity Tolerance Patient tolerated treatment well    Behavior During Therapy WFL for tasks assessed/performed             Past Medical History:  Diagnosis Date   Anxiety    Arthritis    Endometriosis    now s/p partial hysterectomy 06/2020   GERD (gastroesophageal reflux disease)    Osteoarthritis    Spinal stenosis at L4-L5 level    Past Surgical History:  Procedure Laterality Date   ABDOMINAL HYSTERECTOMY     partial, ovary sparing   CESAREAN SECTION     COLONOSCOPY WITH PROPOFOL N/A 08/17/2021   Procedure: COLONOSCOPY WITH PROPOFOL;  Surgeon: Harvel Quale, MD;  [facility];  Service: Gastroenterology;  Laterality: N/A;  [place]  08/17/2021   Procedure: HEMOSTASIS CLIP PLACEMENT;  Surgeon: Harvel Quale, MD;  Location: [facility];  Service: Gastroenterology;;   POLYPECTOMY  08/17/2021   Procedure: POLYPECTOMY;  Surgeon: Harvel Quale, MD;  [facility];  Service: Gastroenterology;;   SUBMUCOSAL LIFTING INJECTION  08/17/2021   Procedure: SUBMUCOSAL LIFTING INJECTION;  Surgeon: Harvel Quale, MD;  [facility];  Service: Gastroenterology;;   SUBMUCOSAL TATTOO INJECTION  08/17/2021   Procedure: SUBMUCOSAL TATTOO INJECTION;  Surgeon: Harvel Quale, MD;  [facility];  Service: Gastroenterology;;   [place] EXTRACTION Bilateral    Patient Active Problem List   Diagnosis Date Noted   Osteoarthritis 12/30/2021   IBS (irritable bowel  syndrome) 06/27/2021   Obesity, morbid ([place]) 06/27/2021   Family history of colonic polyps 06/27/2021   Elevated sedimentation rate 05/17/2021   RLS (restless legs syndrome) 03/31/2021   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   Vivid dream 03/31/2021   Snoring 03/31/2021   Chronic fatigue 03/31/2021   Excessive daytime sleepiness 03/31/2021   GERD with apnea 03/31/2021   Sleep related headaches 03/31/2021   Body aches 03/30/2021   Paresthesia 02/04/2021   Obstructive sleep apnea 02/04/2021   Vitamin D deficiency 01/28/2021   History of anxiety 01/28/2021   Bilateral hand numbness 12/16/2020   Other intervertebral disc degeneration, lumbar region 12/16/2020    PCP: [name], DO  REFERRING PROVIDER: Baruch Gouty, FNP   REFERRING DIAG: Cervical radiculopathy   THERAPY DIAG:  Radiculopathy, cervical region  Muscle weakness (generalized)  Rationale for Evaluation and Treatment: Rehabilitation  ONSET DATE: 2018  SUBJECTIVE:  SUBJECTIVE STATEMENT: Patient reports that she has been having neck pain since 2018 and it has been getting worse. She has noticed numbness, pain and tightness going down into both hands. She notes that her pain can vary, but it is always on both sides. She has also begun to notice weakness in her arms and hands as she could not pick up her cast iron pan while cooking. She has also noticed that her hands will lock up. She is right hand dominant.   PERTINENT HISTORY:  Anxiety, obesity, OA, low back pain, and fibromyalgia  PAIN:  Are you having pain? Yes: NPRS scale: 8/10 Pain location: neck and bilateral arms Pain description: burning, "crawling", numbness, tingling, throbbing Aggravating factors: holding/carrying objects,  Relieving  factors: none known  PRECAUTIONS: None  WEIGHT BEARING RESTRICTIONS: No  FALLS:  Has patient fallen in last 6 months? No  LIVING ENVIRONMENT: Lives with: lives with their family Lives in: House/apartment Has following equipment at home: None  OCCUPATION: not working  PLOF: Independent  PATIENT GOALS: reduced pain, numbness, and improved strength  NEXT MD VISIT: 04/03/22  OBJECTIVE:   DIAGNOSTIC FINDINGS: 12/14/22 cervical x-ray IMPRESSION: 1. Moderate degenerative disc disease at C5-6 with mild foraminal crowding bilaterally. 2. Incidentally noted right C7 cervical rib.  PATIENT SURVEYS:  NDI 70% disability (35/50)  COGNITION: Overall cognitive status: Within functional limits for tasks assessed  SENSATION: Patient reports numbness in both hands. Light touch: diminished sensation in bilateral hands with no dermatomal pattern  POSTURE: rounded shoulders, forward head, and increased thoracic kyphosis  PALPATION: TTP with referred pain: bilateral upper trapezius, deltoid, supraspinatus, infraspinatus, scapular stabilizers, latissimus dorsi, biceps, and triceps   CERVICAL ROM:   Active ROM A/PROM (deg) eval  Flexion 44; pulling and aching  Extension 30; aching  Right lateral flexion 28; painful (R>L)  Left lateral flexion 28; painful (R>L)  Right rotation 56; pulling and sore  Left rotation 61; tight "like it wants to pop"   (Blank rows = not tested)  UPPER EXTREMITY MMT:  MMT Right eval Left eval  Shoulder flexion 3+/5; sore 3+/5; sore  Shoulder extension    Shoulder abduction    Shoulder adduction 4/5 4/5  Shoulder extension    Shoulder internal rotation    Shoulder external rotation    Middle trapezius    Lower trapezius    Elbow flexion 4/5 4/5  Elbow extension 4-/5 4/5  Wrist flexion    Wrist extension    Wrist ulnar deviation    Wrist radial deviation    Wrist pronation    Wrist supination    Grip strength 10; sore (R>L) 7; sore (R>L)    (Blank rows = not tested)  CERVICAL SPECIAL TESTS:  Spurling's test: Positive, Distraction test: Positive, and Sharp pursor's test: Negative  TODAY'S TREATMENT:  DATE:    PATIENT EDUCATION:  Education details: POC, referred pain, anatomy, healing, and goals for therapy Person educated: Patient Education method: Explanation Education comprehension: verbalized understanding  HOME EXERCISE PROGRAM:   ASSESSMENT:  CLINICAL IMPRESSION: Patient is a 47 y.o. female who was seen today for physical therapy evaluation and treatment for cervical pain with bilateral upper extremity pain and numbness. She presented with high pain and severity with cervical AROM reproducing her familiar symptoms. She also exhibited reduced upper extremity strength bilaterally. Recommend that she continue skilled physical therapy to address her impairments to maximize her functional mobility.    OBJECTIVE IMPAIRMENTS: decreased activity tolerance, decreased knowledge of condition, decreased mobility, decreased ROM, decreased strength, hypomobility, impaired sensation, impaired tone, impaired UE functional use, postural dysfunction, and pain.   ACTIVITY LIMITATIONS: carrying, lifting, sleeping, reach over head, and caring for others  PARTICIPATION LIMITATIONS: meal prep, cleaning, laundry, driving, shopping, and community activity  PERSONAL FACTORS: Past/current experiences, Time since onset of injury/illness/exacerbation, and 3+ comorbidities: Anxiety, obesity, OA, low back pain, and fibromyalgia  are also affecting patient's functional outcome.   REHAB POTENTIAL: Fair    CLINICAL DECISION MAKING: Unstable/unpredictable  EVALUATION COMPLEXITY: High   GOALS: Goals reviewed with patient? Yes  LONG TERM GOALS: Target date: 03/27/22  Patient will be independent with her HEP. Baseline: no  HEP provided at this time Goal status: INITIAL  2.  Patient will be able to complete her daily activities without her familiar cervical symptoms exceeding 6/10.  Baseline: 8/10 at most Goal status: INITIAL  3.  Patient will be able to carrying at least 5 pounds without reproducing her familiar symptoms for improved function with her daily activities.  Baseline: carrying aggravates her familiar pain Goal status: INITIAL  4.  Patient will report being able to sleep at least 5 hours without being awakened by her familiar cervical symptoms.  Baseline: 2-3 hours currently Goal status: INITIAL  PLAN:  PT FREQUENCY: 2x/week  PT DURATION: 4 weeks  PLANNED INTERVENTIONS: Therapeutic exercises, Therapeutic activity, Neuromuscular re-education, Patient/Family education, Self Care, Joint mobilization, Spinal mobilization, Cryotherapy, Moist heat, Manual therapy, and Re-evaluation  PLAN FOR NEXT SESSION: isometrics, manual therapy, and provide HEP (as able)    Darlin Coco, PT 02/27/2022, 1:25 PM

## 2022-03-10 ENCOUNTER — Ambulatory Visit: Payer: Medicaid Other

## 2022-03-13 ENCOUNTER — Ambulatory Visit: Payer: Medicaid Other | Attending: Family Medicine | Admitting: Physical Therapy

## 2022-03-13 DIAGNOSIS — M6281 Muscle weakness (generalized): Secondary | ICD-10-CM | POA: Insufficient documentation

## 2022-03-13 DIAGNOSIS — M5412 Radiculopathy, cervical region: Secondary | ICD-10-CM | POA: Insufficient documentation

## 2022-03-15 ENCOUNTER — Encounter (HOSPITAL_COMMUNITY): Payer: Self-pay | Admitting: Physical Therapy

## 2022-03-15 NOTE — Therapy (Signed)
PHYSICAL THERAPY DISCHARGE SUMMARY  Visits from Start of Care: 2  Current functional level related to goals / functional outcomes: unknown   Remaining deficits: unknown   Education / Equipment: HEP   Patient agrees to discharge. Patient goals were unknown. Patient is being discharged due to not returning since the last visit. Rayetta Humphrey, [place] CLT [phone]

## 2022-03-16 ENCOUNTER — Ambulatory Visit: Payer: Medicaid Other

## 2022-03-16 DIAGNOSIS — M6281 Muscle weakness (generalized): Secondary | ICD-10-CM | POA: Diagnosis present

## 2022-03-16 DIAGNOSIS — M5412 Radiculopathy, cervical region: Secondary | ICD-10-CM | POA: Diagnosis not present

## 2022-03-16 NOTE — Therapy (Signed)
OUTPATIENT PHYSICAL THERAPY CERVICAL TREATMENT   Patient Name: Selena Howard MRN: [id number] DOB:07/01/1975, 47 y.o., female Today's Date: 03/16/2022  END OF SESSION:  PT End of Session - 03/16/22 1302     Visit Number 2    Number of Visits 8    Date for PT Re-Evaluation 04/28/22    PT Start Time 1300    PT Stop Time 1340    PT Time Calculation (min) 40 min    Activity Tolerance Patient limited by pain    Behavior During Therapy [facility] for tasks assessed/performed             Past Medical History:  Diagnosis Date   Anxiety    Arthritis    Endometriosis    now s/p partial hysterectomy 06/2020   GERD (gastroesophageal reflux disease)    Osteoarthritis    Spinal stenosis at L4-L5 level    Past Surgical History:  Procedure Laterality Date   ABDOMINAL HYSTERECTOMY     partial, ovary sparing   CESAREAN SECTION     COLONOSCOPY WITH PROPOFOL N/A 08/17/2021   Procedure: COLONOSCOPY WITH PROPOFOL;  Surgeon: Harvel Quale, MD;  [facility];  Service: Gastroenterology;  Laterality: N/A;  [place]  08/17/2021   Procedure: HEMOSTASIS CLIP PLACEMENT;  Surgeon: Harvel Quale, MD;  [facility];  Service: Gastroenterology;;   POLYPECTOMY  08/17/2021   Procedure: POLYPECTOMY;  Surgeon: Harvel Quale, MD;  [facility];  Service: Gastroenterology;;   SUBMUCOSAL LIFTING INJECTION  08/17/2021   Procedure: SUBMUCOSAL LIFTING INJECTION;  Surgeon: Harvel Quale, MD;  [facility];  Service: Gastroenterology;;   SUBMUCOSAL TATTOO INJECTION  08/17/2021   Procedure: SUBMUCOSAL TATTOO INJECTION;  Surgeon: Harvel Quale, MD;  [facility];  Service: Gastroenterology;;   [place] EXTRACTION Bilateral    Patient Active Problem List   Diagnosis Date Noted   Osteoarthritis 12/30/2021   IBS (irritable bowel syndrome) 06/27/2021   Obesity, morbid ([place])  06/27/2021   Family history of colonic polyps 06/27/2021   Elevated sedimentation rate 05/17/2021   RLS (restless legs syndrome) 03/31/2021   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   Vivid dream 03/31/2021   Snoring 03/31/2021   Chronic fatigue 03/31/2021   Excessive daytime sleepiness 03/31/2021   GERD with apnea 03/31/2021   Sleep related headaches 03/31/2021   Body aches 03/30/2021   Paresthesia 02/04/2021   Obstructive sleep apnea 02/04/2021   Vitamin D deficiency 01/28/2021   History of anxiety 01/28/2021   Bilateral hand numbness 12/16/2020   Other intervertebral disc degeneration, lumbar region 12/16/2020    PCP: [name], DO  REFERRING PROVIDER: Baruch Gouty, FNP   REFERRING DIAG: Cervical radiculopathy   THERAPY DIAG:  Radiculopathy, cervical region  Muscle weakness (generalized)  Rationale for Evaluation and Treatment: Rehabilitation  ONSET DATE: 2018  SUBJECTIVE:  SUBJECTIVE STATEMENT: Patient reports that her right arm and right side of her neck is hurting and burning today.   PERTINENT HISTORY:  Anxiety, obesity, OA, low back pain, and fibromyalgia  PAIN:  Are you having pain? Yes: NPRS scale: 5/10 Pain location: neck and bilateral arms Pain description: burning, "crawling", numbness, tingling, throbbing Aggravating factors: holding/carrying objects,  Relieving factors: none known  PRECAUTIONS: None  WEIGHT BEARING RESTRICTIONS: No  FALLS:  Has patient fallen in last 6 months? No  LIVING ENVIRONMENT: Lives with: lives with their family Lives in: House/apartment Has following equipment at home: None  OCCUPATION: not working  PLOF: Independent  PATIENT GOALS: reduced pain, numbness, and improved strength  NEXT MD VISIT:  04/03/22  OBJECTIVE: all objective assessments were assessed at her initial evaluation on 02/27/22 unless otherwise noted  DIAGNOSTIC FINDINGS: 12/14/22 cervical x-ray IMPRESSION: 1. Moderate degenerative disc disease at C5-6 with mild foraminal crowding bilaterally. 2. Incidentally noted right C7 cervical rib.  PATIENT SURVEYS:  NDI 70% disability (35/50)  COGNITION: Overall cognitive status: Within functional limits for tasks assessed  SENSATION: Patient reports numbness in both hands. Light touch: diminished sensation in bilateral hands with no dermatomal pattern  POSTURE: rounded shoulders, forward head, and increased thoracic kyphosis  PALPATION: TTP with referred pain: bilateral upper trapezius, deltoid, supraspinatus, infraspinatus, scapular stabilizers, latissimus dorsi, biceps, and triceps   CERVICAL ROM:   Active ROM A/PROM (deg) eval  Flexion 44; pulling and aching  Extension 30; aching  Right lateral flexion 28; painful (R>L)  Left lateral flexion 28; painful (R>L)  Right rotation 56; pulling and sore  Left rotation 61; tight "like it wants to pop"   (Blank rows = not tested)  UPPER EXTREMITY MMT:  MMT Right eval Left eval  Shoulder flexion 3+/5; sore 3+/5; sore  Shoulder extension    Shoulder abduction    Shoulder adduction 4/5 4/5  Shoulder extension    Shoulder internal rotation    Shoulder external rotation    Middle trapezius    Lower trapezius    Elbow flexion 4/5 4/5  Elbow extension 4-/5 4/5  Wrist flexion    Wrist extension    Wrist ulnar deviation    Wrist radial deviation    Wrist pronation    Wrist supination    Grip strength 10; sore (R>L) 7; sore (R>L)   (Blank rows = not tested)  CERVICAL SPECIAL TESTS:  Spurling's test: Positive, Distraction test: Positive, and Sharp pursor's test: Negative  TODAY'S TREATMENT:                                                                                                                               DATE:                                     3/14 EXERCISE LOG  Exercise Repetitions and Resistance Comments  Scapular retraction  30 reps    Isometric ball press 20 reps w/ 5 second hold    Ball roll out  20 reps   Self cervical distraction     Cervical SNAG's  15 reps each   For bilateral rotation   Blank cell = exercise not performed today  Manual Therapy Soft Tissue Mobilization: right upper trapezius, levator scapulae, left cervical paraspinals, and suboccipitals , for reduced pain and tone Joint Mobilizations: C3-T2, grade I-II CPA's Manual Traction: cervical, for reduced pain    PATIENT EDUCATION:  Education details: POC, referred pain, anatomy, healing, and goals for therapy Person educated: Patient Education method: Explanation Education comprehension: verbalized understanding  HOME EXERCISE PROGRAM:   ASSESSMENT:  CLINICAL IMPRESSION: Treatment focused on manual therapy and appropriately matched interventions. Manual therapy focused on soft tissue mobilization to her cervical musculature. This was able to slightly improve her symptoms, but it was unable to provide relief throughout treatment. She experience a mild increase in her familiar symptoms with today's interventions. She reported feeling about the same upon the conclusion of treatment. She continues to require skilled physical therapy to address her remaining symptoms to maximize her functional mobility.   OBJECTIVE IMPAIRMENTS: decreased activity tolerance, decreased knowledge of condition, decreased mobility, decreased ROM, decreased strength, hypomobility, impaired sensation, impaired tone, impaired UE functional use, postural dysfunction, and pain.   ACTIVITY LIMITATIONS: carrying, lifting, sleeping, reach over head, and caring for others  PARTICIPATION LIMITATIONS: meal prep, cleaning, laundry, driving, shopping, and community activity  PERSONAL FACTORS: Past/current experiences, Time since onset of  injury/illness/exacerbation, and 3+ comorbidities: Anxiety, obesity, OA, low back pain, and fibromyalgia  are also affecting patient's functional outcome.   REHAB POTENTIAL: Fair    CLINICAL DECISION MAKING: Unstable/unpredictable  EVALUATION COMPLEXITY: High   GOALS: Goals reviewed with patient? Yes  LONG TERM GOALS: Target date: 03/27/22  Patient will be independent with her HEP. Baseline: no HEP provided at this time Goal status: INITIAL  2.  Patient will be able to complete her daily activities without her familiar cervical symptoms exceeding 6/10.  Baseline: 8/10 at most Goal status: INITIAL  3.  Patient will be able to carrying at least 5 pounds without reproducing her familiar symptoms for improved function with her daily activities.  Baseline: carrying aggravates her familiar pain Goal status: INITIAL  4.  Patient will report being able to sleep at least 5 hours without being awakened by her familiar cervical symptoms.  Baseline: 2-3 hours currently Goal status: INITIAL  PLAN:  PT FREQUENCY: 2x/week  PT DURATION: 4 weeks  PLANNED INTERVENTIONS: Therapeutic exercises, Therapeutic activity, Neuromuscular re-education, Patient/Family education, Self Care, Joint mobilization, Spinal mobilization, Cryotherapy, Moist heat, Manual therapy, and Re-evaluation  PLAN FOR NEXT SESSION: isometrics, manual therapy, and provide HEP (as able)    Darlin Coco, PT 03/16/2022, 3:07 PM

## 2022-03-21 ENCOUNTER — Ambulatory Visit: Payer: Medicaid Other

## 2022-03-21 DIAGNOSIS — M6281 Muscle weakness (generalized): Secondary | ICD-10-CM

## 2022-03-21 DIAGNOSIS — M5412 Radiculopathy, cervical region: Secondary | ICD-10-CM | POA: Diagnosis not present

## 2022-03-21 NOTE — Therapy (Addendum)
OUTPATIENT PHYSICAL THERAPY CERVICAL TREATMENT   Patient Name: Selena Howard MRN: [id number] DOB:[birth year], 47 y.o., female Today's Date: 03/21/2022  END OF SESSION:  PT End of Session - 03/21/22 0906     Visit Number 3    Number of Visits 8    Date for PT Re-Evaluation 04/28/22    PT Start Time 0902    PT Stop Time 0942    PT Time Calculation (min) 40 min    Activity Tolerance Patient tolerated treatment well    Behavior During Therapy [facility] for tasks assessed/performed             Past Medical History:  Diagnosis Date   Anxiety    Arthritis    Endometriosis    now s/p partial hysterectomy 06/2020   GERD (gastroesophageal reflux disease)    Osteoarthritis    Spinal stenosis at L4-L5 level    Past Surgical History:  Procedure Laterality Date   ABDOMINAL HYSTERECTOMY     partial, ovary sparing   CESAREAN SECTION     COLONOSCOPY WITH PROPOFOL N/A 08/17/2021   Procedure: COLONOSCOPY WITH PROPOFOL;  Surgeon: Dolores Frame, MD;  Location: [facility];  Service: Gastroenterology;  Laterality: N/A;  915   HEMOSTASIS CLIP PLACEMENT  08/17/2021   Procedure: HEMOSTASIS CLIP PLACEMENT;  Surgeon: Dolores Frame, MD;  Location: [facility];  Service: Gastroenterology;;   POLYPECTOMY  08/17/2021   Procedure: POLYPECTOMY;  Surgeon: Dolores Frame, MD;  Location: [facility];  Service: Gastroenterology;;   SUBMUCOSAL LIFTING INJECTION  08/17/2021   Procedure: SUBMUCOSAL LIFTING INJECTION;  Surgeon: Dolores Frame, MD;  Location: [facility];  Service: Gastroenterology;;   SUBMUCOSAL TATTOO INJECTION  08/17/2021   Procedure: SUBMUCOSAL TATTOO INJECTION;  Surgeon: Dolores Frame, MD;  Location: [facility];  Service: Gastroenterology;;   WISDOM TOOTH EXTRACTION Bilateral    Patient Active Problem List   Diagnosis Date Noted   Osteoarthritis 12/30/2021   IBS (irritable bowel syndrome) 06/27/2021   Obesity, morbid (HCC)  06/27/2021   Family history of colonic polyps 06/27/2021   Elevated sedimentation rate 05/17/2021   RLS (restless legs syndrome) 03/31/2021   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   Vivid dream 03/31/2021   Snoring 03/31/2021   Chronic fatigue 03/31/2021   Excessive daytime sleepiness 03/31/2021   GERD with apnea 03/31/2021   Sleep related headaches 03/31/2021   Body aches 03/30/2021   Paresthesia 02/04/2021   Obstructive sleep apnea 02/04/2021   Vitamin D deficiency 01/28/2021   History of anxiety 01/28/2021   Bilateral hand numbness 12/16/2020   Other intervertebral disc degeneration, lumbar region 12/16/2020    PCP: Raliegh Ip, DO  REFERRING PROVIDER: Sonny Masters, FNP   REFERRING DIAG: Cervical radiculopathy   THERAPY DIAG:  Radiculopathy, cervical region  Muscle weakness (generalized)  Rationale for Evaluation and Treatment: Rehabilitation  ONSET DATE: 2018  SUBJECTIVE:  SUBJECTIVE STATEMENT: Patient reports that she feels about the same today and was not any worse after her last appointment.   PERTINENT HISTORY:  Anxiety, obesity, OA, low back pain, and fibromyalgia  PAIN:  Are you having pain? Yes: NPRS scale: 4-5/10 Pain location: neck and bilateral arms Pain description: burning, "crawling", numbness, tingling, throbbing Aggravating factors: holding/carrying objects,  Relieving factors: none known  PRECAUTIONS: None  WEIGHT BEARING RESTRICTIONS: No  FALLS:  Has patient fallen in last 6 months? No  LIVING ENVIRONMENT: Lives with: lives with their family Lives in: House/apartment Has following equipment at home: None  OCCUPATION: not working  PLOF: Independent  PATIENT GOALS: reduced pain, numbness, and improved strength  NEXT MD  VISIT: 04/03/22  OBJECTIVE: all objective assessments were assessed at her initial evaluation on 02/27/22 unless otherwise noted  DIAGNOSTIC FINDINGS: 12/14/22 cervical x-ray IMPRESSION: 1. Moderate degenerative disc disease at C5-6 with mild foraminal crowding bilaterally. 2. Incidentally noted right C7 cervical rib.  PATIENT SURVEYS:  NDI 70% disability (35/50)  COGNITION: Overall cognitive status: Within functional limits for tasks assessed  SENSATION: Patient reports numbness in both hands. Light touch: diminished sensation in bilateral hands with no dermatomal pattern  POSTURE: rounded shoulders, forward head, and increased thoracic kyphosis  PALPATION: TTP with referred pain: bilateral upper trapezius, deltoid, supraspinatus, infraspinatus, scapular stabilizers, latissimus dorsi, biceps, and triceps   CERVICAL ROM:   Active ROM A/PROM (deg) eval  Flexion 44; pulling and aching  Extension 30; aching  Right lateral flexion 28; painful (R>L)  Left lateral flexion 28; painful (R>L)  Right rotation 56; pulling and sore  Left rotation 61; tight "like it wants to pop"   (Blank rows = not tested)  UPPER EXTREMITY MMT:  MMT Right eval Left eval  Shoulder flexion 3+/5; sore 3+/5; sore  Shoulder extension    Shoulder abduction    Shoulder adduction 4/5 4/5  Shoulder extension    Shoulder internal rotation    Shoulder external rotation    Middle trapezius    Lower trapezius    Elbow flexion 4/5 4/5  Elbow extension 4-/5 4/5  Wrist flexion    Wrist extension    Wrist ulnar deviation    Wrist radial deviation    Wrist pronation    Wrist supination    Grip strength 10; sore (R>L) 7; sore (R>L)   (Blank rows = not tested)  CERVICAL SPECIAL TESTS:  Spurling's test: Positive, Distraction test: Positive, and Sharp pursor's test: Negative  TODAY'S TREATMENT:                                                                                                                               DATE:                                     3/19 EXERCISE LOG  Exercise Repetitions and Resistance  Comments  Chin tuck 2 minutes    Bilateral ER (supine) Red t-band x 2.5 minutes   UBE  X6 minutes @ 120 RPM    Horizontal ABD stretch  3 x 30 seconds each         Blank cell = exercise not performed today  Manual Therapy Soft Tissue Mobilization: suboccipitals and cervical paraspinals, for reduced pain and tone Joint Mobilizations: Cervical CPA's and lateral glides, grade I-II Manual Traction: cervical, for reduced pain                                      3/14 EXERCISE LOG  Exercise Repetitions and Resistance Comments  Scapular retraction  30 reps    Isometric ball press 20 reps w/ 5 second hold    Ball roll out  20 reps   Self cervical distraction     Cervical SNAG's  15 reps each   For bilateral rotation   Blank cell = exercise not performed today  Manual Therapy Soft Tissue Mobilization: right upper trapezius, levator scapulae, left cervical paraspinals, and suboccipitals , for reduced pain and tone Joint Mobilizations: C3-T2, grade I-II CPA's Manual Traction: cervical, for reduced pain    PATIENT EDUCATION:  Education details: POC, referred pain, anatomy, healing, and goals for therapy Person educated: Patient Education method: Explanation Education comprehension: verbalized understanding  HOME EXERCISE PROGRAM:   ASSESSMENT:  CLINICAL IMPRESSION: Patient was introduced to chin tucks and multiple new interventions for cervical and shoulder muscular engagement without aggravating her familiar symptoms. She required minimal cueing with chin tucks for proper exercise performance to avoid compensation from the surrounding musculature. She experienced no significant increase in pain or discomfort with any of today's interventions. She reported that she did not feel any worse upon the conclusion of treatment. She continues to require skilled physical therapy to address  her remaining impairments to maximize her functional mobility.   PHYSICAL THERAPY DISCHARGE SUMMARY  Visits from Start of Care: 3  Current functional level related to goals / functional outcomes: Patient was unable to meet her goals for skilled physical therapy as she did not complete her recommended plan of care.    Remaining deficits: Pain    Education / Equipment: HEP   Patient agrees to discharge. Patient goals were not met. Patient is being discharged due to not returning since the last visit.  Candi Leash, PT, DPT    OBJECTIVE IMPAIRMENTS: decreased activity tolerance, decreased knowledge of condition, decreased mobility, decreased ROM, decreased strength, hypomobility, impaired sensation, impaired tone, impaired UE functional use, postural dysfunction, and pain.   ACTIVITY LIMITATIONS: carrying, lifting, sleeping, reach over head, and caring for others  PARTICIPATION LIMITATIONS: meal prep, cleaning, laundry, driving, shopping, and community activity  PERSONAL FACTORS: Past/current experiences, Time since onset of injury/illness/exacerbation, and 3+ comorbidities: Anxiety, obesity, OA, low back pain, and fibromyalgia  are also affecting patient's functional outcome.   REHAB POTENTIAL: Fair    CLINICAL DECISION MAKING: Unstable/unpredictable  EVALUATION COMPLEXITY: High   GOALS: Goals reviewed with patient? Yes  LONG TERM GOALS: Target date: 03/27/22  Patient will be independent with her HEP. Baseline: no HEP provided at this time Goal status: INITIAL  2.  Patient will be able to complete her daily activities without her familiar cervical symptoms exceeding 6/10.  Baseline: 8/10 at most Goal status: INITIAL  3.  Patient will be able to carrying at least 5 pounds  without reproducing her familiar symptoms for improved function with her daily activities.  Baseline: carrying aggravates her familiar pain Goal status: INITIAL  4.  Patient will report being able to  sleep at least 5 hours without being awakened by her familiar cervical symptoms.  Baseline: 2-3 hours currently Goal status: INITIAL  PLAN:  PT FREQUENCY: 2x/week  PT DURATION: 4 weeks  PLANNED INTERVENTIONS: Therapeutic exercises, Therapeutic activity, Neuromuscular re-education, Patient/Family education, Self Care, Joint mobilization, Spinal mobilization, Cryotherapy, Moist heat, Manual therapy, and Re-evaluation  PLAN FOR NEXT SESSION: isometrics, manual therapy, and provide HEP (as able)    Granville Lewis, PT 03/21/2022, 11:45 AM

## 2022-03-24 ENCOUNTER — Ambulatory Visit: Payer: Medicaid Other | Admitting: Family Medicine

## 2022-03-27 ENCOUNTER — Encounter: Payer: Self-pay | Admitting: Family Medicine

## 2022-03-28 ENCOUNTER — Encounter: Payer: Medicaid Other | Admitting: *Deleted

## 2022-03-28 DIAGNOSIS — R002 Palpitations: Secondary | ICD-10-CM | POA: Insufficient documentation

## 2022-03-28 DIAGNOSIS — Z87891 Personal history of nicotine dependence: Secondary | ICD-10-CM | POA: Insufficient documentation

## 2022-03-28 DIAGNOSIS — F1120 Opioid dependence, uncomplicated: Secondary | ICD-10-CM | POA: Insufficient documentation

## 2022-04-03 ENCOUNTER — Encounter: Payer: Self-pay | Admitting: Family Medicine

## 2022-04-03 ENCOUNTER — Ambulatory Visit (INDEPENDENT_AMBULATORY_CARE_PROVIDER_SITE_OTHER): Payer: Medicaid Other

## 2022-04-03 ENCOUNTER — Ambulatory Visit: Payer: Medicaid Other | Admitting: Family Medicine

## 2022-04-03 VITALS — BP 131/84 | HR 90 | Temp 98.6°F | Ht 63.0 in | Wt 281.0 lb

## 2022-04-03 DIAGNOSIS — J40 Bronchitis, not specified as acute or chronic: Secondary | ICD-10-CM

## 2022-04-03 DIAGNOSIS — E038 Other specified hypothyroidism: Secondary | ICD-10-CM | POA: Diagnosis not present

## 2022-04-03 MED ORDER — AZITHROMYCIN 250 MG PO TABS: ORAL_TABLET | ORAL | 0 refills | Status: DC

## 2022-04-03 MED ORDER — LEVOTHYROXINE SODIUM 25 MCG PO TABS
25.0000 ug | ORAL_TABLET | Freq: Every day | ORAL | 3 refills | Status: DC
Start: 2022-04-03 — End: 2023-04-12

## 2022-04-03 MED ORDER — PROMETHAZINE-DM 6.25-15 MG/5ML PO SYRP: 2.5000 mL | ORAL_SOLUTION | Freq: Four times a day (QID) | ORAL | 0 refills | Status: DC | PRN

## 2022-04-03 MED ORDER — BENZONATATE 100 MG PO CAPS: 100.0000 mg | ORAL_CAPSULE | Freq: Three times a day (TID) | ORAL | 0 refills | Status: DC | PRN

## 2022-04-03 NOTE — Progress Notes (Signed)
Subjective: Selena Howard PCP: [name], DO [id number] Nealis is a 47 y.o. female presenting to clinic today for:  1. Cough Patient reports harsh cough that onset about a week ago.  Its gotten worse and she reports that the cough now has brown sputum.  She is not sure if there may be was a streak of blood before.  No fevers.  Reports rib pain due to the coughing.  Sometimes feels short of breath during coughing spells.  No known exposures.  No other symptoms including rhinorrhea, myalgia, GI symptoms.  Has been using pretty much everything over-the-counter but nothing has gotten the cough to get better  2.?  Hypothyroidism Patient reports that she had labs done at an outside facility and her free T4 was low.  She has had borderline low free T4 for quite some time.  She continues to have difficulty with weight gain.  Edema has gotten somewhat better since discontinuing the Lyrica.  She continues to take Norco and meloxicam as needed.   ROS: Per HPI  Allergies  Allergen Reactions   Keflex [Cephalexin] Nausea And Vomiting   Past Medical History:  Diagnosis Date   Anxiety    Arthritis    Endometriosis    now s/p partial hysterectomy 06/2020   GERD (gastroesophageal reflux disease)    Osteoarthritis    Spinal stenosis at L4-L5 level     Current Outpatient Medications:    betamethasone valerate (VALISONE) 0.1 % cream, Apply 1 Application topically daily as needed (foor rash)., Disp: , Rfl:    DULoxetine (CYMBALTA) 60 MG capsule, Take 60 mg by mouth daily., Disp: , Rfl:    esomeprazole (NEXIUM) 40 MG capsule, Take 1 capsule by mouth once daily, Disp: 90 capsule, Rfl: 0   fluticasone (FLONASE) 50 MCG/ACT nasal spray, Place 2 sprays into both nostrils daily., Disp: 16 g, Rfl: 2   hydrochlorothiazide (MICROZIDE) 12.5 MG capsule, Take 1 capsule (12.5 mg total) by mouth daily. For BP >140/90 or leg swelling, Disp: 90 capsule, Rfl: 3   HYDROcodone-acetaminophen (NORCO) 7.5-325 MG  tablet, Take 1 tablet by mouth every 6 (six) hours as needed for moderate pain., Disp: , Rfl:    meloxicam (MOBIC) 15 MG tablet, Take 1 tablet (15 mg total) by mouth daily., Disp: 30 tablet, Rfl: 0   torsemide (DEMADEX) 20 MG tablet, TAKE 1 TABLET BY MOUTH ONCE DAILY. START WITH 1 TABLET AND REDURE TO 1/2 (ONE-HALF) TABLET AS DIRECTED, Disp: , Rfl:    pregabalin (LYRICA) 75 MG capsule, Take 1 capsule (75 mg total) by mouth 2 (two) times daily. (Patient not taking: Reported on 04/03/2022), Disp: 60 capsule, Rfl: 5 Social History   Socioeconomic History   Marital status: Divorced    Spouse name: Not on file   Number of children: 3   Years of education: 12   Highest education level: High school graduate  Occupational History   Not on file  Tobacco Use   Smoking status: Former    Packs/day: 0.50    Years: 10.00    Additional pack years: 0.00    Total pack years: 5.00    Types: Cigarettes    Quit date: 06/02/2020    Years since quitting: 1.8    Passive exposure: Past   Smokeless tobacco: Never  Vaping Use   Vaping Use: Never used  Substance and Sexual Activity   Alcohol use: Not Currently   Drug use: Not Currently   Sexual activity: Not Currently    Birth control/protection:  Surgical  Other Topics Concern   Not on file  Social History Narrative   Right handed   Caffeine use: no tea, Coffee-tries to drink decaf, diet sodas   Mother also sees me, [name]   Social Determinants of Health   Financial Resource Strain: Not on file  Food Insecurity: Not on file  Transportation Needs: Not on file  Physical Activity: Not on file  Stress: Not on file  Social Connections: Not on file  Intimate Partner Violence: Not on file   Family History  Problem Relation Age of Onset   Diabetes Mother    Liver disease Mother    COPD Father    Diabetes Father    Heart disease Maternal Grandmother    Cancer Maternal Grandfather    Cancer Paternal Grandmother    Diabetes Paternal  Grandfather     Objective: Office vital signs reviewed. BP 131/84   Pulse 90   Temp 98.6 F (37 C)   Ht 5\' 3"  (1.6 m)   Wt 281 lb (127.5 kg)   SpO2 96%   BMI 49.78 kg/m   Physical Examination:  General: Awake, alert, morbidly obese, No acute distress HEENT: Normal.  No exophthalmos.    Neck: No masses palpated. No lymphadenopathy    Ears: Tympanic membranes intact, normal light reflex, no erythema, no bulging    Eyes: PERRLA, extraocular membranes intact, sclera white    Nose: nasal turbinates moist, clear nasal discharge    Throat: moist mucus membranes, no erythema, no tonsillar exudate.  Airway is patent Cardio: regular rate and rhythm, S1S2 heard, no murmurs appreciated Pulm: clear to auscultation bilaterally, no wheezes, rhonchi or rales; normal work of breathing on room air.  Harsh coughing spells observed  Assessment/ Plan: 47 y.o. female   Bronchitis - Plan: DG Chest 2 View, azithromycin (ZITHROMAX) 250 MG tablet, benzonatate (TESSALON PERLES) 100 MG capsule, promethazine-dextromethorphan (PROMETHAZINE-DM) 6.25-15 MG/5ML syrup, COVID-19, Flu A+B and RSV  Subclinical hypothyroidism - Plan: TSH, T4, free, levothyroxine (SYNTHROID) 25 MCG tablet  Chest x-ray was obtained.  Personal review did not demonstrate any overt pulmonary infiltrates but awaiting formal review by radiology.  Given reports of brown sputum I am going to cover her with azithromycin for suspected strep pneumonia a.  Tessalon Perles and promethazine with dextromethorphan given.  Follow-up as needed  She certainly fits the picture for subclinical hypothyroidism.  I will place her on Synthroid 25 mcg daily.  She may start this after she has gotten of her acute illness.  Free T4 and TSH to be repeated in 6 to 8 weeks.  She is to schedule follow-up visit.  Discussed how to appropriately take Synthroid.  No orders of the defined types were placed in this encounter.  No orders of the defined types were placed  in this encounter.    [name], DO [place] [phone]

## 2022-04-04 LAB — COVID-19, FLU A+B AND RSV
Influenza A, NAA: NOT DETECTED
Influenza B, NAA: NOT DETECTED
RSV, NAA: NOT DETECTED
SARS-CoV-2, NAA: NOT DETECTED

## 2022-04-07 ENCOUNTER — Ambulatory Visit: Payer: Medicaid Other | Attending: Family Medicine

## 2022-04-13 ENCOUNTER — Ambulatory Visit: Payer: Medicaid Other

## 2022-05-03 ENCOUNTER — Ambulatory Visit: Payer: Medicaid Other | Admitting: Family Medicine

## 2022-05-03 ENCOUNTER — Encounter: Payer: Self-pay | Admitting: Family Medicine

## 2022-05-03 VITALS — BP 126/82 | HR 92 | Temp 98.3°F | Ht 63.0 in | Wt 278.0 lb

## 2022-05-03 DIAGNOSIS — E038 Other specified hypothyroidism: Secondary | ICD-10-CM

## 2022-05-03 DIAGNOSIS — I10 Essential (primary) hypertension: Secondary | ICD-10-CM

## 2022-05-03 DIAGNOSIS — K219 Gastro-esophageal reflux disease without esophagitis: Secondary | ICD-10-CM

## 2022-05-03 DIAGNOSIS — J301 Allergic rhinitis due to pollen: Secondary | ICD-10-CM

## 2022-05-03 LAB — BAYER DCA HB A1C WAIVED: HB A1C (BAYER DCA - WAIVED): 5.5 % (ref 4.8–5.6)

## 2022-05-03 MED ORDER — ESOMEPRAZOLE MAGNESIUM 40 MG PO CPDR: 40.0000 mg | DELAYED_RELEASE_CAPSULE | Freq: Every day | ORAL | 3 refills | Status: DC

## 2022-05-03 MED ORDER — MONTELUKAST SODIUM 10 MG PO TABS
10.0000 mg | ORAL_TABLET | Freq: Every day | ORAL | 3 refills | Status: DC
Start: 2022-05-03 — End: 2023-06-05

## 2022-05-03 NOTE — Progress Notes (Signed)
Subjective: CC: Follow-up subclinical hypothyroidism, morbid obesity PCP: Raliegh Ip, DO [id number] Selena Howard is a 47 y.o. female presenting to clinic today for:  1.  Rhinosinusitis Patient reports that she almost feels like she is starting to get the same type of upper respiratory that she had a month ago.  She was treated with azithromycin at that time.  She does report some scant brown mucus when she is able to cough up mucus but typically the cough is dry.  She has not noticed a ton of postnasal drainage.  She is not having any fevers and has no known sick contacts.  Utilizes her fluid pill as needed.  Sometimes she worries about heart failure because her father presented similarly but notes that she had a recent chest x-ray on Monday that demonstrated no infectious processes nor any fluid buildup in the lungs  2.  Morbid obesity Patient has an appointment soon to further discuss bariatric surgery.  She is considering sleeve.  She is trying to time this appropriately so that she can still take a vacation.  3.  Hypothyroidism Bariatric place referral to endocrinology for her.  She is compliant with Synthroid.  Reports waxing and waning energy levels.  Continues to have intermittent swelling of the legs.    ROS: Per HPI  Allergies  Allergen Reactions   Keflex [Cephalexin] Nausea And Vomiting   Past Medical History:  Diagnosis Date   Anxiety    Arthritis    Endometriosis    now s/p partial hysterectomy 06/2020   GERD (gastroesophageal reflux disease)    Osteoarthritis    Spinal stenosis at L4-L5 level     Current Outpatient Medications:    azithromycin (ZITHROMAX) 250 MG tablet, Take 2 tablets today, then take 1 tablet daily until gone., Disp: 6 tablet, Rfl: 0   benzonatate (TESSALON PERLES) 100 MG capsule, Take 1 capsule (100 mg total) by mouth 3 (three) times daily as needed., Disp: 20 capsule, Rfl: 0   betamethasone valerate (VALISONE) 0.1 % cream, Apply 1  Application topically daily as needed (foor rash)., Disp: , Rfl:    DULoxetine (CYMBALTA) 60 MG capsule, Take 60 mg by mouth daily., Disp: , Rfl:    esomeprazole (NEXIUM) 40 MG capsule, Take 1 capsule by mouth once daily, Disp: 90 capsule, Rfl: 0   fluticasone (FLONASE) 50 MCG/ACT nasal spray, Place 2 sprays into both nostrils daily., Disp: 16 g, Rfl: 2   hydrochlorothiazide (MICROZIDE) 12.5 MG capsule, Take 1 capsule (12.5 mg total) by mouth daily. For BP >140/90 or leg swelling, Disp: 90 capsule, Rfl: 3   HYDROcodone-acetaminophen (NORCO) 7.5-325 MG tablet, Take 1 tablet by mouth every 6 (six) hours as needed for moderate pain., Disp: , Rfl:    levothyroxine (SYNTHROID) 25 MCG tablet, Take 1 tablet (25 mcg total) by mouth daily., Disp: 90 tablet, Rfl: 3   meloxicam (MOBIC) 15 MG tablet, Take 1 tablet (15 mg total) by mouth daily., Disp: 30 tablet, Rfl: 0   pregabalin (LYRICA) 75 MG capsule, Take 1 capsule (75 mg total) by mouth 2 (two) times daily., Disp: 60 capsule, Rfl: 5   promethazine-dextromethorphan (PROMETHAZINE-DM) 6.25-15 MG/5ML syrup, Take 2.5 mLs by mouth 4 (four) times daily as needed for cough., Disp: 118 mL, Rfl: 0   torsemide (DEMADEX) 20 MG tablet, TAKE 1 TABLET BY MOUTH ONCE DAILY. START WITH 1 TABLET AND REDURE TO 1/2 (ONE-HALF) TABLET AS DIRECTED, Disp: , Rfl:  Social History   Socioeconomic History   Marital status:  Divorced    Spouse name: Not on file   Number of children: 3   Years of education: 29   Highest education level: High school graduate  Occupational History   Not on file  Tobacco Use   Smoking status: Former    Packs/day: 0.50    Years: 10.00    Additional pack years: 0.00    Total pack years: 5.00    Types: Cigarettes    Quit date: 06/02/2020    Years since quitting: 1.9    Passive exposure: Past   Smokeless tobacco: Never  Vaping Use   Vaping Use: Never used  Substance and Sexual Activity   Alcohol use: Not Currently   Drug use: Not Currently    Sexual activity: Not Currently    Birth control/protection: Surgical  Other Topics Concern   Not on file  Social History Narrative   Right handed   Caffeine use: no tea, Coffee-tries to drink decaf, diet sodas   Mother also sees me, [employer] Strategy [employer]   Social Determinants of Health   Financial Resource Strain: Not on file  Food Insecurity: Not on file  Transportation Needs: Not on file  Physical Activity: Not on file  Stress: Not on file  Social Connections: Not on file  Intimate Partner Violence: Not on file   Family History  Problem Relation Age of Onset   Diabetes Mother    Liver disease Mother    COPD Father    Diabetes Father    Heart disease Maternal Grandmother    Cancer Maternal Grandfather    Cancer Paternal Grandmother    Diabetes Paternal Grandfather     Objective: Office vital signs reviewed. BP 126/82   Pulse 92   Temp 98.3 F (36.8 C)   Ht 5\' 3"  (1.6 m)   Wt 278 lb (126.1 kg)   SpO2 95%   BMI 49.25 kg/m   Physical Examination:  General: Awake, alert, morbidly obese, No acute distress HEENT: No exophthalmos.  No goiter Cardio: regular rate and rhythm, S1S2 heard, no murmurs appreciated Pulm: clear to auscultation bilaterally, no wheezes, rhonchi or rales; normal work of breathing on room air MSK: Ambulating independently Neuro: No tremor.  Alert and oriented.  Assessment/ Plan: 47 y.o. female   Subclinical hypothyroidism - Plan: TSH, T4, Free  Morbid obesity (HCC) - Plan: Bayer DCA Hb A1c Waived, CMP14+EGFR  Primary hypertension - Plan: CMP14+EGFR  Seasonal allergic rhinitis due to pollen - Plan: montelukast (SINGULAIR) 10 MG tablet  Gastroesophageal reflux disease without esophagitis - Plan: esomeprazole (NEXIUM) 40 MG capsule  Check thyroid levels, renal function given intermittent use of torsemide.  Check A1c given morbid obesity.  Blood pressure is controlled.  No changes.  Trial of Singulair.  I reviewed with the reading on that  chest x-ray that demonstrated no evidence of pneumonia.  At this time I do not think that oral antibiotics are warranted but we discussed signs and symptoms which would warrant oral antibiotics and she will message me if this occurs.  Renew PPI as GERD also could precipitate this cough and mucus production.  No orders of the defined types were placed in this encounter.  No orders of the defined types were placed in this encounter.    Raliegh Ip, DO [facility] [phone]

## 2022-05-04 LAB — CMP14+EGFR
ALT: 19 IU/L (ref 0–32)
AST: 22 IU/L (ref 0–40)
Albumin/Globulin Ratio: 1.3 (ref 1.2–2.2)
Albumin: 3.8 g/dL — ABNORMAL LOW (ref 3.9–4.9)
Alkaline Phosphatase: 84 IU/L (ref 44–121)
BUN/Creatinine Ratio: 22 (ref 9–23)
BUN: 16 mg/dL (ref 6–24)
Bilirubin Total: 0.3 mg/dL (ref 0.0–1.2)
CO2: 22 mmol/L (ref 20–29)
Calcium: 9.2 mg/dL (ref 8.7–10.2)
Chloride: 101 mmol/L (ref 96–106)
Creatinine, Ser: 0.73 mg/dL (ref 0.57–1.00)
Globulin, Total: 2.9 g/dL (ref 1.5–4.5)
Glucose: 91 mg/dL (ref 70–99)
Potassium: 4.1 mmol/L (ref 3.5–5.2)
Sodium: 139 mmol/L (ref 134–144)
Total Protein: 6.7 g/dL (ref 6.0–8.5)
eGFR: 103 mL/min/{1.73_m2} (ref >59)

## 2022-05-04 LAB — TSH: TSH: 1.21 u[IU]/mL (ref 0.450–4.500)

## 2022-05-04 LAB — T4, FREE: Free T4: 0.92 ng/dL (ref 0.82–1.77)

## 2022-05-08 ENCOUNTER — Other Ambulatory Visit (HOSPITAL_COMMUNITY): Payer: Self-pay | Admitting: Adult Medicine

## 2022-05-08 DIAGNOSIS — E038 Other specified hypothyroidism: Secondary | ICD-10-CM

## 2022-05-19 ENCOUNTER — Ambulatory Visit (HOSPITAL_COMMUNITY): Admission: RE | Admit: 2022-05-19 | Payer: Medicaid Other | Source: Ambulatory Visit

## 2022-05-19 ENCOUNTER — Encounter (HOSPITAL_COMMUNITY): Payer: Self-pay

## 2022-05-30 DIAGNOSIS — Z6841 Body Mass Index (BMI) 40.0 and over, adult: Secondary | ICD-10-CM | POA: Insufficient documentation

## 2022-06-01 ENCOUNTER — Other Ambulatory Visit: Payer: Self-pay | Admitting: Neurology

## 2022-06-06 ENCOUNTER — Encounter: Payer: Self-pay | Admitting: Family Medicine

## 2022-06-06 ENCOUNTER — Ambulatory Visit: Payer: Medicaid Other | Admitting: Family Medicine

## 2022-06-06 VITALS — BP 149/96 | HR 97 | Temp 98.8°F | Ht 63.0 in | Wt 276.0 lb

## 2022-06-06 DIAGNOSIS — H6993 Unspecified Eustachian tube disorder, bilateral: Secondary | ICD-10-CM | POA: Diagnosis not present

## 2022-06-06 DIAGNOSIS — F19939 Other psychoactive substance use, unspecified with withdrawal, unspecified: Secondary | ICD-10-CM

## 2022-06-06 NOTE — Progress Notes (Signed)
Subjective: CC:?vertigo PCP: Selena Ip, DO BJY:Selena Howard is a 47 y.o. female presenting to clinic today for:  1.  Brain zaps Patient reports that she has been out of her Cymbalta for about 4 days now.  There is been some type of hold-up at the pharmacy where she is not getting her medication.  She does not have overt vertigo, nystagmus and no falls.  She has been having some weird sensations in her head that radiate down  2.  Bilateral ear pain Patient reports occasional sharp bilateral ear pain that comes and goes abruptly.  This has been independent of aforementioned issue.  No drainage.  ROS: Per HPI  Allergies  Allergen Reactions   Keflex [Cephalexin] Nausea And Vomiting   Past Medical History:  Diagnosis Date   Anxiety    Arthritis    Endometriosis    now s/p partial hysterectomy 06/2020   GERD (gastroesophageal reflux disease)    Osteoarthritis    Spinal stenosis at L4-L5 level     Current Outpatient Medications:    betamethasone valerate (VALISONE) 0.1 % cream, Apply 1 Application topically daily as needed (foor rash)., Disp: , Rfl:    DULoxetine (CYMBALTA) 60 MG capsule, Take 1 capsule by mouth once daily, Disp: 90 capsule, Rfl: 1   esomeprazole (NEXIUM) 40 MG capsule, Take 1 capsule (40 mg total) by mouth daily., Disp: 90 capsule, Rfl: 3   fluticasone (FLONASE) 50 MCG/ACT nasal spray, Place 2 sprays into both nostrils daily., Disp: 16 g, Rfl: 2   HYDROcodone-acetaminophen (NORCO) 7.5-325 MG tablet, Take 1 tablet by mouth every 6 (six) hours as needed for moderate pain., Disp: , Rfl:    levothyroxine (SYNTHROID) 25 MCG tablet, Take 1 tablet (25 mcg total) by mouth daily., Disp: 90 tablet, Rfl: 3   meloxicam (MOBIC) 15 MG tablet, Take 1 tablet (15 mg total) by mouth daily., Disp: 30 tablet, Rfl: 0   montelukast (SINGULAIR) 10 MG tablet, Take 1 tablet (10 mg total) by mouth at bedtime., Disp: 90 tablet, Rfl: 3   torsemide (DEMADEX) 20 MG tablet, TAKE 1  TABLET BY MOUTH ONCE DAILY. START WITH 1 TABLET AND REDURE TO 1/2 (ONE-HALF) TABLET AS DIRECTED, Disp: , Rfl:  Social History   Socioeconomic History   Marital status: Divorced    Spouse name: Not on file   Number of children: 3   Years of education: 12   Highest education level: High school graduate  Occupational History   Not on file  Tobacco Use   Smoking status: Former    Packs/day: 0.50    Years: 10.00    Additional pack years: 0.00    Total pack years: 5.00    Types: Cigarettes    Quit date: 06/02/2020    Years since quitting: 2.0    Passive exposure: Past   Smokeless tobacco: Never  Vaping Use   Vaping Use: Never used  Substance and Sexual Activity   Alcohol use: Not Currently   Drug use: Not Currently   Sexual activity: Not Currently    Birth control/protection: Surgical  Other Topics Concern   Not on file  Social History Narrative   Right handed   Caffeine use: no tea, Coffee-tries to drink decaf, diet sodas   Mother also sees me, Selena Howard   Social Determinants of Health   Financial Resource Strain: Not on file  Food Insecurity: Not on file  Transportation Needs: Not on file  Physical Activity: Not on file  Stress: Not  on file  Social Connections: Not on file  Intimate Partner Violence: Not on file   Family History  Problem Relation Age of Onset   Diabetes Mother    Liver disease Mother    COPD Father    Diabetes Father    Heart disease Maternal Grandmother    Cancer Maternal Grandfather    Cancer Paternal Grandmother    Diabetes Paternal Grandfather     Objective: Office vital signs reviewed. BP (!) 149/96   Pulse 97   Temp 98.8 F (37.1 C)   Ht 5\' 3"  (1.6 m)   Wt 276 lb (125.2 kg)   SpO2 95%   BMI 48.89 kg/m   Physical Examination:  General: Awake, alert, morbidly obese, No acute distress HEENT: Normal    Neck: No masses palpated. No lymphadenopathy    Ears: Tympanic membranes intact, normal light reflex, no erythema, no  bulging    Eyes: PERRLA, extraocular membranes intact, sclera white    Nose: nasal turbinates moist, clear nasal discharge    Throat: moist mucus membranes, no erythema, no tonsillar exudate.  Airway is patent Neuro: No nystagmus.  Cranial nerves II through XII grossly intact.  No focal neurologic deficits  Assessment/ Plan: 47 y.o. female   Withdrawal from other psychoactive substance (HCC)  Eustachian tube dysfunction, bilateral  I certainly think that she is having acute withdrawal from Cymbalta.  The brain zaps that she describes is very typical of withdrawal from the substance.  Likely, her pharmacy has this available for her to pick up this afternoon.  Hopefully will see resolution as she is back on her medication  Suspect eustachian tube dysfunction.  No evidence of infection.  Discussed OMT maneuver for eustachian tube drainage as well as advised her to consider oral antihistamine  No orders of the defined types were placed in this encounter.  No orders of the defined types were placed in this encounter.    Selena Ip, DO [facility] [phone]

## 2022-06-06 NOTE — Patient Instructions (Signed)
Eustachian Tube Dysfunction  Eustachian tube dysfunction refers to a condition in which a blockage develops in the narrow passage that connects the middle ear to the back of the nose (eustachian tube). The eustachian tube regulates air pressure in the middle ear by letting air move between the ear and nose. It also helps to drain fluid from the middle ear space. Eustachian tube dysfunction can affect one or both ears. When the eustachian tube does not function properly, air pressure, fluid, or both can build up in the middle ear. What are the causes? This condition occurs when the eustachian tube becomes blocked or cannot open normally. Common causes of this condition include: Ear infections. Colds and other infections that affect the nose, mouth, and throat (upper respiratory tract). Allergies. Irritation from cigarette smoke. Irritation from stomach acid coming up into the esophagus (gastroesophageal reflux). The esophagus is the part of the body that moves food from the mouth to the stomach. Sudden changes in air pressure, such as from descending in an airplane or scuba diving. Abnormal growths in the nose or throat, such as: Growths that line the nose (nasal polyps). Abnormal growth of cells (tumors). Enlarged tissue at the back of the throat (adenoids). What increases the risk? You are more likely to develop this condition if: You smoke. You are overweight. You are a child who has: Certain birth defects of the mouth, such as cleft palate. Large tonsils or adenoids. What are the signs or symptoms? Common symptoms of this condition include: A feeling of fullness in the ear. Ear pain. Clicking or popping noises in the ear. Ringing in the ear (tinnitus). Hearing loss. Loss of balance. Dizziness. Symptoms may get worse when the air pressure around you changes, such as when you travel to an area of high elevation, fly on an airplane, or go scuba diving. How is this diagnosed? This  condition may be diagnosed based on: Your symptoms. A physical exam of your ears, nose, and throat. Tests, such as those that measure: The movement of your eardrum. Your hearing (audiometry). How is this treated? Treatment depends on the cause and severity of your condition. In mild cases, you may relieve your symptoms by moving air into your ears. This is called "popping the ears." In more severe cases, or if you have symptoms of fluid in your ears, treatment may include: Medicines to relieve congestion (decongestants). Medicines that treat allergies (antihistamines). Nasal sprays or ear drops that contain medicines that reduce swelling (steroids). A procedure to drain the fluid in your eardrum. In this procedure, a small tube may be placed in the eardrum to: Drain the fluid. Restore the air in the middle ear space. A procedure to insert a balloon device through the nose to inflate the opening of the eustachian tube (balloon dilation). Follow these instructions at home: Lifestyle Do not do any of the following until your health care provider approves: Travel to high altitudes. Fly in airplanes. Work in a Estate agent or room. Scuba dive. Do not use any products that contain nicotine or tobacco. These products include cigarettes, chewing tobacco, and vaping devices, such as e-cigarettes. If you need help quitting, ask your health care provider. Keep your ears dry. Wear fitted earplugs during showering and bathing. Dry your ears completely after. General instructions Take over-the-counter and prescription medicines only as told by your health care provider. Use techniques to help pop your ears as recommended by your health care provider. These may include: Chewing gum. Yawning. Frequent, forceful swallowing.  Closing your mouth, holding your nose closed, and gently blowing as if you are trying to blow air out of your nose. Keep all follow-up visits. This is important. Contact a  health care provider if: Your symptoms do not go away after treatment. Your symptoms come back after treatment. You are unable to pop your ears. You have: A fever. Pain in your ear. Pain in your head or neck. Fluid draining from your ear. Your hearing suddenly changes. You become very dizzy. You lose your balance. Get help right away if: You have a sudden, severe increase in any of your symptoms. Summary Eustachian tube dysfunction refers to a condition in which a blockage develops in the eustachian tube. It can be caused by ear infections, allergies, inhaled irritants, or abnormal growths in the nose or throat. Symptoms may include ear pain or fullness, hearing loss, or ringing in the ears. Mild cases are treated with techniques to unblock the ears, such as yawning or chewing gum. More severe cases are treated with medicines or procedures. This information is not intended to replace advice given to you by your health care provider. Make sure you discuss any questions you have with your health care provider. Document Revised: 03/01/2020 Document Reviewed: 03/01/2020 Elsevier Patient Education  2024 [employer].

## 2022-06-15 NOTE — Progress Notes (Deleted)
Cardiology Office Note    Date:  06/15/2022   ID:  Selena Howard, DOB 03-16-1975, MRN [id number]  PCP:  Raliegh Ip, DO  Cardiologist:  None  Electrophysiologist:  None   Chief Complaint: Palpitations   History of Present Illness:   Selena Howard is a 47 y.o. female with history of anxiety, arthritis, obesity, OSA, hypothyroidism, endometriosis, GERD, arthritis, former tobacco abuse who is seen for palpitations. She remotely saw Dr. Eden Emms for this in 2022 for palpitations felt benign. 2D echo 01/2021 showed EF 60-65%, no significant abnormalities. She also had normal ABIs in 2021.  Mg, cbc, electrolytes  OSA, who is managing ?  Palpitations   Palpitations: Seen by Dr. Eden Emms in 2022 for palpitations that were felt to be bening. Her 2D echo 01/2021 showed EF 60-65%, no significant abnormalities.   Labwork independently reviewed: 05/2022 K 4.1, Cr 0.73, albumin 3.8, LFTs otherwise normal, TSH wnl, A1c 5.5 05/2021 CBC wnl  Past History   Past Medical History:  Diagnosis Date   Anxiety    Arthritis    Endometriosis    now s/p partial hysterectomy 06/2020   GERD (gastroesophageal reflux disease)    Osteoarthritis    Spinal stenosis at L4-L5 level     Past Surgical History:  Procedure Laterality Date   ABDOMINAL HYSTERECTOMY     partial, ovary sparing   CESAREAN SECTION     COLONOSCOPY WITH PROPOFOL N/A 08/17/2021   Procedure: COLONOSCOPY WITH PROPOFOL;  Surgeon: Dolores Frame, MD;  [facility]: [facility];  Service: Gastroenterology;  Laterality: N/A;  915   HEMOSTASIS CLIP PLACEMENT  08/17/2021   Procedure: HEMOSTASIS CLIP PLACEMENT;  Surgeon: Dolores Frame, MD;  Location: [facility];  Service: Gastroenterology;;   POLYPECTOMY  08/17/2021   Procedure: POLYPECTOMY;  Surgeon: Marguerita Merles, Reuel Boom, MD;  Location: [facility];  Service: Gastroenterology;;   SUBMUCOSAL LIFTING INJECTION  08/17/2021   Procedure: SUBMUCOSAL LIFTING  INJECTION;  Surgeon: Marguerita Merles, Reuel Boom, MD;  Location: [facility];  Service: Gastroenterology;;   SUBMUCOSAL TATTOO INJECTION  08/17/2021   Procedure: SUBMUCOSAL TATTOO INJECTION;  Surgeon: Marguerita Merles, Reuel Boom, MD;  Location: [facility];  Service: Gastroenterology;;   WISDOM TOOTH EXTRACTION Bilateral     Current Medications: No outpatient medications have been marked as taking for the 06/19/22 encounter (Appointment) with Laurann Montana, PA-C.   ***   Allergies:   Keflex [cephalexin]   Social History   Socioeconomic History   Marital status: Divorced    Spouse name: Not on file   Number of children: 3   Years of education: 12   Highest education level: High school graduate  Occupational History   Not on file  Tobacco Use   Smoking status: Former    Packs/day: 0.50    Years: 10.00    Additional pack years: 0.00    Total pack years: 5.00    Types: Cigarettes    Quit date: 06/02/2020    Years since quitting: 2.0    Passive exposure: Past   Smokeless tobacco: Never  Vaping Use   Vaping Use: Never used  Substance and Sexual Activity   Alcohol use: Not Currently   Drug use: Not Currently   Sexual activity: Not Currently    Birth control/protection: Surgical  Other Topics Concern   Not on file  Social History Narrative   Right handed   Caffeine use: no tea, Coffee-tries to drink decaf, diet sodas   Mother also sees me,  Anne Shutter   Social Determinants of Health   Financial Resource Strain: Not on file  Food Insecurity: Not on file  Transportation Needs: Not on file  Physical Activity: Not on file  Stress: Not on file  Social Connections: Not on file     Family History:  The patient's ***family history includes COPD in her father; Cancer in her maternal grandfather and paternal grandmother; Diabetes in her father, mother, and paternal grandfather; Heart disease in her maternal grandmother; Liver disease in her mother.  ROS:   Please see the  history of present illness. Otherwise, review of systems is positive for ***.  All other systems are reviewed and otherwise negative.    EKG(s)/Additional Testing   EKG:  EKG is ordered today, personally reviewed, demonstrating ***  CV Studies: Cardiac studies reviewed are outlined and summarized above. Otherwise please see EMR for full report.  Recent Labs: 05/03/2022: ALT 19; BUN 16; Creatinine, Ser 0.73; Potassium 4.1; Sodium 139; TSH 1.210  Recent Lipid Panel    Component Value Date/Time   CHOL 147 11/30/2020 0838   TRIG 86 11/30/2020 0838   HDL 51 11/30/2020 0838   CHOLHDL 2.9 11/30/2020 0838   LDLCALC 80 11/30/2020 0838    PHYSICAL EXAM:    VS:  There were no vitals taken for this visit.  BMI: There is no height or weight on file to calculate BMI.  GEN: Well nourished, well developed female in no acute distress HEENT: normocephalic, atraumatic Neck: no JVD, carotid bruits, or masses Cardiac: ***RRR; no murmurs, rubs, or gallops, no edema  Respiratory:  clear to auscultation bilaterally, normal work of breathing GI: soft, nontender, nondistended, + BS MS: no deformity or atrophy Skin: warm and dry, no rash Neuro:  Alert and Oriented x 3, Strength and sensation are intact, follows commands Psych: euthymic mood, full affect  Wt Readings from Last 3 Encounters:  06/06/22 276 lb (125.2 kg)  05/03/22 278 lb (126.1 kg)  04/03/22 281 lb (127.5 kg)     ASSESSMENT & PLAN:   ***     Disposition: F/u with ***   Medication Adjustments/Labs and Tests Ordered: Current medicines are reviewed at length with the patient today.  Concerns regarding medicines are outlined above. Medication changes, Labs and Tests ordered today are summarized above and listed in the Patient Instructions accessible in Encounters.   Signed, Laurann Montana, PA-C  06/15/2022 8:00 AM    [facility] Phone: [phone]; Fax: [phone]

## 2022-06-19 ENCOUNTER — Ambulatory Visit: Payer: Medicaid Other | Admitting: Physician Assistant

## 2022-06-19 ENCOUNTER — Telehealth: Payer: Self-pay | Admitting: Cardiovascular Disease

## 2022-06-19 DIAGNOSIS — R002 Palpitations: Secondary | ICD-10-CM

## 2022-06-19 NOTE — Telephone Encounter (Signed)
FYI--Patient is scheduled to have a sastic sleeve on  06/22/22. Per patient, requesting office faxed clearance request to the office this morning.

## 2022-06-19 NOTE — Telephone Encounter (Signed)
   Pre-operative Risk Assessment    Patient Name: Selena Howard  DOB: [birth year] MRN: [id number]   I S/W Alanda Slim AT DR. DAHSER'S OFFICE AND GAVE UPDATE AS THE PT HAS NOT BEEN SEEN SINCE 2022 AND WILL NEED AN IN OFFICE APPT.    Request for Surgical Clearance    Procedure:   GASTRIC SLEEVE  Date of Surgery:  Clearance 06/22/22 (I S/W NIKESHA AT DR. DAHSER's OFFICE; PER NIKESHA SURGERY IS NOT SCHEDULED YET, THE 06/22/22 IS JUST AN APPT WITH THE SURGEON)                            Surgeon: DR. Pearletha Alfred Surgeon's Group or Practice Name:  [facility] Phone number:  [phone] Fax number:  [phone]   Type of Clearance Requested:   - Medical ; NO MEDICATIONS LISTED AS NEEDING TO BE HELD   Type of Anesthesia:  General    Additional requests/questions:    Elpidio Anis   06/19/2022, 12:04 PM

## 2022-06-19 NOTE — Telephone Encounter (Signed)
   Name: Selena Howard  DOB: 06/02/1975  MRN: [id number]  Primary Cardiologist: None  Chart reviewed as part of pre-operative protocol coverage. Because of Selena Howard's past medical history and time since last visit, she will require a follow-up in-office visit in order to better assess preoperative cardiovascular risk.  Patient has not been seen in clinic since 2022.  Pre-op covering staff: - Please schedule appointment and call patient to inform them. If patient already had an upcoming appointment within acceptable timeframe, please add "pre-op clearance" to the appointment notes so provider is aware. - Please contact requesting surgeon's office via preferred method (i.e, phone, fax) to inform them of need for appointment prior to surgery.   Joylene Grapes, NP  06/19/2022, 11:39 AM

## 2022-06-19 NOTE — Telephone Encounter (Signed)
I s/w the pt about appt for pre op clearance. Pt was last seen by cardiology 2022. Pt did have an appt today which she thought was in our [place] office. Pt was not aware appt was here in the [facility] Location with Ronie Spies, PAC, that appt was cancelled. In speaking with the pt we decided to schedule appt in [place] so she could get in as ASAP. Appt has been made with Robin Searing, NP 06/23/22 @ 8:50 at [facility]. Pt has been given address for [facility] [address] Pt is grateful for my help. I will update all parties involved.

## 2022-06-22 ENCOUNTER — Other Ambulatory Visit: Payer: Self-pay

## 2022-06-22 DIAGNOSIS — E039 Hypothyroidism, unspecified: Secondary | ICD-10-CM

## 2022-06-22 NOTE — Progress Notes (Signed)
Office Visit    Patient Name: Selena Howard Date of Encounter: 06/23/2022  Primary Care Provider:  Raliegh Ip, DO Primary Cardiologist:  None Primary Electrophysiologist: None   Past Medical History    Past Medical History:  Diagnosis Date   Anxiety    Arthritis    Endometriosis    now s/p partial hysterectomy 06/2020   GERD (gastroesophageal reflux disease)    Osteoarthritis    Spinal stenosis at L4-L5 level    Past Surgical History:  Procedure Laterality Date   ABDOMINAL HYSTERECTOMY     partial, ovary sparing   CESAREAN SECTION     COLONOSCOPY WITH PROPOFOL N/A 08/17/2021   Procedure: COLONOSCOPY WITH PROPOFOL;  Surgeon: Dolores Frame, MD;  [facility];  Service: Gastroenterology;  Laterality: N/A;  915   HEMOSTASIS CLIP PLACEMENT  08/17/2021   Procedure: HEMOSTASIS CLIP PLACEMENT;  Surgeon: Dolores Frame, MD;  [facility];  Service: Gastroenterology;;   POLYPECTOMY  08/17/2021   Procedure: POLYPECTOMY;  Surgeon: Dolores Frame, MD;  [facility];  Service: Gastroenterology;;   SUBMUCOSAL LIFTING INJECTION  08/17/2021   Procedure: SUBMUCOSAL LIFTING INJECTION;  Surgeon: Marguerita Merles, Reuel Boom, MD;  [facility];  Service: Gastroenterology;;   SUBMUCOSAL TATTOO INJECTION  08/17/2021   Procedure: SUBMUCOSAL TATTOO INJECTION;  Surgeon: Dolores Frame, MD;  [facility];  Service: Gastroenterology;;   WISDOM TOOTH EXTRACTION Bilateral     Allergies  Allergies  Allergen Reactions   Keflex [Cephalexin] Nausea And Vomiting     History of Present Illness    Selena Howard  is a 47 year old female with a PMH of palpitations, DM type II, anxiety, GERD obesity who presents today for surgical clearance.   Ms. Wollin was seen initially by Dr.Nishan on 12/08/2020 for complaint of palpitations.  She underwent TTE that showed normal EF of 60-65% with no RWMA and  normal valve function.  She has not been seen since 2022 and presents today for surgical clearance for upcoming gastric sleeve procedure.  Ms. Malachi presents today for preoperative clearance.  Since last being seen in the office patient reports she has been doing well from a cardiac perspective and reports decrease in her palpitations since previous visit.  Her blood pressure today is controlled at 120/80 and heart rate is stable at 78 bpm.  She is compliant with her current medications and denies any adverse reactions.  She appears euvolemic on exam however difficult to ascertain due to body habitus.  She is currently on diuretics due to history of edema.  She is scheduled to have gastric sleeve procedure.  Patient denies chest pain, palpitations, dyspnea, PND, orthopnea, nausea, vomiting, dizziness, syncope, edema, weight gain, or early satiety.   Home Medications    Current Outpatient Medications  Medication Sig Dispense Refill   betamethasone valerate (VALISONE) 0.1 % cream Apply 1 Application topically daily as needed (foor rash).     DULoxetine (CYMBALTA) 60 MG capsule Take 1 capsule by mouth once daily 90 capsule 1   esomeprazole (NEXIUM) 40 MG capsule Take 1 capsule (40 mg total) by mouth daily. 90 capsule 3   fluticasone (FLONASE) 50 MCG/ACT nasal spray Place 2 sprays into both nostrils daily. 16 g 2   HYDROcodone-acetaminophen (NORCO) 7.5-325 MG tablet Take 1 tablet by mouth every 6 (six) hours as needed for moderate pain.     levothyroxine (SYNTHROID) 25 MCG tablet Take 1 tablet (25 mcg total)  by mouth daily. 90 tablet 3   meloxicam (MOBIC) 15 MG tablet Take 1 tablet (15 mg total) by mouth daily. 30 tablet 0   montelukast (SINGULAIR) 10 MG tablet Take 1 tablet (10 mg total) by mouth at bedtime. 90 tablet 3   torsemide (DEMADEX) 20 MG tablet TAKE 1 TABLET BY MOUTH ONCE DAILY. START WITH 1 TABLET AND REDURE TO 1/2 (ONE-HALF) TABLET AS DIRECTED     No current facility-administered  medications for this visit.     Review of Systems  Please see the history of present illness.    (+) Palpitations   All other systems reviewed and are otherwise negative except as noted above.  Physical Exam    Wt Readings from Last 3 Encounters:  06/23/22 270 lb (122.5 kg)  06/06/22 276 lb (125.2 kg)  05/03/22 278 lb (126.1 kg)   VS: Vitals:   06/23/22 0853  BP: 120/80  Pulse: 78  SpO2: 95%  ,Body mass index is 47.83 kg/m.  Constitutional:      Appearance: Healthy appearance. Not in distress.  Neck:     Vascular: JVD normal.  Pulmonary:     Effort: Pulmonary effort is normal.     Breath sounds: No wheezing. No rales. Diminished in the bases Cardiovascular:     Normal rate. Regular rhythm. Normal S1. Normal S2.      Murmurs: There is no murmur.  Edema:    Peripheral edema absent.  Abdominal:     Palpations: Abdomen is soft non tender. There is no hepatomegaly.  Skin:    General: Skin is warm and dry.  Neurological:     General: No focal deficit present.     Mental Status: Alert and oriented to person, place and time.     Cranial Nerves: Cranial nerves are intact.  EKG/LABS/ Recent Cardiac Studies    ECG personally reviewed by me today -sinus rhythm with rate of 75 bpm and no acute changes consistent with previous EKG.  Cardiac Studies & Procedures       ECHOCARDIOGRAM  ECHOCARDIOGRAM COMPLETE 01/18/2021  Narrative ECHOCARDIOGRAM REPORT    Patient Name:   Selena Howard Date of Exam: 01/18/2021 Medical Rec #:  [id number]      Height:       63.0 in Accession #:    [id number]     Weight:       248.0 lb Date of Birth:  [birth year]     BSA:          2.119 m Patient Age:    47 years       BP:           133/80 mmHg Patient Gender: F              HR:           74 bpm. [facility]  Procedure: 2D Echo, Cardiac Doppler and Color Doppler  Indications:    R00.2 (ICD-10-CM) - Palpitations  History:        Patient has no prior history of  Echocardiogram examinations. Risk Factors:Former Smoker. Hx of COVID-19.  Sonographer:    Celesta Gentile RCS Referring Phys: [id number] [address] Stade  IMPRESSIONS   1. Left ventricular ejection fraction, by estimation, is 60 to 65%. The left ventricle has normal function. The left ventricle has no regional wall motion abnormalities. Left ventricular diastolic parameters were normal. 2. Right ventricular systolic function is normal. The right ventricular size is normal. Tricuspid regurgitation signal  is inadequate for assessing PA pressure. 3. The mitral valve is normal in structure. No evidence of mitral valve regurgitation. No evidence of mitral stenosis. 4. The aortic valve is tricuspid. Aortic valve regurgitation is not visualized. No aortic stenosis is present. 5. The inferior vena cava is normal in size with greater than 50% respiratory variability, suggesting right atrial pressure of 3 mmHg.  FINDINGS Left Ventricle: Left ventricular ejection fraction, by estimation, is 60 to 65%. The left ventricle has normal function. The left ventricle has no regional wall motion abnormalities. The left ventricular internal cavity size was normal in size. There is no left ventricular hypertrophy. Left ventricular diastolic parameters were normal.  Right Ventricle: The right ventricular size is normal. No increase in right ventricular wall thickness. Right ventricular systolic function is normal. Tricuspid regurgitation signal is inadequate for assessing PA pressure.  Left Atrium: Left atrial size was normal in size.  Right Atrium: Right atrial size was normal in size.  Pericardium: There is no evidence of pericardial effusion.  Mitral Valve: The mitral valve is normal in structure. No evidence of mitral valve regurgitation. No evidence of mitral valve stenosis.  Tricuspid Valve: The tricuspid valve is normal in structure. Tricuspid valve regurgitation is not demonstrated. No evidence of tricuspid  stenosis.  Aortic Valve: The aortic valve is tricuspid. Aortic valve regurgitation is not visualized. No aortic stenosis is present. Aortic valve mean gradient measures 4.7 mmHg. Aortic valve peak gradient measures 9.2 mmHg. Aortic valve area, by VTI measures 2.51 cm.  Pulmonic Valve: The pulmonic valve was not well visualized. Pulmonic valve regurgitation is not visualized. No evidence of pulmonic stenosis.  Aorta: The aortic root is normal in size and structure.  Venous: The inferior vena cava is normal in size with greater than 50% respiratory variability, suggesting right atrial pressure of 3 mmHg.  IAS/Shunts: No atrial level shunt detected by color flow Doppler.   LEFT VENTRICLE PLAX 2D LVIDd:         4.40 cm   Diastology LVIDs:         2.70 cm   LV e' medial:    10.20 cm/s LV PW:         0.70 cm   LV E/e' medial:  10.6 LV IVS:        0.80 cm   LV e' lateral:   11.60 cm/s LVOT diam:     2.00 cm   LV E/e' lateral: 9.3 LV SV:         85 LV SV Index:   40 LVOT Area:     3.14 cm   RIGHT VENTRICLE RV S prime:     10.80 cm/s TAPSE (M-mode): 2.7 cm  LEFT ATRIUM             Index        RIGHT ATRIUM           Index LA diam:        3.80 cm 1.79 cm/m   RA Area:     13.30 cm LA Vol (A2C):   43.2 ml 20.39 ml/m  RA Volume:   27.60 ml  13.03 ml/m LA Vol (A4C):   39.1 ml 18.45 ml/m LA Biplane Vol: 44.5 ml 21.00 ml/m AORTIC VALVE AV Area (Vmax):    2.36 cm AV Area (Vmean):   2.29 cm AV Area (VTI):     2.51 cm AV Vmax:           152.03 cm/s AV Vmean:  102.092 cm/s AV VTI:            0.338 m AV Peak Grad:      9.2 mmHg AV Mean Grad:      4.7 mmHg LVOT Vmax:         114.00 cm/s LVOT Vmean:        74.300 cm/s LVOT VTI:          0.270 m LVOT/AV VTI ratio: 0.80  AORTA Ao Root diam: 3.10 cm  MITRAL VALVE MV Area (PHT): 3.91 cm     SHUNTS MV Decel Time: 194 msec     Systemic VTI:  0.27 m MV E velocity: 108.00 cm/s  Systemic Diam: 2.00 cm MV A velocity: 48.80  cm/s MV E/A ratio:  2.21  Dina Rich MD Electronically signed by Dina Rich MD Signature Date/Time: 01/18/2021/2:04:53 PM    Final               Lab Results  Component Value Date   WBC 5.3 05/17/2021   HGB 12.3 05/17/2021   HCT 37.8 05/17/2021   MCV 90.6 05/17/2021   PLT 259 05/17/2021   Lab Results  Component Value Date   CREATININE 0.73 05/03/2022   BUN 16 05/03/2022   NA 139 05/03/2022   K 4.1 05/03/2022   CL 101 05/03/2022   CO2 22 05/03/2022   Lab Results  Component Value Date   ALT 19 05/03/2022   AST 22 05/03/2022   ALKPHOS 84 05/03/2022   BILITOT 0.3 05/03/2022   Lab Results  Component Value Date   CHOL 147 11/30/2020   HDL 51 11/30/2020   LDLCALC 80 11/30/2020   TRIG 86 11/30/2020   CHOLHDL 2.9 11/30/2020    Lab Results  Component Value Date   HGBA1C 5.5 05/03/2022     Assessment & Plan    1.  Preoperative clearance: -Patient's RCRI score is 0.9  -The patient affirms she has been doing well without any new cardiac symptoms. They are able to achieve 7 METS without cardiac limitations. Therefore, based on ACC/AHA guidelines, the patient would be at acceptable risk for the planned procedure without further cardiovascular testing. The patient was advised that if she develops new symptoms prior to surgery to contact our office to arrange for a follow-up visit, and she verbalized understanding.   2.  Palpitations: -2D echo completed 2022 with normal EF of 60 to 65% and no valvular abnormalities. -She reports very minimal palpitations if any since follow-up.  3.  Obstructive sleep apnea: -Patient reports compliance currently.  4.  Obesity: -Patient BMI is 47.83 kg/m -Patient is scheduled to undergo gastric sleeve procedure.  Disposition: Follow-up as needed    Medication Adjustments/Labs and Tests Ordered: Current medicines are reviewed at length with the patient today.  Concerns regarding medicines are outlined above.    Signed, Napoleon Form, Leodis Rains, NP 06/23/2022, 9:43 AM [facility]

## 2022-06-23 ENCOUNTER — Other Ambulatory Visit (INDEPENDENT_AMBULATORY_CARE_PROVIDER_SITE_OTHER): Payer: Medicaid Other

## 2022-06-23 ENCOUNTER — Encounter: Payer: Self-pay | Admitting: Nurse Practitioner

## 2022-06-23 ENCOUNTER — Ambulatory Visit: Payer: Medicaid Other | Attending: Nurse Practitioner | Admitting: Nurse Practitioner

## 2022-06-23 VITALS — BP 120/80 | HR 78 | Ht 63.0 in | Wt 270.0 lb

## 2022-06-23 DIAGNOSIS — G4733 Obstructive sleep apnea (adult) (pediatric): Secondary | ICD-10-CM

## 2022-06-23 DIAGNOSIS — E039 Hypothyroidism, unspecified: Secondary | ICD-10-CM

## 2022-06-23 DIAGNOSIS — Z01810 Encounter for preprocedural cardiovascular examination: Secondary | ICD-10-CM

## 2022-06-23 DIAGNOSIS — R002 Palpitations: Secondary | ICD-10-CM | POA: Diagnosis not present

## 2022-06-23 LAB — TSH: TSH: 1.55 u[IU]/mL (ref 0.35–5.50)

## 2022-06-23 LAB — T4, FREE: Free T4: 0.84 ng/dL (ref 0.60–1.60)

## 2022-06-23 NOTE — Patient Instructions (Addendum)
Medication Instructions:  Your physician recommends that you continue on your current medications as directed. Please refer to the Current Medication list given to you today. *If you need a refill on your cardiac medications before your next appointment, please call your pharmacy*   Lab Work: NONE ORDERED   Testing/Procedures: NONE ORDERED   Follow-Up: At [facility], you and your health needs are our priority.  As part of our continuing mission to provide you with exceptional heart care, we have created designated Provider Care Teams.  These Care Teams include your primary Cardiologist (physician) and Advanced Practice Providers (APPs -  Physician Assistants and Nurse Practitioners) who all work together to provide you with the care you need, when you need it.  We recommend signing up for the patient portal called "MyChart".  Sign up information is provided on this After Visit Summary.  MyChart is used to connect with patients for Virtual Visits (Telemedicine).  Patients are able to view lab/test results, encounter notes, upcoming appointments, etc.  Non-urgent messages can be sent to your provider as well.   To learn more about what you can do with MyChart, go to [email or web address].    Your next appointment:    AS NEEDED  Provider:   Charlton Haws, MD  Other Instructions YOU ARE CLEARED FOR YOUR PROCEDURE

## 2022-06-27 ENCOUNTER — Ambulatory Visit: Payer: Medicaid Other | Admitting: Family Medicine

## 2022-06-28 ENCOUNTER — Encounter: Payer: Self-pay | Admitting: "Endocrinology

## 2022-06-28 ENCOUNTER — Ambulatory Visit: Payer: Medicaid Other | Admitting: "Endocrinology

## 2022-06-28 VITALS — BP 130/80 | HR 105 | Ht 63.0 in | Wt 272.8 lb

## 2022-06-28 DIAGNOSIS — R5382 Chronic fatigue, unspecified: Secondary | ICD-10-CM

## 2022-06-28 NOTE — Progress Notes (Signed)
Outpatient Endocrinology Note Selena [place]e, MD  06/28/22   Selena Howard [birth year] [id number]  Referring Provider: Eliezer Lofts, MD Primary Care Provider: Raliegh Ip, DO Subjective  Chief Complaint  Patient presents with   Hypothyroidism    Assessment & Plan  [name] was seen today for hypothyroidism.  Diagnoses and all orders for this visit:  Chronic fatigue  Morbid obesity (HCC)    Do not see any high TSH and low free T4 that would indicate a diagnosis of hypothyroidism. Patient reports that she is getting help from levothyroxine 25 mcg that she has been taking. Recommend holding the levothyroxine for 3 months followed by repeat blood work to see if the diagnosis was accurately made.  Patient will follow-up when she is ready to quit, likely after her surgery.   Patient is planning to get gastric sleeve bariatric surgery that is already scheduled to help her spine.  I have reviewed current medications, nurse's notes, allergies, vital signs, past medical and surgical history, family medical history, and social history for this encounter. Counseled patient on symptoms, examination findings, lab findings, imaging results, treatment decisions and monitoring and prognosis. The patient understood the recommendations and agrees with the treatment plan. All questions regarding treatment plan were fully answered.   No follow-ups on file.   Selena Lusk, MD  06/28/22   I have reviewed current medications, nurse's notes, allergies, vital signs, past medical and surgical history, family medical history, and social history for this encounter. Counseled patient on symptoms, examination findings, lab findings, imaging results, treatment decisions and monitoring and prognosis. The patient understood the recommendations and agrees with the treatment plan. All questions regarding treatment plan were fully answered.   History of Present Illness Selena Howard is a  47 y.o. year old female who presents to our clinic with hypothyroidism.  Patient reports that she was diagnosed of hypothyroidism by another provider and started on levothyroxine 25 mcg which has helped with her fatigue.  Patient has been worked up with sleep study previously and was not found to have any sleep apnea.  Patient is planning to get gastric sleeve bariatric surgery that is already scheduled to help her spine.  Physical Exam  BP 130/80   Pulse (!) 105   Ht 5\' 3"  (1.6 m)   Wt 272 lb 12.8 oz (123.7 kg)   SpO2 99%   BMI 48.32 kg/m   Physical Exam Constitutional:      Appearance: Normal appearance. Not ill-appearing.  HENT:     Head: Normocephalic and atraumatic.  Eyes:     General: No scleral icterus.    Conjunctiva/sclera: Conjunctivae normal.  Pulmonary:     Effort: Pulmonary effort is normal. No respiratory distress.  Musculoskeletal:     Cervical back: Normal range of motion. No rigidity.  Skin:    Coloration: Skin is not jaundiced.     Findings: No rash.  Neurological:     Mental Status: Alert and oriented to person, place, and time. Psychiatric:        Behavior: Behavior normal.        Judgment: Judgment normal.    Allergies Allergies  Allergen Reactions   Keflex [Cephalexin] Nausea And Vomiting    Current Medications Patient's Medications  New Prescriptions   No medications on file  Previous Medications   BETAMETHASONE VALERATE (VALISONE) 0.1 % CREAM    Apply 1 Application topically daily as needed (foor rash).   BIOTIN 1000 MCG CHEW    Chew  2 Pieces of gum by mouth daily.   CHOLECALCIFEROL (VITAMIN D3) 50 MCG (2000 UT) CHEW    Chew 2,000 Pieces of gum by mouth daily.   DULOXETINE (CYMBALTA) 60 MG CAPSULE    Take 1 capsule by mouth once daily   ESOMEPRAZOLE (NEXIUM) 40 MG CAPSULE    Take 1 capsule (40 mg total) by mouth daily.   FLUTICASONE (FLONASE) 50 MCG/ACT NASAL SPRAY    Place 2 sprays into both nostrils daily.   HYDROCODONE-ACETAMINOPHEN  (NORCO) 7.5-325 MG TABLET    Take 1 tablet by mouth every 6 (six) hours as needed for moderate pain.   LEVOTHYROXINE (SYNTHROID) 25 MCG TABLET    Take 1 tablet (25 mcg total) by mouth daily.   MELOXICAM (MOBIC) 15 MG TABLET    Take 1 tablet (15 mg total) by mouth daily.   MISC NATURAL PRODUCTS (BEET ROOT) 500 MG CAPS    Take 500 mg by mouth daily.   MONTELUKAST (SINGULAIR) 10 MG TABLET    Take 1 tablet (10 mg total) by mouth at bedtime.   TORSEMIDE (DEMADEX) 20 MG TABLET    TAKE 1 TABLET BY MOUTH ONCE DAILY. START WITH 1 TABLET AND REDURE TO 1/2 (ONE-HALF) TABLET AS DIRECTED  Modified Medications   No medications on file  Discontinued Medications   No medications on file    Past Medical History Past Medical History:  Diagnosis Date   Anxiety    Arthritis    Endometriosis    now s/p partial hysterectomy 06/2020   GERD (gastroesophageal reflux disease)    Osteoarthritis    Spinal stenosis at L4-L5 level     Past Surgical History Past Surgical History:  Procedure Laterality Date   ABDOMINAL HYSTERECTOMY     partial, ovary sparing   CESAREAN SECTION     COLONOSCOPY WITH PROPOFOL N/A 08/17/2021   Procedure: COLONOSCOPY WITH PROPOFOL;  Surgeon: Dolores Frame, MD;  [facility];  Service: Gastroenterology;  Laterality: N/A;  915   HEMOSTASIS CLIP PLACEMENT  08/17/2021   Procedure: HEMOSTASIS CLIP PLACEMENT;  Surgeon: Dolores Frame, MD;  [facility];  Service: Gastroenterology;;   POLYPECTOMY  08/17/2021   Procedure: POLYPECTOMY;  Surgeon: Dolores Frame, MD;  [facility];  Service: Gastroenterology;;   SUBMUCOSAL LIFTING INJECTION  08/17/2021   Procedure: SUBMUCOSAL LIFTING INJECTION;  Surgeon: Dolores Frame, MD;  [facility];  Service: Gastroenterology;;   SUBMUCOSAL TATTOO INJECTION  08/17/2021   Procedure: SUBMUCOSAL TATTOO INJECTION;  Surgeon: Marguerita Merles, Reuel Boom, MD;  [facility];  Service: Gastroenterology;;   WISDOM TOOTH EXTRACTION Bilateral     Family History family history includes COPD in her father; Cancer in her maternal grandfather and paternal grandmother; Diabetes in her father, mother, and paternal grandfather; Heart disease in her maternal grandmother; Liver disease in her mother.  Social History Social History   Socioeconomic History   Marital status: Divorced    Spouse name: Not on file   Number of children: 3   Years of education: 12   Highest education level: High school graduate  Occupational History   Not on file  Tobacco Use   Smoking status: Former    Packs/day: 0.50    Years: 10.00    Additional pack years: 0.00    Total pack years: 5.00    Types: Cigarettes    Quit date: 06/02/2020    Years since quitting: 2.0    Passive exposure: Past  Smokeless tobacco: Never  Vaping Use   Vaping Use: Never used  Substance and Sexual Activity   Alcohol use: Not Currently   Drug use: Not Currently   Sexual activity: Not Currently    Birth control/protection: Surgical  Other Topics Concern   Not on file  Social History Narrative   Right handed   Caffeine use: no tea, Coffee-tries to drink decaf, diet sodas   Mother also sees me, Anne Shutter   Social Determinants of Health   Financial Resource Strain: Not on file  Food Insecurity: Not on file  Transportation Needs: Not on file  Physical Activity: Not on file  Stress: Not on file  Social Connections: Not on file  Intimate Partner Violence: Not on file    Laboratory Investigations Lab Results  Component Value Date   TSH 1.55 06/23/2022   TSH 1.210 05/03/2022   TSH 1.600 01/27/2022   FREET4 0.84 06/23/2022   FREET4 0.92 05/03/2022   FREET4 0.89 01/27/2022     No results found for: "TSI"   No components found for: "TRAB"   Lab Results  Component Value Date   CHOL 147 11/30/2020   Lab Results  Component Value Date   HDL 51 11/30/2020   Lab Results  Component  Value Date   LDLCALC 80 11/30/2020   Lab Results  Component Value Date   TRIG 86 11/30/2020   Lab Results  Component Value Date   CHOLHDL 2.9 11/30/2020   Lab Results  Component Value Date   CREATININE 0.73 05/03/2022   No results found for: "GFR"    Component Value Date/Time   NA 139 05/03/2022 1236   K 4.1 05/03/2022 1236   CL 101 05/03/2022 1236   CO2 22 05/03/2022 1236   GLUCOSE 91 05/03/2022 1236   GLUCOSE 93 05/17/2021 0936   BUN 16 05/03/2022 1236   CREATININE 0.73 05/03/2022 1236   CALCIUM 9.2 05/03/2022 1236   PROT 6.7 05/03/2022 1236   ALBUMIN 3.8 (L) 05/03/2022 1236   AST 22 05/03/2022 1236   ALT 19 05/03/2022 1236   ALKPHOS 84 05/03/2022 1236   BILITOT 0.3 05/03/2022 1236   GFRNONAA >60 05/17/2021 0936      Latest Ref Rng & Units 05/03/2022   12:36 PM 01/27/2022    4:13 PM 05/17/2021    9:36 AM  BMP  Glucose 70 - 99 mg/dL 91  90  93   BUN 6 - 24 mg/dL 16  15  14    Creatinine 0.57 - 1.00 mg/dL 1.19  1.47  8.29   BUN/Creat Ratio 9 - 23 22  18     Sodium 134 - 144 mmol/L 139  142  138   Potassium 3.5 - 5.2 mmol/L 4.1  4.1  4.3   Chloride 96 - 106 mmol/L 101  102  106   CO2 20 - 29 mmol/L 22  26  30    Calcium 8.7 - 10.2 mg/dL 9.2  9.1  8.6        Component Value Date/Time   WBC 5.3 05/17/2021 0936   RBC 4.17 05/17/2021 0936   HGB 12.3 05/17/2021 0936   HGB 12.5 11/30/2020 0838   HCT 37.8 05/17/2021 0936   HCT 37.8 11/30/2020 0838   PLT 259 05/17/2021 0936   PLT 220 11/30/2020 0838   MCV 90.6 05/17/2021 0936   MCV 86 11/30/2020 0838   MCH 29.5 05/17/2021 0936   MCHC 32.5 05/17/2021 0936   RDW 15.2 05/17/2021 0936   RDW  14.4 11/30/2020 0838   LYMPHSABS 2.1 05/17/2021 0936   MONOABS 0.4 05/17/2021 0936   EOSABS 0.0 05/17/2021 0936   BASOSABS 0.0 05/17/2021 0936      Parts of this note may have been dictated using voice recognition software. There may be variances in spelling and vocabulary which are unintentional. Not all errors are proofread.  Please notify the Thereasa Parkin if any discrepancies are noted or if the meaning of any statement is not clear.

## 2022-07-12 HISTORY — PX: STOMACH SURGERY: SHX791

## 2022-07-12 HISTORY — PX: LAPAROSCOPIC GASTRIC SLEEVE RESECTION: SHX5895

## 2022-07-17 ENCOUNTER — Ambulatory Visit: Payer: Medicaid Other | Admitting: Cardiovascular Disease

## 2022-08-07 ENCOUNTER — Telehealth: Payer: Self-pay | Admitting: Family Medicine

## 2022-08-07 NOTE — Telephone Encounter (Signed)
Tried calling her myself. No answer.  Where did she do the Physical therapy for her neck at?  For some reason I cannot find 6 weeks of physical therapy notes for her neck anywhere.  We will have to document it for her insurance to approve the MRI.

## 2022-08-07 NOTE — Telephone Encounter (Signed)
Pt has done physical therapy and says that she did see any changes and is asking if Dr Nadine Counts will go ahead with the MRI. Pt is starting to have new sxs. She says that in left arm she feel like  dwell pain numbing sensation starting from her armpit and will go all the way down arm. Please call back

## 2022-08-08 NOTE — Telephone Encounter (Signed)
No answer left vm for cb

## 2022-08-24 DIAGNOSIS — Z903 Acquired absence of stomach [part of]: Secondary | ICD-10-CM | POA: Insufficient documentation

## 2022-08-31 NOTE — Telephone Encounter (Signed)
Attempted to call pt - no answer - left vm - closing call

## 2022-09-05 ENCOUNTER — Encounter: Payer: Self-pay | Admitting: Family Medicine

## 2022-09-05 ENCOUNTER — Ambulatory Visit: Payer: Medicaid Other | Admitting: Family Medicine

## 2022-09-05 VITALS — BP 118/73 | HR 70 | Temp 98.6°F | Ht 63.0 in | Wt 232.0 lb

## 2022-09-05 DIAGNOSIS — M5412 Radiculopathy, cervical region: Secondary | ICD-10-CM

## 2022-09-05 DIAGNOSIS — Z903 Acquired absence of stomach [part of]: Secondary | ICD-10-CM

## 2022-09-05 DIAGNOSIS — E038 Other specified hypothyroidism: Secondary | ICD-10-CM | POA: Insufficient documentation

## 2022-09-05 NOTE — Patient Instructions (Signed)
Cervical Radiculopathy  Cervical radiculopathy means that a nerve in the neck (a cervical nerve) is pinched or bruised. This can happen because of an injury to the cervical spine (vertebrae) in the neck, or as a normal part of getting older. This condition can cause pain or loss of feeling (numbness) that runs from your neck all the way down to your arm and fingers. Often, this condition gets better with rest. Treatment may be needed if the condition does not get better. What are the causes? A neck injury. A bulging disk in your spine. Sudden muscle tightening (muscle spasms). Tight muscles in your neck due to overuse. Arthritis. Breakdown in the bones and joints of the spine (spondylosis) due to getting older. Bone spurs that form near the nerves in the neck. What are the signs or symptoms? Pain. The pain may: Run from the neck to the arm and hand. Be very bad or irritating. Get worse when you move your neck. Loss of feeling or tingling in your arm or hand. Weakness in your arm or hand, in very bad cases. How is this treated? In many cases, treatment is not needed for this condition. With rest, the condition often gets better over time. If treatment is needed, options may include: Wearing a soft neck collar (cervical collar) for short periods of time. Doing exercises (physical therapy) to strengthen your neck muscles. Taking medicines. Having shots (injections) in your spine, in very bad cases. Having surgery. This may be needed if other treatments do not help. The type of surgery that is used will depend on the cause of your condition. Follow these instructions at home: If you have a soft neck collar: Wear it as told by your doctor. Take it off only as told by your doctor. Ask your doctor if you can take the collar off for cleaning and bathing. If you are allowed to take the collar off for cleaning or bathing: Follow instructions from your doctor about how to take off the collar  safely. Clean the collar by wiping it with mild soap and water and drying it completely. Take out any removable pads in the collar every 1-2 days. Wash them by hand with soap and water. Let them air-dry completely before you put them back in the collar. Check your skin under the collar for redness or sores. If you see any, tell your doctor. Managing pain     Take over-the-counter and prescription medicines only as told by your doctor. If told, put ice on the painful area. To do this: If you have a soft neck collar, take if off as told by your doctor. Put ice in a plastic bag. Place a towel between your skin and the bag. Leave the ice on for 20 minutes, 2-3 times a day. Take off the ice if your skin turns bright red. This is very important. If you cannot feel pain, heat, or cold, you have a greater risk of damage to the area. If using ice does not help, you can try using heat. Use the heat source that your doctor recommends, such as a moist heat pack or a heating pad. Place a towel between your skin and the heat source. Leave the heat on for 20-30 minutes. Take off the heat if your skin turns bright red. This is very important. If you cannot feel pain, heat, or cold, you have a greater risk of getting burned. You may try a gentle neck and shoulder rub (massage). Activity Rest as needed. Return  to your normal activities when your doctor says that it is safe. Do exercises as told by your doctor or physical therapist. You may have to avoid lifting. Ask your doctor how much you can safely lift. General instructions Use a flat pillow when you sleep. Do not drive while wearing a soft neck collar. If you do not have a soft neck collar, ask your doctor if it is safe to drive while your neck heals. Ask your doctor if you should avoid driving or using machines while you are taking your medicine. Do not smoke or use any products that contain nicotine or tobacco. If you need help quitting, ask your  doctor. Keep all follow-up visits. Contact a doctor if: Your condition does not get better with treatment. Get help right away if: Your pain gets worse and medicine does not help. You lose feeling or feel weak in your hand, arm, face, or leg. You have a high fever. Your neck is stiff. You cannot control when you poop or pee (have incontinence). You have trouble with walking, balance, or talking. Summary Cervical radiculopathy means that a nerve in the neck is pinched or bruised. A nerve can get pinched from a bulging disk, arthritis, an injury to the neck, or other causes. Symptoms include pain, tingling, or loss of feeling that goes from the neck to the arm or hand. Weakness in your arm or hand can happen in very bad cases. Treatment may include resting, wearing a soft neck collar, and doing exercises. You might need to take medicines for pain. In very bad cases, shots or surgery may be needed. This information is not intended to replace advice given to you by your health care provider. Make sure you discuss any questions you have with your health care provider. Document Revised: 06/24/2020 Document Reviewed: 06/24/2020 Elsevier Patient Education  2024 [employer].

## 2022-09-05 NOTE — Progress Notes (Signed)
Subjective: CC: Hypothyroidism PCP: Raliegh Ip, DO Selena Howard is a 47 y.o. female presenting to clinic today for:  1.  Hypothyroidism Patient is compliant with thyroid replacement.  She notes no tremors, changes in bowel habits.  She had gastric sleeve done in July and has lost weight from that.  She reports some improvement lower extremity edema but she still has it to some degree by the end of the day  2.  Neck pain Patient reports ongoing left-sided neck pain radiating down the left upper extremity.  She reports numbness in the axillary region and notes weakness in that left arm now.  She has completed physical therapy but did not find it especially helpful   ROS: Per HPI  Allergies  Allergen Reactions   Keflex [Cephalexin] Nausea And Vomiting   Past Medical History:  Diagnosis Date   Anxiety    Arthritis    Endometriosis    now s/p partial hysterectomy 06/2020   GERD (gastroesophageal reflux disease)    Osteoarthritis    Spinal stenosis at L4-L5 level     Current Outpatient Medications:    betamethasone valerate (VALISONE) 0.1 % cream, Apply 1 Application topically daily as needed (foor rash)., Disp: , Rfl:    Biotin 1000 MCG CHEW, Chew 2 Pieces of gum by mouth daily., Disp: , Rfl:    Cholecalciferol (VITAMIN D3) 50 MCG (2000 UT) CHEW, Chew 2,000 Pieces of gum by mouth daily., Disp: , Rfl:    DULoxetine (CYMBALTA) 60 MG capsule, Take 1 capsule by mouth once daily, Disp: 90 capsule, Rfl: 1   esomeprazole (NEXIUM) 40 MG capsule, Take 1 capsule (40 mg total) by mouth daily., Disp: 90 capsule, Rfl: 3   fluticasone (FLONASE) 50 MCG/ACT nasal spray, Place 2 sprays into both nostrils daily., Disp: 16 g, Rfl: 2   HYDROcodone-acetaminophen (NORCO) 7.5-325 MG tablet, Take 1 tablet by mouth every 6 (six) hours as needed for moderate pain., Disp: , Rfl:    levothyroxine (SYNTHROID) 25 MCG tablet, Take 1 tablet (25 mcg total) by mouth daily., Disp: 90 tablet, Rfl: 3    meloxicam (MOBIC) 15 MG tablet, Take 1 tablet (15 mg total) by mouth daily., Disp: 30 tablet, Rfl: 0   Misc Natural Products (BEET ROOT) 500 MG CAPS, Take 500 mg by mouth daily., Disp: , Rfl:    montelukast (SINGULAIR) 10 MG tablet, Take 1 tablet (10 mg total) by mouth at bedtime., Disp: 90 tablet, Rfl: 3   torsemide (DEMADEX) 20 MG tablet, TAKE 1 TABLET BY MOUTH ONCE DAILY. START WITH 1 TABLET AND REDURE TO 1/2 (ONE-HALF) TABLET AS DIRECTED, Disp: , Rfl:  Social History   Socioeconomic History   Marital status: Divorced    Spouse name: Not on file   Number of children: 3   Years of education: 12   Highest education level: High school graduate  Occupational History   Not on file  Tobacco Use   Smoking status: Former    Current packs/day: 0.00    Average packs/day: 0.5 packs/day for 10.0 years (5.0 ttl pk-yrs)    Types: Cigarettes    Start date: 06/03/2010    Quit date: 06/02/2020    Years since quitting: 2.2    Passive exposure: Past   Smokeless tobacco: Never  Vaping Use   Vaping status: Never Used  Substance and Sexual Activity   Alcohol use: Not Currently   Drug use: Not Currently   Sexual activity: Not Currently    Birth control/protection: Surgical  Other Topics Concern   Not on file  Social History Narrative   Right handed   Caffeine use: no tea, Coffee-tries to drink decaf, diet sodas   Mother also sees me, Anne Shutter   Social Determinants of Health   Financial Resource Strain: Patient Declined (03/14/2022)   Received from [facility], [facility]   Overall Financial Resource Strain (CARDIA)    Difficulty of Paying Living Expenses: Patient declined  Food Insecurity: Patient Declined (03/14/2022)   Received from [facility], [facility]   Hunger Vital Sign    Worried About Running Out of Food in the Last Year: Patient declined    Ran Out of Food in the Last Year: Patient declined  Transportation Needs: No Transportation Needs (04/16/2022)   Received from  [employer], [facility]   PRAPARE - Transportation    Lack of Transportation (Medical): No    Lack of Transportation (Non-Medical): No  Physical Activity: Not on file  Stress: No Stress Concern Present (04/16/2022)   Received from [facility], [facility] of Occupational Health - Occupational Stress Questionnaire    Feeling of Stress : Not at all  Social Connections: Unknown (03/01/2022)   Received from [facility], [facility]   Social Network    Social Network: Not on file  Intimate Partner Violence: Not At Risk (04/16/2022)   Received from [facility], [facility]   HITS    Over the last 12 months how often did your partner physically hurt you?: 1    Over the last 12 months how often did your partner insult you or talk down to you?: 1    Over the last 12 months how often did your partner threaten you with physical harm?: 1    Over the last 12 months how often did your partner scream or curse at you?: 1   Family History  Problem Relation Age of Onset   Diabetes Mother    Liver disease Mother    COPD Father    Diabetes Father    Heart disease Maternal Grandmother    Cancer Maternal Grandfather    Cancer Paternal Grandmother    Diabetes Paternal Grandfather     Objective: Office vital signs reviewed. BP 118/73   Pulse 70   Temp 98.6 F (37 C)   Ht 5\' 3"  (1.6 m)   Wt 232 lb (105.2 kg)   SpO2 98%   BMI 41.10 kg/m   Physical Examination:  General: Awake, alert, obese, No acute distress HEENT: Sclera white.  Moist mucous membranes.  No exophthalmos.  No goiter Cardio: regular rate and rhythm, S1S2 heard, no murmurs appreciated Extremities: Nonpitting edema present in the lower extremities MSK:  Cervical spine: No midline tenderness palpation.  She has tenderness palpation along the left of the trapezius.  She has positive Spurling's to the right radiating down the left upper extremity.  Light touch and station grossly intact.  Range of  motion of bilateral shoulders symmetric and appears painless.  Assessment/ Plan: 47 y.o. female   Subclinical hypothyroidism - Plan: TSH, T4, Free  History of sleeve gastrectomy - Plan: Vitamin B12, Iron, TIBC and Ferritin Panel, CBC, CMP14+EGFR  Cervical radiculopathy - Plan: MR Cervical Spine Wo Contrast  Check levels, B12, iron, CMP given sleeve gastrectomy.  May need to adjust thyroid meds.  Still having some peripheral edema but I suspect this is due to fluctuations in weight etc.  She continues to have progressive cervical radiculopathy with numbness  and tingling along the C6 dermatome.  Suspect compression of nerve in this area.  Given weakness now that is present I am going to order MRI of the cervical spine as she has completed physical therapy.   Raliegh Ip, DO [facility] [phone]

## 2022-09-06 LAB — CMP14+EGFR
ALT: 13 IU/L (ref 0–32)
AST: 16 IU/L (ref 0–40)
Albumin: 3.7 g/dL — ABNORMAL LOW (ref 3.9–4.9)
Alkaline Phosphatase: 85 IU/L (ref 44–121)
BUN/Creatinine Ratio: 33 — ABNORMAL HIGH (ref 9–23)
BUN: 21 mg/dL (ref 6–24)
Bilirubin Total: 0.4 mg/dL (ref 0.0–1.2)
CO2: 24 mmol/L (ref 20–29)
Calcium: 9.3 mg/dL (ref 8.7–10.2)
Chloride: 105 mmol/L (ref 96–106)
Creatinine, Ser: 0.63 mg/dL (ref 0.57–1.00)
Globulin, Total: 2.5 g/dL (ref 1.5–4.5)
Glucose: 87 mg/dL (ref 70–99)
Potassium: 4 mmol/L (ref 3.5–5.2)
Sodium: 143 mmol/L (ref 134–144)
Total Protein: 6.2 g/dL (ref 6.0–8.5)
eGFR: 111 mL/min/{1.73_m2} (ref >59)

## 2022-09-06 LAB — TSH: TSH: 1.05 u[IU]/mL (ref 0.450–4.500)

## 2022-09-06 LAB — CBC
Hematocrit: 40.2 % (ref 34.0–46.6)
Hemoglobin: 13.2 g/dL (ref 11.1–15.9)
MCH: 29.1 pg (ref 26.6–33.0)
MCHC: 32.8 g/dL (ref 31.5–35.7)
MCV: 89 fL (ref 79–97)
Platelets: 209 10*3/uL (ref 150–450)
RBC: 4.53 x10E6/uL (ref 3.77–5.28)
RDW: 15.5 % — ABNORMAL HIGH (ref 11.7–15.4)
WBC: 5.1 10*3/uL (ref 3.4–10.8)

## 2022-09-06 LAB — IRON,TIBC AND FERRITIN PANEL
Ferritin: 45 ng/mL (ref 15–150)
Iron Saturation: 25 % (ref 15–55)
Iron: 58 ug/dL (ref 27–159)
Total Iron Binding Capacity: 235 ug/dL — ABNORMAL LOW (ref 250–450)
UIBC: 177 ug/dL (ref 131–425)

## 2022-09-06 LAB — VITAMIN B12: Vitamin B-12: 520 pg/mL (ref 232–1245)

## 2022-09-06 LAB — T4, FREE: Free T4: 0.97 ng/dL (ref 0.82–1.77)

## 2022-09-18 ENCOUNTER — Ambulatory Visit (HOSPITAL_COMMUNITY)
Admission: RE | Admit: 2022-09-18 | Discharge: 2022-09-18 | Disposition: A | Discharge status: Home / Self Care | Payer: Medicaid Other | Source: Ambulatory Visit | Attending: Family Medicine | Admitting: Family Medicine

## 2022-09-18 DIAGNOSIS — M5412 Radiculopathy, cervical region: Secondary | ICD-10-CM | POA: Diagnosis present

## 2022-09-27 ENCOUNTER — Ambulatory Visit: Payer: Medicaid Other

## 2022-09-27 ENCOUNTER — Ambulatory Visit
Admission: RE | Admit: 2022-09-27 | Discharge: 2022-09-27 | Disposition: A | Discharge status: Home / Self Care | Payer: Medicaid Other | Source: Ambulatory Visit | Attending: Family Medicine | Admitting: Family Medicine

## 2022-09-27 ENCOUNTER — Other Ambulatory Visit: Payer: Self-pay | Admitting: Family Medicine

## 2022-09-27 ENCOUNTER — Encounter: Payer: Self-pay | Admitting: Family Medicine

## 2022-09-27 VITALS — BP 117/75 | HR 74 | Temp 97.2°F | Ht 63.0 in | Wt 223.0 lb

## 2022-09-27 DIAGNOSIS — L989 Disorder of the skin and subcutaneous tissue, unspecified: Secondary | ICD-10-CM | POA: Diagnosis not present

## 2022-09-27 DIAGNOSIS — L819 Disorder of pigmentation, unspecified: Secondary | ICD-10-CM | POA: Diagnosis not present

## 2022-09-27 DIAGNOSIS — Z1231 Encounter for screening mammogram for malignant neoplasm of breast: Secondary | ICD-10-CM

## 2022-09-27 MED ORDER — TRETINOIN 0.025 % EX CREA
TOPICAL_CREAM | Freq: Every day | CUTANEOUS | 0 refills | Status: AC
Start: 2022-09-27 — End: ?

## 2022-09-27 MED ORDER — TRIAMCINOLONE ACETONIDE 0.1 % EX CREA: 1.0000 | TOPICAL_CREAM | Freq: Two times a day (BID) | CUTANEOUS | 0 refills | Status: DC

## 2022-09-27 NOTE — Progress Notes (Signed)
Subjective:  Patient ID: Selena Howard, female    DOB: 1975/05/19, 47 y.o.   MRN: [id number]  Patient Care Team: Raliegh Ip, DO as PCP - General (Family Medicine) Raliegh Ip, DO as Consulting Physician (Family Medicine) Pollyann Savoy, MD as Consulting Physician (Rheumatology) Levert Feinstein, MD as Consulting Physician (Neurology) Eldred Manges, MD as Consulting Physician (Orthopedic Surgery) Wendall Stade, MD as Consulting Physician (Cardiology)   Chief Complaint:  spot on right middle finger (X 2 1/2 weeks )   HPI: Selena Howard is a 47 y.o. female presenting on 09/27/2022 for spot on right middle finger (X 2 1/2 weeks )   Pt presents today with complaints of lesions to her distal middle finger. States they started 2.5 weeks ago. She has also had similar lesions on her feet at times. The lesions on her foot will itch but then go away.  She also reports discoloration of her face, brown spots. She has tried over the counter topicals without resolution of the discoloration. She does not wear sunscreen daily.         Relevant past medical, surgical, family, and social history reviewed and updated as indicated.  Allergies and medications reviewed and updated. Data reviewed: Chart in Epic.   Past Medical History:  Diagnosis Date   Anxiety    Arthritis    Endometriosis    now s/p partial hysterectomy 06/2020   GERD (gastroesophageal reflux disease)    Osteoarthritis    Spinal stenosis at L4-L5 level     Past Surgical History:  Procedure Laterality Date   ABDOMINAL HYSTERECTOMY     partial, ovary sparing   CESAREAN SECTION     COLONOSCOPY WITH PROPOFOL N/A 08/17/2021   Procedure: COLONOSCOPY WITH PROPOFOL;  Surgeon: Dolores Frame, MD;  [facility];  Service: Gastroenterology;  Laterality: N/A;  915   HEMOSTASIS CLIP PLACEMENT  08/17/2021   Procedure: HEMOSTASIS CLIP PLACEMENT;  Surgeon: Dolores Frame, MD;  Location:  [facility];  Service: Gastroenterology;;   POLYPECTOMY  08/17/2021   Procedure: POLYPECTOMY;  Surgeon: Marguerita Merles, Reuel Boom, MD;  Location: [facility];  Service: Gastroenterology;;   SUBMUCOSAL LIFTING INJECTION  08/17/2021   Procedure: SUBMUCOSAL LIFTING INJECTION;  Surgeon: Marguerita Merles, Reuel Boom, MD;  [facility]: [facility];  Service: Gastroenterology;;   SUBMUCOSAL TATTOO INJECTION  08/17/2021   Procedure: SUBMUCOSAL TATTOO INJECTION;  Surgeon: Marguerita Merles, Reuel Boom, MD;  Location: [facility];  Service: Gastroenterology;;   WISDOM TOOTH EXTRACTION Bilateral     Social History   Socioeconomic History   Marital status: Divorced    Spouse name: Not on file   Number of children: 3   Years of education: 12   Highest education level: High school graduate  Occupational History   Not on file  Tobacco Use   Smoking status: Former    Current packs/day: 0.00    Average packs/day: 0.5 packs/day for 10.0 years (5.0 ttl pk-yrs)    Types: Cigarettes    Start date: 06/03/2010    Quit date: 06/02/2020    Years since quitting: 2.3    Passive exposure: Past   Smokeless tobacco: Never  Vaping Use   Vaping status: Never Used  Substance and Sexual Activity   Alcohol use: Not Currently   Drug use: Not Currently   Sexual activity: Not Currently    Birth control/protection: Surgical  Other Topics Concern   Not on file  Social History Narrative   Right  handed   Caffeine use: no tea, Coffee-tries to drink decaf, diet sodas   Mother also sees me, Selena Howard   Social Determinants of Health   Financial Resource Strain: Patient Declined (03/14/2022)   Received from [facility], [facility]   Overall Financial Resource Strain (CARDIA)    Difficulty of Paying Living Expenses: Patient declined  Food Insecurity: Patient Declined (03/14/2022)   Received from [facility], [facility]   Hunger Vital Sign    Worried About Running Out of Food in the Last Year: Patient  declined    Ran Out of Food in the Last Year: Patient declined  Transportation Needs: No Transportation Needs (04/16/2022)   Received from [employer], [facility]   PRAPARE - Transportation    Lack of Transportation (Medical): No    Lack of Transportation (Non-Medical): No  Physical Activity: Not on file  Stress: No Stress Concern Present (04/16/2022)   Received from [facility], [facility] of Occupational Health - Occupational Stress Questionnaire    Feeling of Stress : Not at all  Social Connections: Unknown (03/01/2022)   Received from [facility], [facility]   Social Network    Social Network: Not on file  Intimate Partner Violence: Not At Risk (04/16/2022)   Received from [facility], [facility]   HITS    Over the last 12 months how often did your partner physically hurt you?: 1    Over the last 12 months how often did your partner insult you or talk down to you?: 1    Over the last 12 months how often did your partner threaten you with physical harm?: 1    Over the last 12 months how often did your partner scream or curse at you?: 1    Outpatient Encounter Medications as of 09/27/2022  Medication Sig   betamethasone valerate (VALISONE) 0.1 % cream Apply 1 Application topically daily as needed (foor rash).   Biotin 1000 MCG CHEW Chew 2 Pieces of gum by mouth daily.   Cholecalciferol (VITAMIN D3) 50 MCG (2000 UT) CHEW Chew 2,000 Pieces of gum by mouth daily.   DULoxetine (CYMBALTA) 60 MG capsule Take 1 capsule by mouth once daily   esomeprazole (NEXIUM) 40 MG capsule Take 1 capsule (40 mg total) by mouth daily.   fluticasone (FLONASE) 50 MCG/ACT nasal spray Place 2 sprays into both nostrils daily.   HYDROcodone-acetaminophen (NORCO) 7.5-325 MG tablet Take 1 tablet by mouth every 6 (six) hours as needed for moderate pain.   levothyroxine (SYNTHROID) 25 MCG tablet Take 1 tablet (25 mcg total) by mouth daily.   meloxicam (MOBIC) 15 MG tablet Take  1 tablet (15 mg total) by mouth daily.   Misc Natural Products (BEET ROOT) 500 MG CAPS Take 500 mg by mouth daily.   montelukast (SINGULAIR) 10 MG tablet Take 1 tablet (10 mg total) by mouth at bedtime.   torsemide (DEMADEX) 20 MG tablet TAKE 1 TABLET BY MOUTH ONCE DAILY. START WITH 1 TABLET AND REDURE TO 1/2 (ONE-HALF) TABLET AS DIRECTED   tretinoin (RETIN-A) 0.025 % cream Apply topically at bedtime.   triamcinolone cream (KENALOG) 0.1 % Apply 1 Application topically 2 (two) times daily.   No facility-administered encounter medications on file as of 09/27/2022.    Allergies  Allergen Reactions   Keflex [Cephalexin] Nausea And Vomiting    Review of Systems  Constitutional:  Negative for activity change, appetite change, chills, diaphoresis, fatigue, fever and unexpected weight change.  HENT: Negative.    Eyes: Negative.  Negative for photophobia and visual disturbance.  Respiratory:  Negative for cough, chest tightness and shortness of breath.   Cardiovascular:  Negative for chest pain, palpitations and leg swelling.  Gastrointestinal:  Negative for abdominal pain, blood in stool, constipation, diarrhea, nausea and vomiting.  Endocrine: Negative.   Genitourinary:  Negative for decreased urine volume, difficulty urinating, dysuria, frequency and urgency.  Musculoskeletal:  Negative for arthralgias and myalgias.  Skin:  Positive for color change and rash. Negative for pallor and wound.  Allergic/Immunologic: Negative.   Neurological:  Negative for dizziness, tremors, seizures, syncope, facial asymmetry, speech difficulty, weakness, light-headedness, numbness and headaches.  Hematological: Negative.   Psychiatric/Behavioral:  Negative for confusion, hallucinations, sleep disturbance and suicidal ideas.   All other systems reviewed and are negative.       Objective:  BP 117/75   Pulse 74   Temp (!) 97.2 F (36.2 C) (Temporal)   Ht 5\' 3"  (1.6 m)   Wt 223 lb (101.2 kg)   SpO2 96%    BMI 39.50 kg/m    Wt Readings from Last 3 Encounters:  09/27/22 223 lb (101.2 kg)  09/05/22 232 lb (105.2 kg)  06/28/22 272 lb 12.8 oz (123.7 kg)    Physical Exam Vitals and nursing note reviewed.  Constitutional:      General: She is not in acute distress.    Appearance: Normal appearance. She is obese. She is not ill-appearing, toxic-appearing or diaphoretic.  HENT:     Head: Normocephalic and atraumatic.     Mouth/Throat:     Mouth: Mucous membranes are moist.  Eyes:     Pupils: Pupils are equal, round, and reactive to light.  Cardiovascular:     Rate and Rhythm: Normal rate and regular rhythm.     Heart sounds: Normal heart sounds.  Pulmonary:     Effort: Pulmonary effort is normal.     Breath sounds: Normal breath sounds.  Skin:    General: Skin is warm and dry.     Capillary Refill: Capillary refill takes less than 2 seconds.     Findings: Lesion (small lestion to distal right middle finger, image below) present.     Comments: Brown spots to forehead and lateral cheeks.   Neurological:     General: No focal deficit present.     Mental Status: She is alert and oriented to person, place, and time.  Psychiatric:        Mood and Affect: Mood normal.        Behavior: Behavior normal.        Thought Content: Thought content normal.        Judgment: Judgment normal.     Results for orders placed or performed in visit on 09/05/22  Vitamin B12  Result Value Ref Range   Vitamin B-12 520 232 - 1,245 pg/mL  Iron, TIBC and Ferritin Panel  Result Value Ref Range   Total Iron Binding Capacity 235 (L) 250 - 450 ug/dL   UIBC 595 638 - 756 ug/dL   Iron 58 27 - 433 ug/dL   Iron Saturation 25 15 - 55 %   Ferritin 45 15 - 150 ng/mL  CBC  Result Value Ref Range   WBC 5.1 3.4 - 10.8 x10E3/uL   RBC 4.53 3.77 - 5.28 x10E6/uL   Hemoglobin 13.2 11.1 - 15.9 g/dL   Hematocrit 29.5 18.8 - 46.6 %   MCV 89 79 - 97 fL  MCH 29.1 26.6 - 33.0 pg   MCHC 32.8 31.5 - 35.7 g/dL   RDW  40.9 (H) 81.1 - 15.4 %   Platelets 209 150 - 450 x10E3/uL  CMP14+EGFR  Result Value Ref Range   Glucose 87 70 - 99 mg/dL   BUN 21 6 - 24 mg/dL   Creatinine, Ser 9.14 0.57 - 1.00 mg/dL   eGFR 782 >95 AO/ZHY/8.65   BUN/Creatinine Ratio 33 (H) 9 - 23   Sodium 143 134 - 144 mmol/L   Potassium 4.0 3.5 - 5.2 mmol/L   Chloride 105 96 - 106 mmol/L   CO2 24 20 - 29 mmol/L   Calcium 9.3 8.7 - 10.2 mg/dL   Total Protein 6.2 6.0 - 8.5 g/dL   Albumin 3.7 (L) 3.9 - 4.9 g/dL   Globulin, Total 2.5 1.5 - 4.5 g/dL   Bilirubin Total 0.4 0.0 - 1.2 mg/dL   Alkaline Phosphatase 85 44 - 121 IU/L   AST 16 0 - 40 IU/L   ALT 13 0 - 32 IU/L  TSH  Result Value Ref Range   TSH 1.050 0.450 - 4.500 uIU/mL  T4, Free  Result Value Ref Range   Free T4 0.97 0.82 - 1.77 ng/dL       Pertinent labs & imaging results that were available during my care of the patient were reviewed by me and considered in my medical decision making.  Assessment & Plan:  Cloye was seen today for spot on right middle finger.  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 Will trial below to see if beneficial. Other differential includes herpetic whitlow, etc. Pt aware to report persistent symptoms despite treatment.  -     triamcinolone cream (KENALOG) 0.1 %; Apply 1 Application topically 2 (two) times daily.  Discoloration of skin of face Discussed use of below in detail. She is aware to use 2-3 times weekly and build up tolerance. Aware of importance of sunscreen use.  -     tretinoin (RETIN-A) 0.025 % cream; Apply topically at bedtime.     Continue all other maintenance medications.  Follow up plan: Return if symptoms worsen or fail to improve.   Continue healthy lifestyle choices, including diet (rich in fruits, vegetables, and lean proteins, and low in salt and simple carbohydrates) and exercise (at least 30 minutes of moderate physical  activity daily).   The above assessment and management plan was discussed with the patient. The patient verbalized understanding of and has agreed to the management plan. Patient is aware to call the clinic if they develop any new symptoms or if symptoms persist or worsen. Patient is aware when to return to the clinic for a follow-up visit. Patient educated on when it is appropriate to go to the emergency department.   Kari Baars, FNP-C [facility] [phone]

## 2022-10-05 ENCOUNTER — Other Ambulatory Visit (HOSPITAL_COMMUNITY): Payer: Self-pay

## 2022-10-09 ENCOUNTER — Other Ambulatory Visit: Payer: Self-pay | Admitting: Family Medicine

## 2022-10-09 DIAGNOSIS — M5412 Radiculopathy, cervical region: Secondary | ICD-10-CM

## 2022-10-09 DIAGNOSIS — M47812 Spondylosis without myelopathy or radiculopathy, cervical region: Secondary | ICD-10-CM

## 2022-10-09 NOTE — Progress Notes (Signed)
Orders Placed This Encounter  Procedures   Ambulatory referral to Neurosurgery    Referral Priority:   Routine    Referral Type:   Surgical    Referral Reason:   Specialty Services Required    Requested Specialty:   Neurosurgery    Number of Visits Requested:   1    

## 2022-11-14 ENCOUNTER — Encounter: Payer: Self-pay | Admitting: Family

## 2022-11-14 ENCOUNTER — Ambulatory Visit: Payer: Medicaid Other | Admitting: Family

## 2022-11-14 VITALS — BP 108/75 | HR 90 | Temp 97.9°F | Ht 63.0 in | Wt 208.8 lb

## 2022-11-14 DIAGNOSIS — M542 Cervicalgia: Secondary | ICD-10-CM

## 2022-11-14 DIAGNOSIS — J3489 Other specified disorders of nose and nasal sinuses: Secondary | ICD-10-CM

## 2022-11-14 MED ORDER — FLUTICASONE PROPIONATE 50 MCG/ACT NA SUSP: 2.0000 | Freq: Every day | NASAL | 2 refills | Status: AC

## 2022-11-14 MED ORDER — BACLOFEN 10 MG PO TABS
10.0000 mg | ORAL_TABLET | Freq: Three times a day (TID) | ORAL | 0 refills | Status: DC
Start: 2022-11-14 — End: 2023-02-06

## 2022-11-14 MED ORDER — PREDNISONE 20 MG PO TABS
40.0000 mg | ORAL_TABLET | Freq: Every day | ORAL | 0 refills | Status: AC
Start: 2022-11-14 — End: 2022-11-19

## 2022-11-14 NOTE — Patient Instructions (Signed)
 Cervical Strain and Sprain Rehab Ask your health care provider which exercises are safe for you. Do exercises exactly as told by your health care provider and adjust them as directed. It is normal to feel mild stretching, pulling, tightness, or discomfort as you do these exercises. Stop right away if you feel sudden pain or your pain gets worse. Do not begin these exercises until told by your health care provider. Stretching and range-of-motion exercises Cervical side bending  Using good posture, sit on a stable chair or stand up. Without moving your shoulders, slowly tilt your left / right ear to your shoulder until you feel a stretch in the neck muscles on the opposite side. You should be looking straight ahead. Hold for __________ seconds. Repeat with the other side of your neck. Repeat __________ times. Complete this exercise __________ times a day. Cervical rotation  Using good posture, sit on a stable chair or stand up. Slowly turn your head to the side as if you are looking over your left / right shoulder. Keep your eyes level with the ground. Stop when you feel a stretch along the side and the back of your neck. Hold for __________ seconds. Repeat this by turning to your other side. Repeat __________ times. Complete this exercise __________ times a day. Thoracic extension and pectoral stretch  Roll a towel or a small blanket so it is about 4 inches (10 cm) in diameter. Lie down on your back on a firm surface. Put the towel in the middle of your back across your spine. It should not be under your shoulder blades. Put your hands behind your head and let your elbows fall out to your sides. Hold for __________ seconds. Repeat __________ times. Complete this exercise __________ times a day. Strengthening exercises Upper cervical flexion  Lie on your back with a thin pillow behind your head or a small, rolled-up towel under your neck. Gently tuck your chin toward your chest and nod  your head down to look toward your feet. Do not lift your head off the pillow. Hold for __________ seconds. Release the tension slowly. Relax your neck muscles completely before you repeat this exercise. Repeat __________ times. Complete this exercise __________ times a day. Cervical extension  Stand about 6 inches (15 cm) away from a wall, with your back facing the wall. Place a soft object, about 6-8 inches (15-20 cm) in diameter, between the back of your head and the wall. A soft object could be a small pillow, a ball, or a folded towel. Gently tilt your head back and press into the soft object. Keep your jaw and forehead relaxed. Hold for __________ seconds. Release the tension slowly. Relax your neck muscles completely before you repeat this exercise. Repeat __________ times. Complete this exercise __________ times a day. Posture and body mechanics Body mechanics refer to the movements and positions of your body while you do your daily activities. Posture is part of body mechanics. Good posture and healthy body mechanics can help to relieve stress in your body's tissues and joints. Good posture means that your spine is in its natural S-curve position (your spine is neutral), your shoulders are pulled back slightly, and your head is not tipped forward. The following are general guidelines for using improved posture and body mechanics in your everyday activities. Sitting  When sitting, keep your spine neutral and keep your feet flat on the floor. Use a footrest, if needed, and keep your thighs parallel to the floor. Avoid rounding  your shoulders. Avoid tilting your head forward. When working at a desk or a computer, keep your desk at a height where your hands are slightly lower than your elbows. Slide your chair under your desk so you are close enough to maintain good posture. When working at a computer, place your monitor at a height where you are looking straight ahead and you do not have to  tilt your head forward or downward to look at the screen. Standing  When standing, keep your spine neutral and keep your feet about hip-width apart. Keep a slight bend in your knees. Your ears, shoulders, and hips should line up. When you do a task in which you stand in one place for a long time, place one foot up on a stable object that is 2-4 inches (5-10 cm) high, such as a footstool. This helps keep your spine neutral. Resting When lying down and resting, avoid positions that are most painful for you. Try to support your neck in a neutral position. You can use a contour pillow or a small rolled-up towel. Your pillow should support your neck but not push on it. This information is not intended to replace advice given to you by your health care provider. Make sure you discuss any questions you have with your health care provider. Document Revised: 04/24/2022 Document Reviewed: 07/11/2021 Elsevier Patient Education  2024 [employer].

## 2022-11-14 NOTE — Progress Notes (Signed)
Subjective:    Patient ID: Selena Howard, female    DOB: 05/04/1975, 47 y.o.   MRN: [id number]  Chief Complaint  Patient presents with   Neck Pain    2 weeks bad headache makes her nauseas    Pt presents to the office today with neck pain for the last two weeks. She had MR Cervical spine on 09/18/22 that showed, "1. Cervical spine spondylosis as described above. 2. No acute osseous injury of the cervical spine."  She is followed by [facility].   Complaining of sinus pressure.  Neck Pain  This is a new problem. The current episode started 1 to 4 weeks ago. The problem occurs intermittently. The problem has been gradually worsening. The pain is associated with nothing. The pain is present in the left side, right side and midline. The quality of the pain is described as aching. The pain is at a severity of 8/10. The symptoms are aggravated by bending. Associated symptoms include headaches, numbness (left arm) and photophobia. Pertinent negatives include no fever, pain with swallowing, trouble swallowing, visual change or weakness. Associated symptoms comments: Nausea . She has tried bed rest (tylenol) for the symptoms. The treatment provided mild relief.      Review of Systems  Constitutional:  Negative for fever.  HENT:  Negative for trouble swallowing.   Eyes:  Positive for photophobia.  Musculoskeletal:  Positive for neck pain.  Neurological:  Positive for numbness (left arm) and headaches. Negative for weakness.  All other systems reviewed and are negative.      Objective:   Physical Exam Vitals reviewed.  Constitutional:      General: She is not in acute distress.    Appearance: She is well-developed.  HENT:     Head: Normocephalic and atraumatic.     Nose:     Right Sinus: Maxillary sinus tenderness present.     Left Sinus: Maxillary sinus tenderness present.  Eyes:     Pupils: Pupils are equal, round, and reactive to light.  Neck:     Thyroid: No thyromegaly.   Cardiovascular:     Rate and Rhythm: Normal rate and regular rhythm.     Heart sounds: Normal heart sounds. No murmur heard. Pulmonary:     Effort: Pulmonary effort is normal. No respiratory distress.     Breath sounds: Normal breath sounds. No wheezing.  Abdominal:     General: Bowel sounds are normal. There is no distension.     Palpations: Abdomen is soft.     Tenderness: There is no abdominal tenderness.  Musculoskeletal:        General: No tenderness. Normal range of motion.     Cervical back: Normal range of motion and neck supple.     Comments: Full ROM of neck, pain with flexion  Skin:    General: Skin is warm and dry.  Neurological:     Mental Status: She is alert and oriented to person, place, and time.     Cranial Nerves: No cranial nerve deficit.     Deep Tendon Reflexes: Reflexes are normal and symmetric.  Psychiatric:        Behavior: Behavior normal.        Thought Content: Thought content normal.        Judgment: Judgment normal.       BP 108/75   Pulse 90   Temp 97.9 F (36.6 C) (Temporal)   Ht 5\' 3"  (1.6 m)   Wt 208 lb 12.8 oz (94.7  kg)   SpO2 96%   BMI 36.99 kg/m      Assessment & Plan:  Maki Benney comes in today with chief complaint of Neck Pain (2 weeks bad headache makes her nauseas )   Diagnosis and orders addressed:  1. Neck pain Start baclofen 10 mg as needed  Continue tylenol  Start prednisone  ROM exercises given  Keep follow up with neurosurgeon  Follow up if symptoms worsen or do not improve - baclofen (LIORESAL) 10 MG tablet; Take 1 tablet (10 mg total) by mouth 3 (three) times daily.  Dispense: 30 each; Refill: 0 - predniSONE (DELTASONE) 20 MG tablet; Take 2 tablets (40 mg total) by mouth daily with breakfast for 5 days.  Dispense: 10 tablet; Refill: 0  2. Sinus pressure No infection present  Continue Singulair and restart flonase Prednisone should help with inflammation  - predniSONE (DELTASONE) 20 MG tablet; Take 2  tablets (40 mg total) by mouth daily with breakfast for 5 days.  Dispense: 10 tablet; Refill: 0 - fluticasone (FLONASE) 50 MCG/ACT nasal spray; Place 2 sprays into both nostrils daily.  Dispense: 16 g; Refill: 2   Jannifer Rodney, FNP

## 2023-01-02 NOTE — Progress Notes (Signed)
 Office Visit Note  Patient: Selena Howard             Date of Birth: 01-May-1975           MRN: [id number]             PCP: Selena Norene HERO, DO Referring: Selena Norene HERO, DO Visit Date: 01/16/2023 Occupation: @GUAROCC @  Subjective:  Pain in multiple joints   History of Present Illness: Selena Howard is a 47 y.o. female with history of osteoarthritis and DDD.  Patient was last seen in the office on 01/13/2022.  Patient's been experiencing increased total body pain due to underlying fibromyalgia.  Her fibromyalgia has been significantly interfering with her quality of life due to increased myofascial pain and fatigue on a daily basis.  She presents today with increased trapezius muscle tension and tenderness bilaterally.  She has been experiencing more frequent tension headaches as well as intermittent muscle spasms.  She remains on Cymbalta  as prescribed but has not followed up with pain management since summer 2024.  Patient reports that she underwent gastric sleeve surgery in July 2024 and is unable to take NSAIDs.  She has tried taking Lyrica  and gabapentin  in the past but discontinued due to intolerance.  Patient continues to have chronic pain in her neck, lower back, both hands, and both knee joints.  She has noticed increased crepitus in both knees.  Patient had an adequate response to cortisone injections in her knee joints in the past.  Patient states that she has had about 90 pounds of intentional weight loss since surgical intervention but continues to have persistent discomfort and popping in her knees.     Activities of Daily Living:  Patient reports morning stiffness for 5-30 minutes.   Patient Reports nocturnal pain.  Difficulty dressing/grooming: Denies Difficulty climbing stairs: Denies Difficulty getting out of chair: Denies Difficulty using hands for taps, buttons, cutlery, and/or writing: Reports  Review of Systems  Constitutional:  Positive for fatigue.   HENT:  Positive for mouth dryness. Negative for mouth sores.   Eyes:  Positive for dryness.  Respiratory:  Negative for shortness of breath.   Cardiovascular:  Positive for palpitations. Negative for chest pain.  Gastrointestinal:  Positive for constipation. Negative for blood in stool and diarrhea.  Endocrine: Negative for increased urination.  Genitourinary:  Negative for involuntary urination.  Musculoskeletal:  Positive for joint pain, joint pain, joint swelling, myalgias, muscle weakness, morning stiffness, muscle tenderness and myalgias. Negative for gait problem.  Skin:  Positive for hair loss. Negative for color change, rash and sensitivity to sunlight.  Allergic/Immunologic: Negative for susceptible to infections.  Neurological:  Positive for headaches. Negative for dizziness.  Hematological:  Negative for swollen glands.  Psychiatric/Behavioral:  Positive for sleep disturbance. Negative for depressed mood. The patient is nervous/anxious.     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   Vivid dream 03/31/2021   Snoring 03/31/2021   Chronic fatigue 03/31/2021   Excessive daytime sleepiness 03/31/2021   GERD with apnea 03/31/2021   Sleep related headaches 03/31/2021   Body aches 03/30/2021   Paresthesia 02/04/2021  Obstructive sleep apnea 02/04/2021   Vitamin D  deficiency 01/28/2021   History of anxiety 01/28/2021   Bilateral hand numbness 12/16/2020   Other intervertebral disc degeneration, lumbar region 12/16/2020    Past Medical History:  Diagnosis Date   Anxiety    Arthritis     Endometriosis    now s/p partial hysterectomy 06/2020   GERD (gastroesophageal reflux disease)    Osteoarthritis    Spinal stenosis at L4-L5 level     Family History  Problem Relation Age of Onset   Diabetes Mother    Liver disease Mother    COPD Father    Diabetes Father    Heart disease Maternal Grandmother    Cancer Maternal Grandfather    Cancer Paternal Grandmother    Diabetes Paternal Grandfather    Breast cancer Neg Hx    Past Surgical History:  Procedure Laterality Date   ABDOMINAL HYSTERECTOMY     partial, ovary sparing   CESAREAN SECTION     COLONOSCOPY WITH PROPOFOL  N/A 08/17/2021   Procedure: COLONOSCOPY WITH PROPOFOL ;  Surgeon: Eartha Angelia Sieving, MD;  Location: [facility];  Service: Gastroenterology;  Laterality: N/A;  915   HEMOSTASIS CLIP PLACEMENT  08/17/2021   Procedure: HEMOSTASIS CLIP PLACEMENT;  Surgeon: Eartha Angelia Sieving, MD;  Location: [facility];  Service: Gastroenterology;;   POLYPECTOMY  08/17/2021   Procedure: POLYPECTOMY;  Surgeon: Eartha Angelia Sieving, MD;  Location: [facility];  Service: Gastroenterology;;   STOMACH SURGERY  07/12/2022   Gastric Sleeve placed   SUBMUCOSAL LIFTING INJECTION  08/17/2021   Procedure: SUBMUCOSAL LIFTING INJECTION;  Surgeon: Eartha Angelia Sieving, MD;  Location: [facility];  Service: Gastroenterology;;   SUBMUCOSAL TATTOO INJECTION  08/17/2021   Procedure: SUBMUCOSAL TATTOO INJECTION;  Surgeon: Eartha Angelia Sieving, MD;  Location: [facility];  Service: Gastroenterology;;   WISDOM TOOTH EXTRACTION Bilateral    Social History   Social History Narrative   Right handed   Caffeine use: no tea, Coffee-tries to drink decaf, diet sodas   Mother also sees me, Arland Robinette    There is no immunization history on file for this patient.   Objective: Vital Signs: BP 114/81 (BP Location: Right Arm, Patient Position: Sitting, Cuff Size: Large)   Pulse 70   Resp 14   Ht 5' 3  (1.6 m)   Wt 199 lb (90.3 kg)   BMI 35.25 kg/m    Physical Exam Vitals and nursing note reviewed.  Constitutional:      Appearance: She is well-developed.  HENT:     Head: Normocephalic and atraumatic.  Eyes:     Conjunctiva/sclera: Conjunctivae normal.  Cardiovascular:     Rate and Rhythm: Normal rate and regular rhythm.     Heart sounds: Normal heart sounds.  Pulmonary:     Effort: Pulmonary effort is normal.     Breath sounds: Normal breath sounds.  Abdominal:     General: Bowel sounds are normal.     Palpations: Abdomen is soft.  Musculoskeletal:     Cervical back: Normal range of motion.  Lymphadenopathy:     Cervical: No cervical adenopathy.  Skin:    General: Skin is warm and dry.     Capillary Refill: Capillary refill takes less than 2 seconds.  Neurological:     Mental Status: She is alert and oriented to person, place, and time.  Psychiatric:        Behavior: Behavior normal.      Musculoskeletal Exam: Generalized  hyperalgesia and positive tender points.  C-spine has painful range of motion with crepitus.  Trapezius muscle tension and tenderness bilaterally.  Shoulder joints, elbow joints, wrist joints, MCPs, PIPs, DIPs have good range of motion with no synovitis.  Complete fist formation bilaterally.  Hip joints have good range of motion no groin pain.  Tenderness over both trochanteric bursa.  Knee joints have good ROM with no warmth or effusion. Crepitus of both knees.  Ankle joints have good ROM with no tenderness or joint swelling.   CDAI Exam: CDAI Score: -- Patient Global: --; Provider Global: -- Swollen: --; Tender: -- Joint Exam 01/16/2023   No joint exam has been documented for this visit   There is currently no information documented on the homunculus. Go to the Rheumatology activity and complete the homunculus joint exam.  Investigation: No additional findings.  Imaging: No results found.  Recent Labs: Lab Results  Component Value Date    WBC 5.1 09/05/2022   HGB 13.2 09/05/2022   PLT 209 09/05/2022   NA 143 09/05/2022   K 4.0 09/05/2022   CL 105 09/05/2022   CO2 24 09/05/2022   GLUCOSE 87 09/05/2022   BUN 21 09/05/2022   CREATININE 0.63 09/05/2022   BILITOT 0.4 09/05/2022   ALKPHOS 85 09/05/2022   AST 16 09/05/2022   ALT 13 09/05/2022   PROT 6.2 09/05/2022   ALBUMIN 3.7 (L) 09/05/2022   CALCIUM 9.3 09/05/2022    Speciality Comments: Discussed viscosupplement injections at the next visit if she has an adequate response to cortisone injection, exercise, physical therapy and NSAID use.  Procedures:  Trigger Point Inj  Date/Time: 01/16/2023 11:03 AM  Performed by: Cheryl Waddell HERO, PA-C Authorized by: Cheryl Waddell HERO, PA-C   Consent Given by:  Patient Site marked: the procedure site was marked   Timeout: prior to procedure the correct patient, procedure, and site was verified   Indications:  Pain Total # of Trigger Points:  2 Location: neck   Needle Size:  27 G Approach:  Dorsal Medications #1:  0.5 mL lidocaine  1 %; 10 mg triamcinolone  acetonide 40 MG/ML Medications #2:  0.5 mL lidocaine  1 %; 10 mg triamcinolone  acetonide 40 MG/ML Patient tolerance:  Patient tolerated the procedure well with no immediate complications  Allergies: Keflex [cephalexin]      Assessment / Plan:     Visit Diagnoses: Primary osteoarthritis of both hands: X-rays of both hands were consistent with early osteoarthritis on 01/28/2021.  An ultrasound of the hands was performed on 09/29/2021 which was negative for synovitis.  Patient continues to experience intermittent arthralgias and joint stiffness in both hands but has no active inflammation at this time.  Discussed the importance of joint protection and muscle strengthening.  No synovitis was noted on examination today.  She was advised to notify us  if she develops increased joint pain or joint swelling.  Fibromyalgia: Family history of fibromyalgia.  Intolerance to lyrica  and  gabapentin  in the past.  Presents today with generalized hyperalgesia and positive tender points on exam.  She has been experiencing increased total body pain due to underlying fibromyalgia.  She has had increased myofascial pain as well as fatigue on a daily basis.  She is been experiencing more frequent headaches which she attributes to her trapezius muscle tension and tenderness bilaterally.  She remains on Cymbalta  as prescribed.  She is unable to take NSAIDs given history of gastric sleeve.  She has had an intolerance to gabapentin  and Lyrica  in the  past.  She has not followed up with her pain management specialist in summer 2024.  She has been unable to exercise due to the severity of pain and fatigue she has been experiencing.  Her quality of life has been suffering due to the severity of symptoms with fibromyalgia. Different treatment options were discussed today including physical therapy for massage therapy and dry needling as well as trapezius muscle trigger point injections.  Patient requested bilateral trapezius trigger point injections today.  She tolerated procedures well.  Procedure notes were completed above.  Aftercare was discussed.  She will notify us  if her symptoms persist or worsen.  Trapezius muscle spasm: Patient presents today with trapezius muscle tension and tenderness bilaterally.  She has been experiencing muscle spasms as well as more frequent headaches on a daily basis.  Different treatment options were discussed.  Patient requested bilateral trapezius trigger point injections today.  She tolerated the procedures well.  Procedure notes were completed above.  Aftercare was discussed.  Other insomnia: Discussed the importance of good sleep hygiene.   Other fatigue: Chronic, daily.  Discussed the importance of regular exercise and good sleep hygiene.   Bilateral hand numbness - Patient was evaluated by Dr. Onita.  Nerve conduction velocity with EMG results were reviewed from March 30, 2021 which were within normal limits. Not currently symptomatic.   Primary osteoarthritis of both knees - X-rays of both knees were consistent with moderate osteoarthritis and moderate chondromalacia patella on 01/28/2021 She had a left knee joint cortisone injection performed on 04/07/2021.  She had a right knee joint cortisone injection performed on 02/16/2021. She is unable to take NSAIDs given history of undergoing gastric sleeve in July 2024.   She has had about 90 pounds of intentional weight loss but she continues to have discomfort and crepitus in both knees. Inadequate response to cortisone injections in the past. Patient's been experiencing increased pain and crepitus involving both knees.  On examination she has good range of motion of both knee joints with crepitus but no warmth or effusion was noted. Plan to apply for visco-supplementation for both knees. Plan: XR KNEE 3 VIEW LEFT, XR KNEE 3 VIEW RIGHT  Chronic pain of both knees -X-rays of both knees were updated today.  The patient has been experiencing ongoing pain involving both knees as well as crepitus bilaterally.  No warmth or effusion was noted on examination today.  She has had an adequate response to cortisone injections in the past.  Different treatment options were discussed.  Discussed the importance of lower extremity muscle strengthening.  She has been losing weight since having a gastric sleeve performed in July 2024.  Plan to apply for viscosupplementation for both knees since she has had an inadequate response to cortisone injections in the past.  Plan: XR KNEE 3 VIEW LEFT, XR KNEE 3 VIEW RIGHT  This patient is diagnosed with osteoarthritis of the knee(s).    Radiographs show evidence of joint space narrowing, osteophytes, subchondral sclerosis and/or subchondral cysts.  This patient has knee pain which interferes with functional and activities of daily living.    This patient has experienced inadequate response,  adverse effects and/or intolerance with conservative treatments such as acetaminophen , NSAIDS, topical creams, physical therapy or regular exercise, knee bracing and/or weight loss.   This patient has experienced inadequate response or has a contraindication to intra articular steroid injections for at least 3 months.   This patient is not scheduled to have a total knee replacement within  6 months of starting treatment with viscosupplementation.  Primary osteoarthritis of both feet: Ankle joints have good ROM with no tenderness or joint swelling.   Plantar fasciitis, bilateral - Previously under the care of Dr. Janit. Right plantar fascia injection in the past.  Recurrence of pain in left foot.  DDD (degenerative disc disease), cervical: Considering proceeding with surgical intervention in the future.  She has intermittent symptoms of radiculopathy down the left upper extremity.  Patient presents today with trapezius muscle tension and tenderness bilaterally.  He has been experiencing increased muscle tension and spasms.  She has had more frequent headaches on a daily basis which he attributes to the muscle tension.  Different treatment options were discussed today.  Patient requested bilateral trapezius trigger point injections. Procedure notes completed above.   Degeneration of intervertebral disc of lumbar region without discogenic back pain or lower extremity pain - History of degenerative disc disease and spinal stenosis per patient.  Chronic pain.  Under the care of Dr. Gillie.  Patient has tried PT.    Other medical conditions are listed as follows:  Elevated sed rate - August 23, 2021 sed rate was normal at 19.    History of gastroesophageal reflux (GERD)  Vitamin D  deficiency  History of anxiety: Patient mains on Cymbalta  as prescribed.  Onychomycosis  Former smoker  Family history of fibromyalgia  Orders: Orders Placed This Encounter  Procedures   Trigger Point Inj   XR  KNEE 3 VIEW LEFT   XR KNEE 3 VIEW RIGHT   No orders of the defined types were placed in this encounter.    Follow-Up Instructions: Return in about 1 year (around 01/16/2024) for Osteoarthritis, DDD.   Waddell CHRISTELLA Craze, PA-C  Note - This record has been created using Dragon software.  Chart creation errors have been sought, but may not always  have been located. Such creation errors do not reflect on  the standard of medical care.

## 2023-01-15 ENCOUNTER — Ambulatory Visit: Payer: Medicaid Other | Admitting: Physician Assistant

## 2023-01-16 ENCOUNTER — Ambulatory Visit: Payer: Medicaid Other

## 2023-01-16 ENCOUNTER — Telehealth: Payer: Self-pay | Admitting: *Deleted

## 2023-01-16 ENCOUNTER — Ambulatory Visit: Payer: Medicaid Other | Attending: Physician Assistant | Admitting: Physician Assistant

## 2023-01-16 ENCOUNTER — Encounter: Payer: Self-pay | Admitting: Physician Assistant

## 2023-01-16 VITALS — BP 114/81 | HR 70 | Resp 14 | Ht 63.0 in | Wt 199.0 lb

## 2023-01-16 DIAGNOSIS — M25561 Pain in right knee: Secondary | ICD-10-CM

## 2023-01-16 DIAGNOSIS — Z8269 Family history of other diseases of the musculoskeletal system and connective tissue: Secondary | ICD-10-CM

## 2023-01-16 DIAGNOSIS — M62838 Other muscle spasm: Secondary | ICD-10-CM

## 2023-01-16 DIAGNOSIS — M170 Bilateral primary osteoarthritis of knee: Secondary | ICD-10-CM

## 2023-01-16 DIAGNOSIS — G8929 Other chronic pain: Secondary | ICD-10-CM | POA: Diagnosis not present

## 2023-01-16 DIAGNOSIS — M19041 Primary osteoarthritis, right hand: Secondary | ICD-10-CM

## 2023-01-16 DIAGNOSIS — Z8659 Personal history of other mental and behavioral disorders: Secondary | ICD-10-CM

## 2023-01-16 DIAGNOSIS — B351 Tinea unguium: Secondary | ICD-10-CM

## 2023-01-16 DIAGNOSIS — M25562 Pain in left knee: Secondary | ICD-10-CM

## 2023-01-16 DIAGNOSIS — M19042 Primary osteoarthritis, left hand: Secondary | ICD-10-CM

## 2023-01-16 DIAGNOSIS — M797 Fibromyalgia: Secondary | ICD-10-CM

## 2023-01-16 DIAGNOSIS — M5030 Other cervical disc degeneration, unspecified cervical region: Secondary | ICD-10-CM

## 2023-01-16 DIAGNOSIS — E559 Vitamin D deficiency, unspecified: Secondary | ICD-10-CM

## 2023-01-16 DIAGNOSIS — Z87891 Personal history of nicotine dependence: Secondary | ICD-10-CM

## 2023-01-16 DIAGNOSIS — G4709 Other insomnia: Secondary | ICD-10-CM

## 2023-01-16 DIAGNOSIS — Z8719 Personal history of other diseases of the digestive system: Secondary | ICD-10-CM

## 2023-01-16 DIAGNOSIS — R5383 Other fatigue: Secondary | ICD-10-CM

## 2023-01-16 DIAGNOSIS — R200 Anesthesia of skin: Secondary | ICD-10-CM

## 2023-01-16 DIAGNOSIS — R700 Elevated erythrocyte sedimentation rate: Secondary | ICD-10-CM

## 2023-01-16 DIAGNOSIS — M722 Plantar fascial fibromatosis: Secondary | ICD-10-CM

## 2023-01-16 DIAGNOSIS — M19072 Primary osteoarthritis, left ankle and foot: Secondary | ICD-10-CM

## 2023-01-16 DIAGNOSIS — M19071 Primary osteoarthritis, right ankle and foot: Secondary | ICD-10-CM

## 2023-01-16 DIAGNOSIS — M51369 Other intervertebral disc degeneration, lumbar region without mention of lumbar back pain or lower extremity pain: Secondary | ICD-10-CM

## 2023-01-16 MED ORDER — LIDOCAINE HCL 1 % IJ SOLN
0.5000 mL | INTRAMUSCULAR | Status: AC | PRN
  Administered 2023-01-16: .5 mL

## 2023-01-16 MED ORDER — TRIAMCINOLONE ACETONIDE 40 MG/ML IJ SUSP
10.0000 mg | INTRAMUSCULAR | Status: AC | PRN
  Administered 2023-01-16: 10 mg via INTRAMUSCULAR

## 2023-01-16 NOTE — Progress Notes (Signed)
 X-rays of both knees were consistent with mild osteoarthritis and moderate chondromalacia patella.  No radiographic progression was noted when compared to x-rays from 2023.  Please notify patient.  Please apply for viscosupplementation for both knees due to inadequate response to cortisone injections in the past.

## 2023-01-16 NOTE — Telephone Encounter (Signed)
 Patient's insurance [employer] does not cover viscosupplementation

## 2023-01-16 NOTE — Telephone Encounter (Signed)
 Per Sherron Ales, PA-C, Apply for Visco for Bilateral knees. Inadequate response to cortisone in the past.

## 2023-01-17 ENCOUNTER — Other Ambulatory Visit: Payer: Medicaid Other

## 2023-01-17 DIAGNOSIS — Z903 Acquired absence of stomach [part of]: Secondary | ICD-10-CM

## 2023-01-17 LAB — BAYER DCA HB A1C WAIVED: HB A1C (BAYER DCA - WAIVED): 5 % (ref 4.8–5.6)

## 2023-01-18 LAB — CBC WITH DIFFERENTIAL/PLATELET
Basophils Absolute: 0 10*3/uL (ref 0.0–0.2)
Basos: 1 %
EOS (ABSOLUTE): 0 10*3/uL (ref 0.0–0.4)
Eos: 0 %
Hematocrit: 44.5 % (ref 34.0–46.6)
Hemoglobin: 14.3 g/dL (ref 11.1–15.9)
Immature Grans (Abs): 0 10*3/uL (ref 0.0–0.1)
Immature Granulocytes: 0 %
Lymphocytes Absolute: 2.2 10*3/uL (ref 0.7–3.1)
Lymphs: 44 %
MCH: 29.9 pg (ref 26.6–33.0)
MCHC: 32.1 g/dL (ref 31.5–35.7)
MCV: 93 fL (ref 79–97)
Monocytes Absolute: 0.3 10*3/uL (ref 0.1–0.9)
Monocytes: 6 %
Neutrophils Absolute: 2.4 10*3/uL (ref 1.4–7.0)
Neutrophils: 49 %
Platelets: 241 10*3/uL (ref 150–450)
RBC: 4.79 x10E6/uL (ref 3.77–5.28)
RDW: 12.5 % (ref 11.7–15.4)
WBC: 5 10*3/uL (ref 3.4–10.8)

## 2023-01-18 LAB — IRON,TIBC AND FERRITIN PANEL
Ferritin: 64 ng/mL (ref 15–150)
Iron Saturation: 39 % (ref 15–55)
Iron: 115 ug/dL (ref 27–159)
Total Iron Binding Capacity: 296 ug/dL (ref 250–450)
UIBC: 181 ug/dL (ref 131–425)

## 2023-01-18 LAB — CMP14+EGFR
ALT: 11 IU/L (ref 0–32)
AST: 13 [IU]/L (ref 0–40)
Albumin: 4.2 g/dL (ref 3.9–4.9)
Alkaline Phosphatase: 108 IU/L (ref 44–121)
BUN/Creatinine Ratio: 35 — ABNORMAL HIGH (ref 9–23)
BUN: 27 mg/dL — ABNORMAL HIGH (ref 6–24)
Bilirubin Total: 0.5 mg/dL (ref 0.0–1.2)
CO2: 24 mmol/L (ref 20–29)
Calcium: 9.6 mg/dL (ref 8.7–10.2)
Chloride: 104 mmol/L (ref 96–106)
Creatinine, Ser: 0.78 mg/dL (ref 0.57–1.00)
Globulin, Total: 2.4 g/dL (ref 1.5–4.5)
Glucose: 73 mg/dL (ref 70–99)
Potassium: 4.6 mmol/L (ref 3.5–5.2)
Sodium: 141 mmol/L (ref 134–144)
Total Protein: 6.6 g/dL (ref 6.0–8.5)
eGFR: 94 mL/min/{1.73_m2} (ref >59)

## 2023-01-24 ENCOUNTER — Other Ambulatory Visit: Payer: Self-pay | Admitting: Family Medicine

## 2023-01-31 ENCOUNTER — Other Ambulatory Visit: Payer: Self-pay | Admitting: Family Medicine

## 2023-02-05 ENCOUNTER — Ambulatory Visit: Payer: Self-pay | Admitting: Family Medicine

## 2023-02-05 NOTE — Telephone Encounter (Signed)
  Chief Complaint: BLE numbness/tingling Frequency: months Pertinent Negatives: Patient denies new s/s, new HA,   Disposition: [] ED /[] Urgent Care (no appt availability in office) / [x] Appointment(In office/virtual)/ []  [facility]/ [] Home Care/ [] Refused Recommended Disposition /[] [facility]/ []  Follow-up with PCP  Additional Notes: Pt hx of fibromyalgia, states she always has HA, states not new. Denies vision/speech changes. Denies long trips by plane/car.  Denies DM. No recent physicals.   Copied from CRM [id number]. Topic: Clinical - Red Word Triage >> Feb 05, 2023 11:52 AM Geroge Baseman wrote: Red Word that prompted transfer to Nurse Triage: Tingling aching in feet and into legs, she said she can stick herself with her fingernail and cannot feel it. Reason for Disposition  [1] Numbness or tingling in one or both hands AND [2] is a chronic symptom (recurrent or ongoing AND present > 4 weeks)  Answer Assessment - Initial Assessment Questions 1. SYMPTOM: "What is the main symptom you are concerned about?" (e.g., weakness, numbness)     Numbness, tingling BLE 2. ONSET: "When did this start?" (minutes, hours, days; while sleeping)     months 3. LAST NORMAL: "When was the last time you (the patient) were normal (no symptoms)?"     months 4. PATTERN "Does this come and go, or has it been constant since it started?"  "Is it present now?"     intermittent 5. CARDIAC SYMPTOMS: "Have you had any of the following symptoms: chest pain, difficulty breathing, palpitations?"     denies 6. NEUROLOGIC SYMPTOMS: "Have you had any of the following symptoms: headache, dizziness, vision loss, double vision, changes in speech, unsteady on your feet?"     Denies acute, states she has intermittent HA and fibromyalgia 7. OTHER SYMPTOMS: "Do you have any other symptoms?"     denies 8. PREGNANCY: "Is there any chance you are pregnant?" "When was your last menstrual period?"      denies  Protocols used: Neurologic Deficit-A-AH

## 2023-02-06 ENCOUNTER — Ambulatory Visit: Payer: Medicaid Other | Admitting: Family Medicine

## 2023-02-06 ENCOUNTER — Encounter: Payer: Self-pay | Admitting: Family Medicine

## 2023-02-06 VITALS — BP 103/69 | HR 77 | Temp 98.5°F | Ht 63.0 in | Wt 196.0 lb

## 2023-02-06 DIAGNOSIS — Z903 Acquired absence of stomach [part of]: Secondary | ICD-10-CM

## 2023-02-06 DIAGNOSIS — R200 Anesthesia of skin: Secondary | ICD-10-CM | POA: Diagnosis not present

## 2023-02-06 DIAGNOSIS — R202 Paresthesia of skin: Secondary | ICD-10-CM | POA: Diagnosis not present

## 2023-02-06 DIAGNOSIS — E038 Other specified hypothyroidism: Secondary | ICD-10-CM | POA: Diagnosis not present

## 2023-02-06 DIAGNOSIS — M797 Fibromyalgia: Secondary | ICD-10-CM

## 2023-02-06 NOTE — Progress Notes (Signed)
 Subjective:  Patient ID: Selena Howard, female    DOB: Jun 25, 1975, 48 y.o.   MRN: [id number]  Patient Care Team: Jolinda Norene HERO, DO as PCP - General (Family Medicine) Jolinda Norene HERO, DO as Consulting Physician (Family Medicine) Dolphus Reiter, MD as Consulting Physician (Rheumatology) Onita Duos, MD as Consulting Physician (Neurology) Barbarann Oneil BROCKS, MD as Consulting Physician (Orthopedic Surgery) Delford Maude BROCKS, MD as Consulting Physician (Cardiology)   Chief Complaint:  Numbness and tingling (Bilateral lower extremity/Patient states; seems to be getting worse.)   HPI: Selena Howard is a 48 y.o. female presenting on 02/06/2023 for Numbness and tingling (Bilateral lower extremity/Patient states; seems to be getting worse.)  Bilateral LE numbness in toes. States that it is spreading through her foot and up to her ankle especially on left foot. Wonders if it is related to fibromyalgia. She has a history of bariatric surgery so she wonders if it is due to decrease vitamin absorption. She is taking cymbalta  for fibromyalgia. She is not currently taking norco or seeing pain management for her fibromyalgia and back pain. States that she has tried gabapentin  and pregablin in the past believed that it was making her gain weight and have some trouble with word finding. Denies incontinence, denies saddle anesthesia. Declines PT. States that she has tried it in the past and it did not improve her symptoms.   Relevant past medical, surgical, family, and social history reviewed and updated as indicated.  Allergies and medications reviewed and updated. Data reviewed: Chart in Epic.   Past Medical History:  Diagnosis Date   Anxiety    Arthritis    Endometriosis    now s/p partial hysterectomy 06/2020   GERD (gastroesophageal reflux disease)    Osteoarthritis    Spinal stenosis at L4-L5 level     Past Surgical History:  Procedure Laterality Date   ABDOMINAL HYSTERECTOMY      partial, ovary sparing   CESAREAN SECTION     COLONOSCOPY WITH PROPOFOL  N/A 08/17/2021   Procedure: COLONOSCOPY WITH PROPOFOL ;  Surgeon: Eartha Angelia Sieving, MD;  [facility];  Service: Gastroenterology;  Laterality: N/A;  915   HEMOSTASIS CLIP PLACEMENT  08/17/2021   Procedure: HEMOSTASIS CLIP PLACEMENT;  Surgeon: Eartha Angelia Sieving, MD;  Location: [facility];  Service: Gastroenterology;;   POLYPECTOMY  08/17/2021   Procedure: POLYPECTOMY;  Surgeon: Eartha Angelia, Sieving, MD;  [facility];  Service: Gastroenterology;;   STOMACH SURGERY  07/12/2022   Gastric Sleeve placed   SUBMUCOSAL LIFTING INJECTION  08/17/2021   Procedure: SUBMUCOSAL LIFTING INJECTION;  Surgeon: Eartha Angelia Sieving, MD;  [facility];  Service: Gastroenterology;;   SUBMUCOSAL TATTOO INJECTION  08/17/2021   Procedure: SUBMUCOSAL TATTOO INJECTION;  Surgeon: Eartha Angelia Sieving, MD;  [facility];  Service: Gastroenterology;;   WISDOM TOOTH EXTRACTION Bilateral     Social History   Socioeconomic History   Marital status: Divorced    Spouse name: Not on file   Number of children: 3   Years of education: 12   Highest education level: High school graduate  Occupational History   Not on file  Tobacco Use   Smoking status: Former    Current packs/day: 0.00    Average packs/day: 0.5 packs/day for 10.0 years (5.0 ttl pk-yrs)    Types: Cigarettes    Start date: 06/03/2010    Quit date: 06/02/2020    Years since quitting: 2.6    Passive exposure: Past  Smokeless tobacco: Never  Vaping Use   Vaping status: Never Used  Substance and Sexual Activity   Alcohol use: Not Currently   Drug use: Not Currently   Sexual activity: Not Currently    Birth control/protection: Surgical  Other Topics Concern   Not on file  Social History Narrative   Right handed   Caffeine use: no tea, Coffee-tries to drink decaf, diet sodas   Mother also sees me,  Arland Ngo   Social Drivers of Health   Financial Resource Strain: Patient Declined (03/14/2022)   Received from [facility], [facility]   Overall Financial Resource Strain (CARDIA)    Difficulty of Paying Living Expenses: Patient declined  Food Insecurity: Patient Declined (03/14/2022)   Received from [facility], [facility]   Hunger Vital Sign    Worried About Running Out of Food in the Last Year: Patient declined    Ran Out of Food in the Last Year: Patient declined  Transportation Needs: No Transportation Needs (04/16/2022)   Received from [employer], [facility]   PRAPARE - Transportation    Lack of Transportation (Medical): No    Lack of Transportation (Non-Medical): No  Physical Activity: Not on file  Stress: No Stress Concern Present (04/16/2022)   Received from [facility], [facility] - Occupational Stress Questionnaire    Feeling of Stress : Not at all  Social Connections: Unknown (03/01/2022)   Received from [facility], [facility]   Social Network    Social Network: Not on file  Intimate Partner Violence: Not At Risk (04/16/2022)   Received from [facility], [facility]   HITS    Over the last 12 months how often did your partner physically hurt you?: Never    Over the last 12 months how often did your partner insult you or talk down to you?: Never    Over the last 12 months how often did your partner threaten you with physical harm?: Never    Over the last 12 months how often did your partner scream or curse at you?: Never    Outpatient Encounter Medications as of 02/06/2023  Medication Sig   betamethasone  valerate (VALISONE ) 0.1 % cream Apply 1 Application topically daily as needed (foor rash).   Biotin 1000 MCG CHEW Chew 2 Pieces of gum by mouth daily.   calcium carbonate (OS-CAL) 600 MG tablet Take 600 mg by mouth 2 (two) times daily with a meal.   Cholecalciferol (VITAMIN D3) 50 MCG (2000  UT) CHEW Chew 2,000 Pieces of gum by mouth daily.   DULoxetine  (CYMBALTA ) 60 MG capsule Take 1 capsule by mouth once daily   esomeprazole  (NEXIUM ) 40 MG capsule Take 1 capsule (40 mg total) by mouth daily.   FIBER PO Take by mouth.   fluticasone  (FLONASE ) 50 MCG/ACT nasal spray Place 2 sprays into both nostrils daily.   HYDROcodone-acetaminophen  (NORCO) 7.5-325 MG tablet Take 1 tablet by mouth every 6 (six) hours as needed for moderate pain (pain score 4-6).   levothyroxine  (SYNTHROID ) 25 MCG tablet Take 1 tablet (25 mcg total) by mouth daily.   montelukast  (SINGULAIR ) 10 MG tablet Take 1 tablet (10 mg total) by mouth at bedtime.   Pediatric Multivit-Minerals (FLINTSTONES COMPLETE PO) Take by mouth.   tretinoin  (RETIN-A ) 0.025 % cream Apply topically at bedtime.   triamcinolone  cream (KENALOG ) 0.1 % Apply 1 Application topically 2 (two) times daily.   [DISCONTINUED] ursodiol (ACTIGALL) 300 MG capsule Take 300  mg by mouth 2 (two) times daily.   [DISCONTINUED] baclofen  (LIORESAL ) 10 MG tablet Take 1 tablet (10 mg total) by mouth 3 (three) times daily.   [DISCONTINUED] torsemide (DEMADEX) 20 MG tablet TAKE 1 TABLET BY MOUTH ONCE DAILY. START WITH 1 TABLET AND REDURE TO 1/2 (ONE-HALF) TABLET AS DIRECTED (Patient not taking: Reported on 11/14/2022)   No facility-administered encounter medications on file as of 02/06/2023.    Allergies  Allergen Reactions   Keflex [Cephalexin] Nausea And Vomiting    Review of Systems As per HPI Objective:  BP 103/69   Pulse 77   Temp 98.5 F (36.9 C)   Ht 5' 3 (1.6 m)   Wt 196 lb (88.9 kg)   SpO2 97%   BMI 34.72 kg/m    Wt Readings from Last 3 Encounters:  02/06/23 196 lb (88.9 kg)  01/16/23 199 lb (90.3 kg)  11/14/22 208 lb 12.8 oz (94.7 kg)    Physical Exam Constitutional:      General: She is awake. She is not in acute distress.    Appearance: Normal appearance. She is well-developed and well-groomed. She is obese. She is not ill-appearing,  toxic-appearing or diaphoretic.  Cardiovascular:     Rate and Rhythm: Normal rate and regular rhythm.     Pulses: Normal pulses.          Radial pulses are 2+ on the right side and 2+ on the left side.       Dorsalis pedis pulses are 2+ on the right side and 2+ on the left side.       Posterior tibial pulses are 2+ on the right side and 2+ on the left side.     Heart sounds: Normal heart sounds. No murmur heard.    No gallop.  Pulmonary:     Effort: Pulmonary effort is normal. No respiratory distress.     Breath sounds: Normal breath sounds. No stridor. No wheezing, rhonchi or rales.  Musculoskeletal:     Cervical back: Full passive range of motion without pain and neck supple.     Right lower leg: No edema.     Left lower leg: No edema.     Right foot: Normal range of motion. No deformity, bunion, Charcot foot, foot drop or prominent metatarsal heads.     Left foot: Normal range of motion. No deformity, bunion, Charcot foot, foot drop or prominent metatarsal heads.  Feet:     Right foot:     Protective Sensation: 10 sites tested.  10 sites sensed.     Skin integrity: Skin integrity normal.     Toenail Condition: Right toenails are normal.     Left foot:     Protective Sensation: 10 sites tested.  10 sites sensed.     Skin integrity: Skin integrity normal.     Toenail Condition: Left toenails are normal.  Skin:    General: Skin is warm.     Capillary Refill: Capillary refill takes less than 2 seconds.  Neurological:     General: No focal deficit present.     Mental Status: She is alert, oriented to person, place, and time and easily aroused. Mental status is at baseline.     GCS: GCS eye subscore is 4. GCS verbal subscore is 5. GCS motor subscore is 6.     Motor: No weakness.  Psychiatric:        Attention and Perception: Attention and perception normal.        Mood  and Affect: Mood and affect normal.        Speech: Speech normal.        Behavior: Behavior normal. Behavior is  cooperative.        Thought Content: Thought content normal. Thought content does not include homicidal or suicidal ideation. Thought content does not include homicidal or suicidal plan.        Cognition and Memory: Cognition and memory normal.        Judgment: Judgment normal.     Results for orders placed or performed in visit on 01/17/23  Bayer DCA Hb A1c Waived   Collection Time: 01/17/23 10:35 AM  Result Value Ref Range   HB A1C (BAYER DCA - WAIVED) 5.0 4.8 - 5.6 %  CMP14+EGFR   Collection Time: 01/17/23 10:36 AM  Result Value Ref Range   Glucose 73 70 - 99 mg/dL   BUN 27 (H) 6 - 24 mg/dL   Creatinine, Ser 9.21 0.57 - 1.00 mg/dL   eGFR 94 >40 fO/fpw/8.26   BUN/Creatinine Ratio 35 (H) 9 - 23   Sodium 141 134 - 144 mmol/L   Potassium 4.6 3.5 - 5.2 mmol/L   Chloride 104 96 - 106 mmol/L   CO2 24 20 - 29 mmol/L   Calcium 9.6 8.7 - 10.2 mg/dL   Total Protein 6.6 6.0 - 8.5 g/dL   Albumin 4.2 3.9 - 4.9 g/dL   Globulin, Total 2.4 1.5 - 4.5 g/dL   Bilirubin Total 0.5 0.0 - 1.2 mg/dL   Alkaline Phosphatase 108 44 - 121 IU/L   AST 13 0 - 40 IU/L   ALT 11 0 - 32 IU/L  CBC with Differential   Collection Time: 01/17/23 10:36 AM  Result Value Ref Range   WBC 5.0 3.4 - 10.8 x10E3/uL   RBC 4.79 3.77 - 5.28 x10E6/uL   Hemoglobin 14.3 11.1 - 15.9 g/dL   Hematocrit 55.4 65.9 - 46.6 %   MCV 93 79 - 97 fL   MCH 29.9 26.6 - 33.0 pg   MCHC 32.1 31.5 - 35.7 g/dL   RDW 87.4 88.2 - 84.5 %   Platelets 241 150 - 450 x10E3/uL   Neutrophils 49 Not Estab. %   Lymphs 44 Not Estab. %   Monocytes 6 Not Estab. %   Eos 0 Not Estab. %   Basos 1 Not Estab. %   Neutrophils Absolute 2.4 1.4 - 7.0 x10E3/uL   Lymphocytes Absolute 2.2 0.7 - 3.1 x10E3/uL   Monocytes Absolute 0.3 0.1 - 0.9 x10E3/uL   EOS (ABSOLUTE) 0.0 0.0 - 0.4 x10E3/uL   Basophils Absolute 0.0 0.0 - 0.2 x10E3/uL   Immature Granulocytes 0 Not Estab. %   Immature Grans (Abs) 0.0 0.0 - 0.1 x10E3/uL  Iron, TIBC and Ferritin Panel    Collection Time: 01/17/23 10:36 AM  Result Value Ref Range   Total Iron Binding Capacity 296 250 - 450 ug/dL   UIBC 818 868 - 574 ug/dL   Iron 884 27 - 840 ug/dL   Iron Saturation 39 15 - 55 %   Ferritin 64 15 - 150 ng/mL       02/06/2023   11:42 AM 11/14/2022   12:09 PM 09/27/2022    8:34 AM 09/05/2022   11:01 AM 06/06/2022    9:57 AM  Depression screen PHQ 2/9  Decreased Interest 0 0 0 0 0  Down, Depressed, Hopeless 0 0 0 0 0  PHQ - 2 Score 0 0 0 0 0  Altered  sleeping 1 1 1 2  0  Tired, decreased energy 1 1 1 2  0  Change in appetite 0 0 0 0 0  Feeling bad or failure about yourself  0 0 0 0 0  Trouble concentrating 1 1 1 1  0  Moving slowly or fidgety/restless 0 0 0 0 0  Suicidal thoughts 0 0 0 0 0  PHQ-9 Score 3 3 3 5  0  Difficult doing work/chores Somewhat difficult Somewhat difficult Not difficult at all Somewhat difficult Not difficult at all       02/06/2023   11:43 AM 11/14/2022   12:10 PM 09/05/2022   11:01 AM 06/06/2022    9:57 AM  GAD 7 : Generalized Anxiety Score  Nervous, Anxious, on Edge 0 0 0 0  Control/stop worrying 0 0 2 0  Worry too much - different things 0 0 2 0  Trouble relaxing 1 1 0 0  Restless 1 0 0 0  Easily annoyed or irritable 0 1 1 0  Afraid - awful might happen 0 0 0 0  Total GAD 7 Score 2 2 5  0  Anxiety Difficulty Somewhat difficult Somewhat difficult Not difficult at all Not difficult at all   Pertinent labs & imaging results that were available during my care of the patient were reviewed by me and considered in my medical decision making.  Assessment & Plan:  Selena Howard was seen today for numbness and tingling.  Diagnoses and all orders for this visit:  1. Numbness and tingling of both feet (Primary) Will start with labs as below. Discussed with pt to follow up with PCP and with neurosurgery as this may be related to fibromyalgia or radiculopathy from spinous process changes. Discussed with patient option of pregablin and gabapentin . She has tried  both of these in the past and did not like side effects. Encouraged her to follow up with PCP, neurosurgery, possibly pain management to discuss options for treatment.  Will collect labs as below to assess for vitamin deficiency in light of recent gastric sleeve. Reviewed imaging as below.  - Anemia Profile B - TSH - VITAMIN D  25 Hydroxy (Vit-D Deficiency, Fractures) - BMP8+EGFR  MR Cervical Spine WO Contrast  IMPRESSION: 1. Cervical spine spondylosis as described above. 2. No acute osseous injury of the cervical spine.     Electronically Signed   By: Julaine Blanch M.D.   On: 10/06/2022 09:15  XR Lumbar Spine Complete Barbarann Anes, MD. 12/16/2020 [facility] lateral lumbar spine images with lateral flexion-extension views  obtained and reviewed.  This shows tiny endplate spurring mid lumbar  region without disc base narrowing.  Minimal facet enlargement at L5-S1.   Negative for compression fracture no spondylolisthesis.   Impression: Minimal endplate lumbar changes with mild facet degenerative  changes.   2. Subclinical hypothyroidism Labs as below. Will communicate results to patient once available. Will await results to determine next steps.  - TSH  3. Paresthesia Labs as below. Will communicate results to patient once available. Will await results to determine next steps.  As above.  - Anemia Profile B - TSH - VITAMIN D  25 Hydroxy (Vit-D Deficiency, Fractures) - BMP8+EGFR  4. History of sleeve gastrectomy Labs as below. Will communicate results to patient once available. Will await results to determine next steps.  - Anemia Profile B - TSH - VITAMIN D  25 Hydroxy (Vit-D Deficiency, Fractures) - BMP8+EGFR  5. Fibromyalgia As above.   Continue all other maintenance medications.  Follow up plan: Return if symptoms  worsen or fail to improve.   Continue healthy lifestyle choices, including diet (rich in fruits, vegetables, and lean proteins, and low in salt and simple  carbohydrates) and exercise (at least 30 minutes of moderate physical activity daily).  Written and verbal instructions provided   The above assessment and management plan was discussed with the patient. The patient verbalized understanding of and has agreed to the management plan. Patient is aware to call the clinic if they develop any new symptoms or if symptoms persist or worsen. Patient is aware when to return to the clinic for a follow-up visit. Patient educated on when it is appropriate to go to the emergency department.   Marry Kins, DNP-FNP [facility] [address] [place], [place] [address] [phone]

## 2023-02-07 ENCOUNTER — Encounter: Payer: Self-pay | Admitting: Family Medicine

## 2023-02-07 LAB — ANEMIA PROFILE B
Basophils Absolute: 0 10*3/uL (ref 0.0–0.2)
Basos: 1 %
EOS (ABSOLUTE): 0 10*3/uL (ref 0.0–0.4)
Eos: 0 %
Ferritin: 45 ng/mL (ref 15–150)
Folate: 14.3 ng/mL (ref >3.0)
Hematocrit: 43 % (ref 34.0–46.6)
Hemoglobin: 14.2 g/dL (ref 11.1–15.9)
Immature Grans (Abs): 0 10*3/uL (ref 0.0–0.1)
Immature Granulocytes: 0 %
Iron Saturation: 46 % (ref 15–55)
Iron: 111 ug/dL (ref 27–159)
Lymphocytes Absolute: 2.1 10*3/uL (ref 0.7–3.1)
Lymphs: 36 %
MCH: 30.9 pg (ref 26.6–33.0)
MCHC: 33 g/dL (ref 31.5–35.7)
MCV: 94 fL (ref 79–97)
Monocytes Absolute: 0.3 10*3/uL (ref 0.1–0.9)
Monocytes: 6 %
Neutrophils Absolute: 3.4 10*3/uL (ref 1.4–7.0)
Neutrophils: 57 %
Platelets: 243 10*3/uL (ref 150–450)
RBC: 4.6 x10E6/uL (ref 3.77–5.28)
RDW: 12.9 % (ref 11.7–15.4)
Retic Ct Pct: 1.3 % (ref 0.6–2.6)
Total Iron Binding Capacity: 242 ug/dL — ABNORMAL LOW (ref 250–450)
UIBC: 131 ug/dL (ref 131–425)
Vitamin B-12: 473 pg/mL (ref 232–1245)
WBC: 5.9 10*3/uL (ref 3.4–10.8)

## 2023-02-07 LAB — BMP8+EGFR
BUN/Creatinine Ratio: 41 — ABNORMAL HIGH (ref 9–23)
BUN: 28 mg/dL — ABNORMAL HIGH (ref 6–24)
CO2: 25 mmol/L (ref 20–29)
Calcium: 9 mg/dL (ref 8.7–10.2)
Chloride: 103 mmol/L (ref 96–106)
Creatinine, Ser: 0.68 mg/dL (ref 0.57–1.00)
Glucose: 82 mg/dL (ref 70–99)
Potassium: 4.2 mmol/L (ref 3.5–5.2)
Sodium: 141 mmol/L (ref 134–144)
eGFR: 108 mL/min/{1.73_m2} (ref >59)

## 2023-02-07 LAB — TSH: TSH: 0.718 u[IU]/mL (ref 0.450–4.500)

## 2023-02-07 LAB — VITAMIN D 25 HYDROXY (VIT D DEFICIENCY, FRACTURES): Vit D, 25-Hydroxy: 85.6 ng/mL (ref 30.0–100.0)

## 2023-02-07 NOTE — Progress Notes (Signed)
 Slightly decreased TIBC. Not causing anemia. Slight abnormalities on BMP that indicate dehydration. Recommend increase water intake to 80-100 oz of water daily. Recommend patient follow up with PCP

## 2023-02-28 ENCOUNTER — Ambulatory Visit: Payer: Medicaid Other | Admitting: Family Medicine

## 2023-02-28 ENCOUNTER — Encounter: Payer: Self-pay | Admitting: Family Medicine

## 2023-02-28 VITALS — BP 98/67 | HR 88 | Temp 97.9°F | Ht 63.0 in | Wt 195.0 lb

## 2023-02-28 DIAGNOSIS — R202 Paresthesia of skin: Secondary | ICD-10-CM

## 2023-02-28 DIAGNOSIS — R200 Anesthesia of skin: Secondary | ICD-10-CM | POA: Diagnosis not present

## 2023-02-28 DIAGNOSIS — M5412 Radiculopathy, cervical region: Secondary | ICD-10-CM | POA: Diagnosis not present

## 2023-02-28 DIAGNOSIS — M797 Fibromyalgia: Secondary | ICD-10-CM

## 2023-02-28 DIAGNOSIS — F329 Major depressive disorder, single episode, unspecified: Secondary | ICD-10-CM | POA: Diagnosis not present

## 2023-02-28 MED ORDER — DULOXETINE HCL 60 MG PO CPEP: 60.0000 mg | ORAL_CAPSULE | Freq: Every day | ORAL | 3 refills | Status: AC

## 2023-02-28 MED ORDER — ALPHA-LIPOIC ACID ER 600 MG PO TB24: 600.0000 mg | ORAL_TABLET | Freq: Every day | ORAL | 3 refills | Status: DC

## 2023-02-28 MED ORDER — DULOXETINE HCL 30 MG PO CPEP: 30.0000 mg | ORAL_CAPSULE | Freq: Every day | ORAL | 3 refills | Status: AC

## 2023-02-28 NOTE — Progress Notes (Unsigned)
 Subjective: CC:*** PCP: Raliegh Ip, DO NWG:NFAO Holroyd is a 48 y.o. female presenting to clinic today for:  1. ***   ROS: Per HPI  Allergies  Allergen Reactions   Keflex [Cephalexin] Nausea And Vomiting   Past Medical History:  Diagnosis Date   Anxiety    Arthritis    Endometriosis    now s/p partial hysterectomy 06/2020   GERD (gastroesophageal reflux disease)    Osteoarthritis    Spinal stenosis at L4-L5 level     Current Outpatient Medications:    betamethasone valerate (VALISONE) 0.1 % cream, Apply 1 Application topically daily as needed (foor rash)., Disp: , Rfl:    Biotin 1000 MCG CHEW, Chew 2 Pieces of gum by mouth daily., Disp: , Rfl:    calcium carbonate (OS-CAL) 600 MG tablet, Take 600 mg by mouth 2 (two) times daily with a meal., Disp: , Rfl:    Cholecalciferol (VITAMIN D3) 50 MCG (2000 UT) CHEW, Chew 2,000 Pieces of gum by mouth daily., Disp: , Rfl:    DULoxetine (CYMBALTA) 60 MG capsule, Take 1 capsule by mouth once daily, Disp: 90 capsule, Rfl: 0   esomeprazole (NEXIUM) 40 MG capsule, Take 1 capsule (40 mg total) by mouth daily., Disp: 90 capsule, Rfl: 3   FIBER PO, Take by mouth., Disp: , Rfl:    fluticasone (FLONASE) 50 MCG/ACT nasal spray, Place 2 sprays into both nostrils daily., Disp: 16 g, Rfl: 2   HYDROcodone-acetaminophen (NORCO) 7.5-325 MG tablet, Take 1 tablet by mouth every 6 (six) hours as needed for moderate pain (pain score 4-6)., Disp: , Rfl:    levothyroxine (SYNTHROID) 25 MCG tablet, Take 1 tablet (25 mcg total) by mouth daily., Disp: 90 tablet, Rfl: 3   montelukast (SINGULAIR) 10 MG tablet, Take 1 tablet (10 mg total) by mouth at bedtime., Disp: 90 tablet, Rfl: 3   Pediatric Multivit-Minerals (FLINTSTONES COMPLETE PO), Take by mouth., Disp: , Rfl:    tretinoin (RETIN-A) 0.025 % cream, Apply topically at bedtime., Disp: 45 g, Rfl: 0   triamcinolone cream (KENALOG) 0.1 %, Apply 1 Application topically 2 (two) times daily., Disp: 30  g, Rfl: 0 Social History   Socioeconomic History   Marital status: Divorced    Spouse name: Not on file   Number of children: 3   Years of education: 12   Highest education level: High school graduate  Occupational History   Not on file  Tobacco Use   Smoking status: Former    Current packs/day: 0.00    Average packs/day: 0.5 packs/day for 10.0 years (5.0 ttl pk-yrs)    Types: Cigarettes    Start date: 06/03/2010    Quit date: 06/02/2020    Years since quitting: 2.7    Passive exposure: Past   Smokeless tobacco: Never  Vaping Use   Vaping status: Never Used  Substance and Sexual Activity   Alcohol use: Not Currently   Drug use: Not Currently   Sexual activity: Not Currently    Birth control/protection: Surgical  Other Topics Concern   Not on file  Social History Narrative   Right handed   Caffeine use: no tea, Coffee-tries to drink decaf, diet sodas   Mother also sees me, Anne Shutter   Social Drivers of Health   Financial Resource Strain: Patient Declined (03/14/2022)   Received from [facility], [facility]   Overall Financial Resource Strain (CARDIA)    Difficulty of Paying Living Expenses: Patient declined  Food Insecurity: Patient Declined (03/14/2022)  Received from [facility], [facility]   Hunger Vital Sign    Worried About Running Out of Food in the Last Year: Patient declined    Ran Out of Food in the Last Year: Patient declined  Transportation Needs: No Transportation Needs (04/16/2022)   Received from [facility], [facility]   PRAPARE - Transportation    Lack of Transportation (Medical): No    Lack of Transportation (Non-Medical): No  Physical Activity: Not on file  Stress: No Stress Concern Present (04/16/2022)   Received from [facility], [facility] - Occupational Stress Questionnaire    Feeling of Stress : Not at all  Social Connections: Unknown (03/01/2022)   Received from [facility],  [facility]   Social Network    Social Network: Not on file  Intimate Partner Violence: Not At Risk (04/16/2022)   Received from [facility], [facility]   HITS    Over the last 12 months how often did your partner physically hurt you?: Never    Over the last 12 months how often did your partner insult you or talk down to you?: Never    Over the last 12 months how often did your partner threaten you with physical harm?: Never    Over the last 12 months how often did your partner scream or curse at you?: Never   Family History  Problem Relation Age of Onset   Diabetes Mother    Liver disease Mother    COPD Father    Diabetes Father    Heart disease Maternal Grandmother    Cancer Maternal Grandfather    Cancer Paternal Grandmother    Diabetes Paternal Grandfather    Breast cancer Neg Hx     Objective: Office vital signs reviewed. There were no vitals taken for this visit.  Physical Examination:  General: Awake, alert, *** nourished, No acute distress HEENT: Normal    Neck: No masses palpated. No lymphadenopathy    Ears: Tympanic membranes intact, normal light reflex, no erythema, no bulging    Eyes: PERRLA, extraocular membranes intact, sclera ***    Nose: nasal turbinates moist, *** nasal discharge    Throat: moist mucus membranes, no erythema, *** tonsillar exudate.  Airway is patent Cardio: regular rate and rhythm, S1S2 heard, no murmurs appreciated Pulm: clear to auscultation bilaterally, no wheezes, rhonchi or rales; normal work of breathing on room air GI: soft, non-tender, non-distended, bowel sounds present x4, no hepatomegaly, no splenomegaly, no masses GU: external vaginal tissue ***, cervix ***, *** punctate lesions on cervix appreciated, *** discharge from cervical os, *** bleeding, *** cervical motion tenderness, *** abdominal/ adnexal masses Extremities: warm, well perfused, No edema, cyanosis or clubbing; +*** pulses bilaterally MSK: *** gait and ***  station Skin: dry; intact; no rashes or lesions Neuro: *** Strength and light touch sensation grossly intact, *** DTRs ***/4  Assessment/ Plan: 48 y.o. female   No diagnosis found.  ***   Kinslee Dalpe Hulen Skains, DO [facility] [phone]

## 2023-03-01 ENCOUNTER — Encounter: Payer: Self-pay | Admitting: Family Medicine

## 2023-04-12 ENCOUNTER — Other Ambulatory Visit: Payer: Self-pay | Admitting: Family Medicine

## 2023-04-12 DIAGNOSIS — E038 Other specified hypothyroidism: Secondary | ICD-10-CM

## 2023-05-08 IMAGING — MG MM DIGITAL SCREENING BILAT W/ TOMO AND CAD
8 series · 8 of 24 positions shown · non-contrast
Comparison: Previous exam(s).

CLINICAL DATA: Screening.

EXAM:
DIGITAL SCREENING BILATERAL MAMMOGRAM WITH TOMOSYNTHESIS AND CAD
TECHNIQUE: Bilateral screening digital craniocaudal and mediolateral oblique
mammograms were obtained. Bilateral screening digital breast
tomosynthesis was performed. The images were evaluated with
computer-aided detection.

[R CC synth-2D]
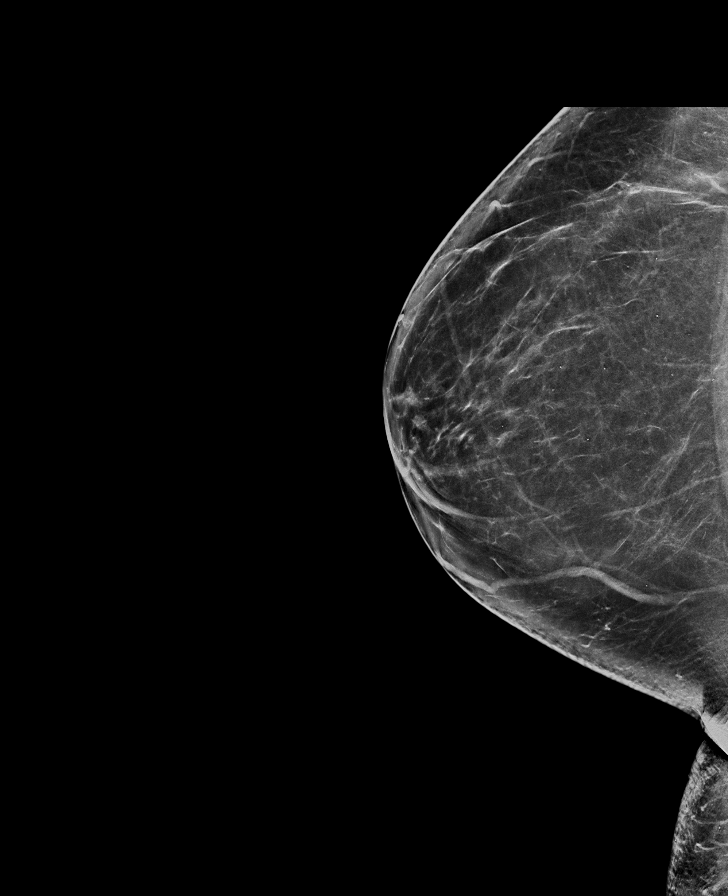

[L MLO synth-2D]
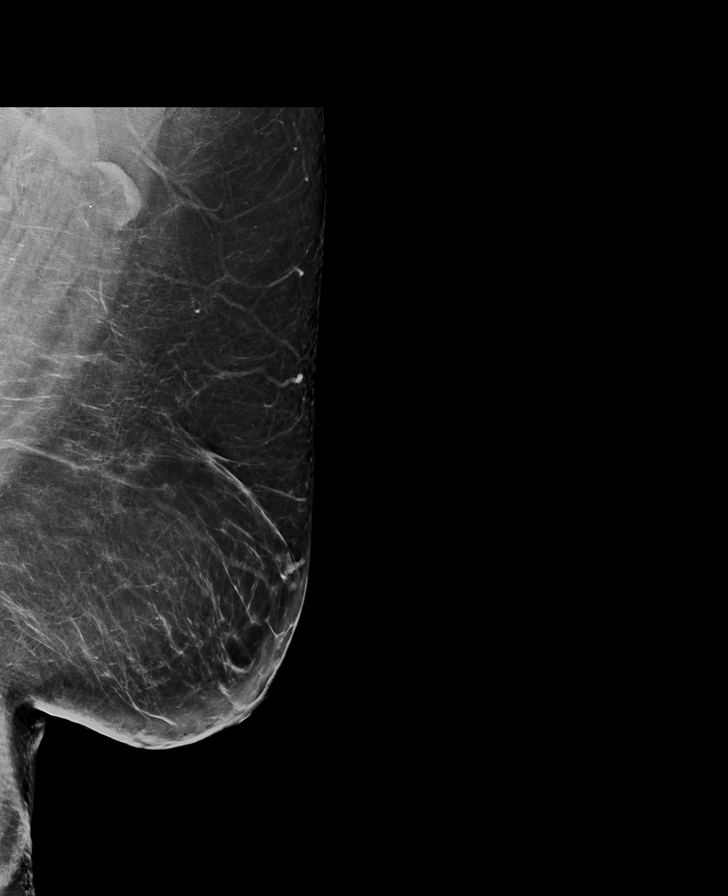

[R MLO synth-2D]
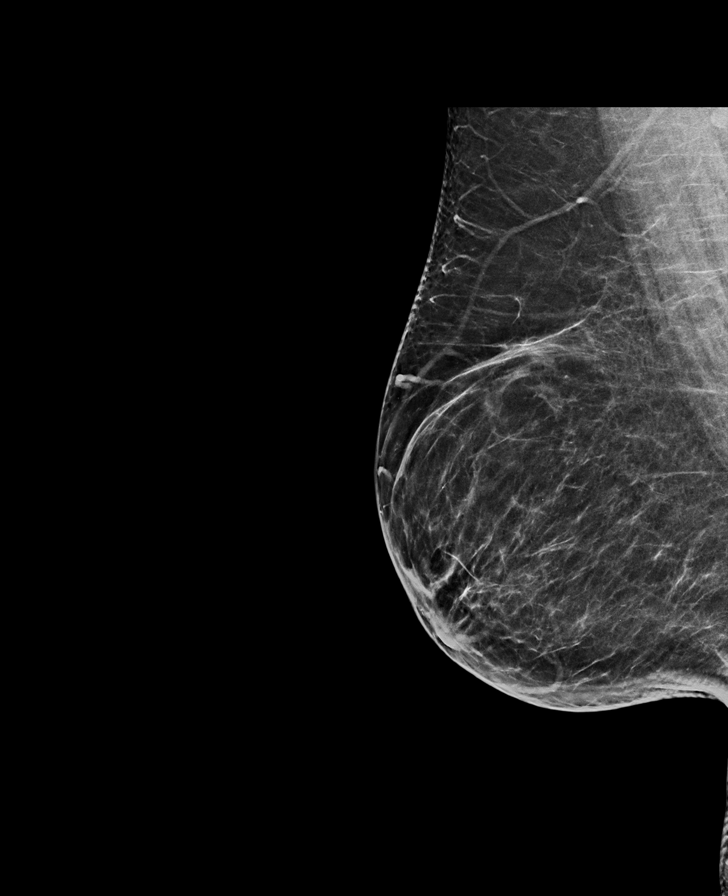

[L CC synth-2D]
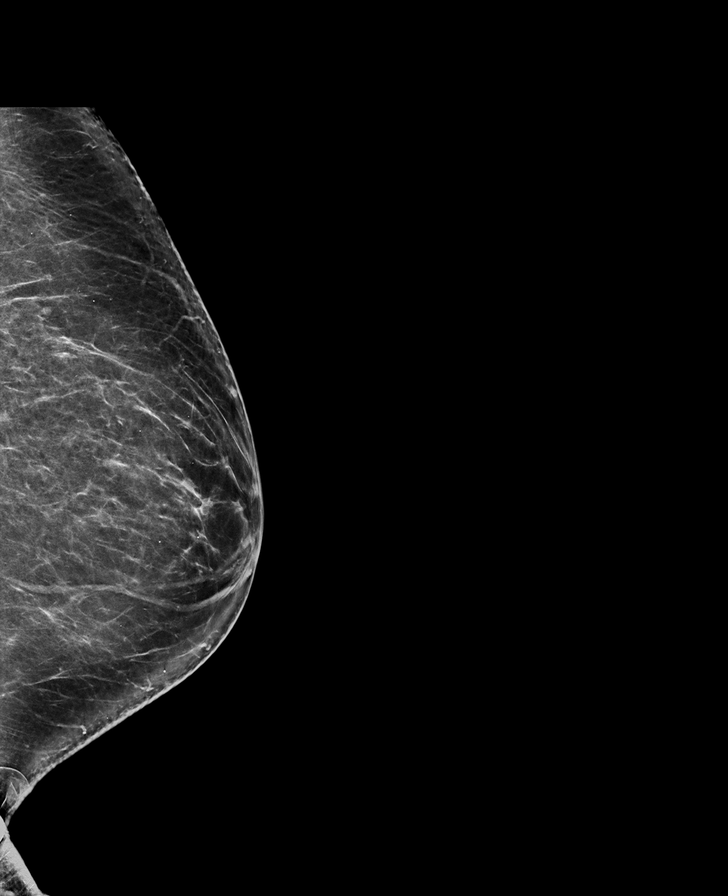

[L MLO tomo · tomo slice 46/91.0]
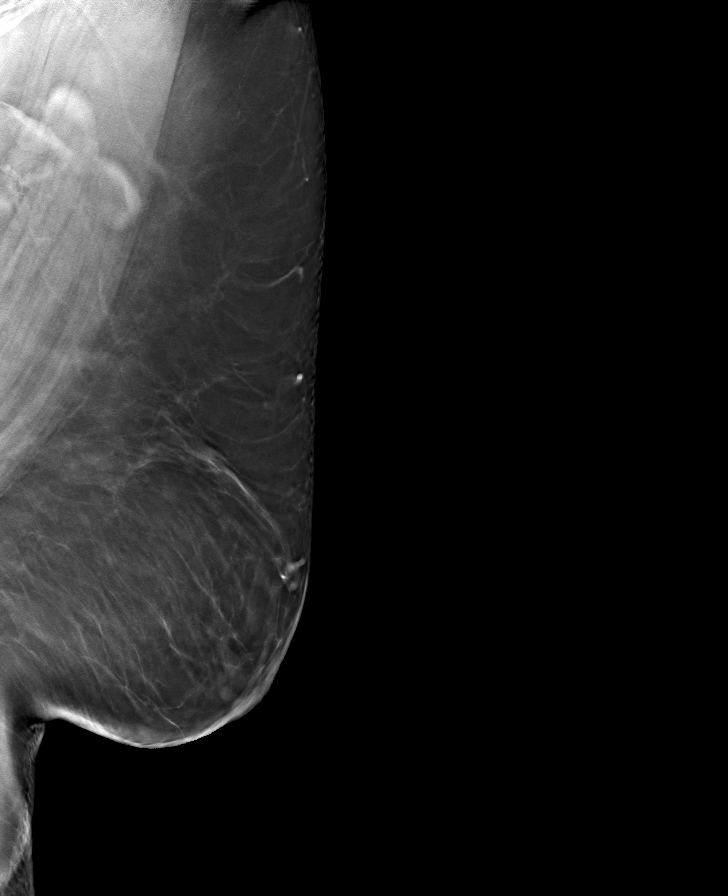

[L CC tomo · tomo slice 39/78.0]
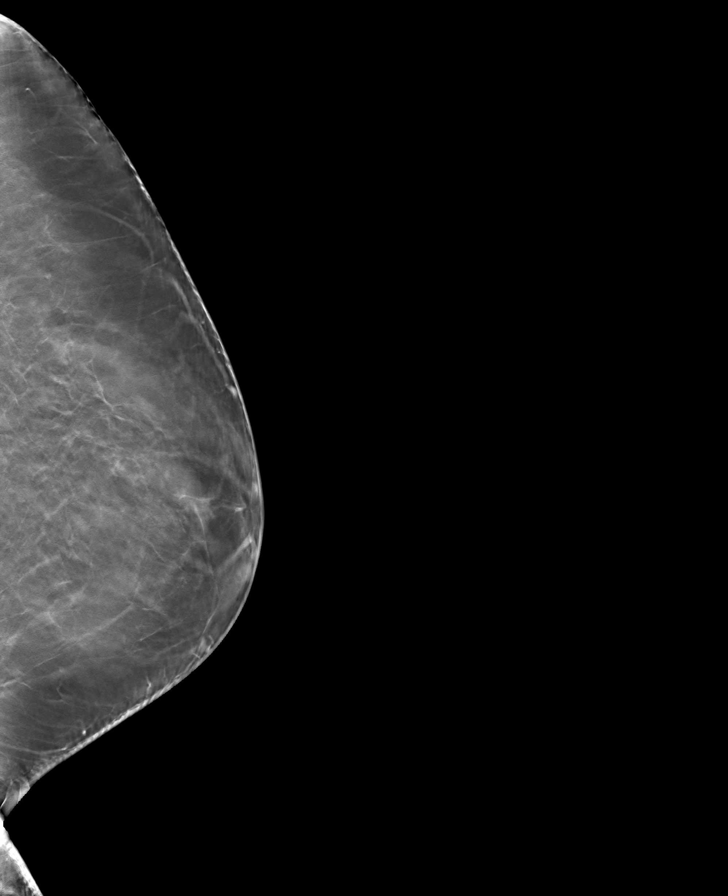

[R MLO tomo · tomo slice 31/60.0]
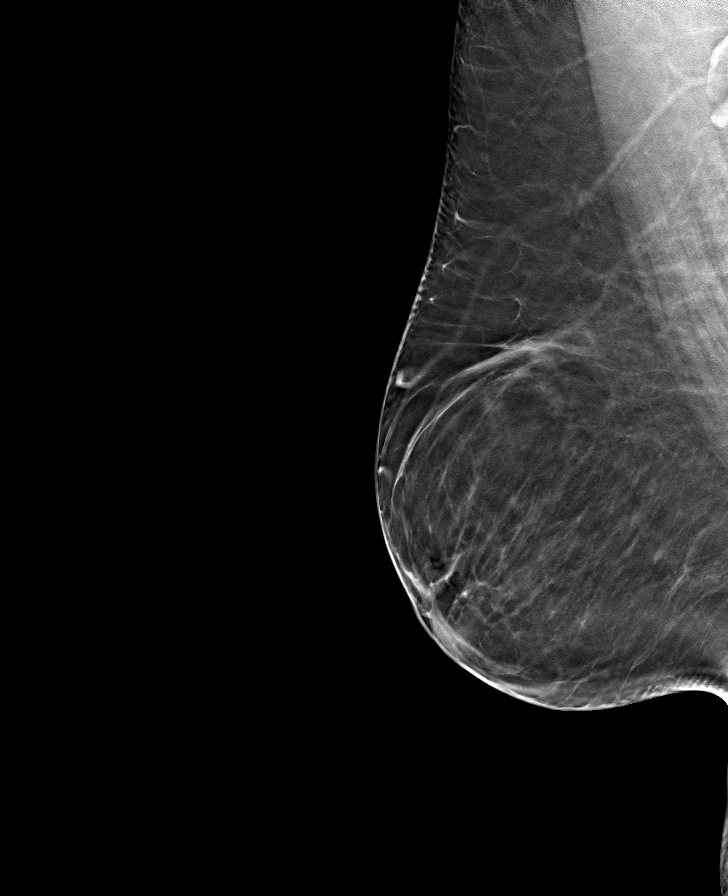

[R CC tomo · tomo slice 41/82.0]
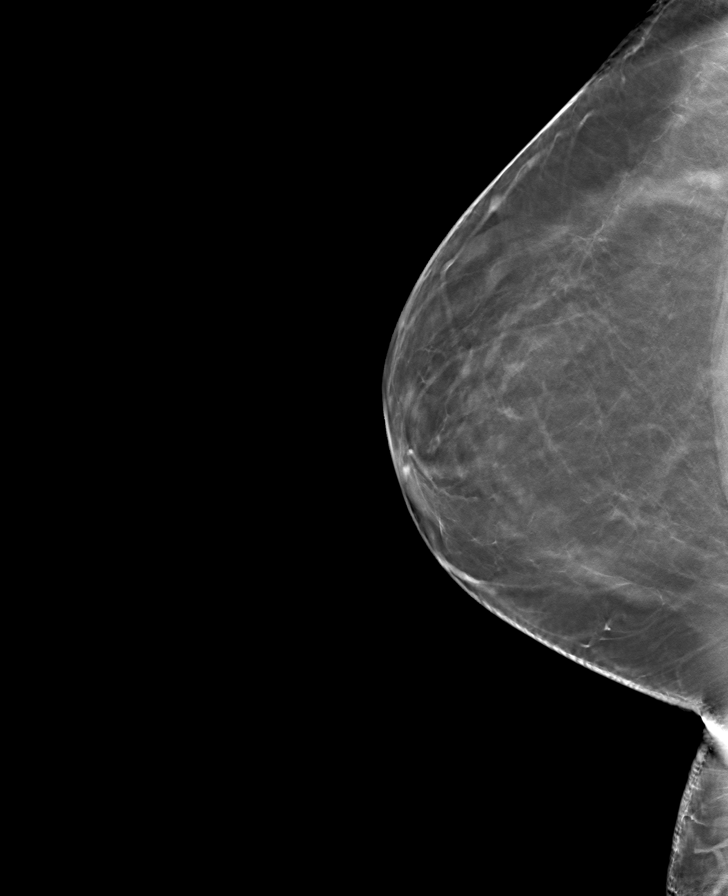

[8 of 24 positions shown; findings below may reference images not displayed]

ACR Breast Density Category b: There are scattered areas of
fibroglandular density.
FINDINGS: There are no findings suspicious for malignancy.
IMPRESSION: No mammographic evidence of malignancy. A result letter of this
screening mammogram will be mailed directly to the patient.

RECOMMENDATION:
Screening mammogram in one year. (Code:51-O-LD2)

BI-RADS CATEGORY  1: Negative.

## 2023-05-29 ENCOUNTER — Encounter: Payer: Self-pay | Admitting: Family Medicine

## 2023-05-29 ENCOUNTER — Ambulatory Visit: Payer: Medicaid Other | Admitting: Family Medicine

## 2023-05-29 VITALS — BP 110/64 | HR 77 | Temp 98.5°F | Ht 63.0 in | Wt 192.6 lb

## 2023-05-29 DIAGNOSIS — M5412 Radiculopathy, cervical region: Secondary | ICD-10-CM | POA: Diagnosis not present

## 2023-05-29 DIAGNOSIS — R200 Anesthesia of skin: Secondary | ICD-10-CM

## 2023-05-29 DIAGNOSIS — F329 Major depressive disorder, single episode, unspecified: Secondary | ICD-10-CM

## 2023-05-29 DIAGNOSIS — R202 Paresthesia of skin: Secondary | ICD-10-CM

## 2023-05-29 DIAGNOSIS — M797 Fibromyalgia: Secondary | ICD-10-CM | POA: Diagnosis not present

## 2023-05-29 DIAGNOSIS — E038 Other specified hypothyroidism: Secondary | ICD-10-CM

## 2023-05-29 NOTE — Progress Notes (Signed)
 Subjective: YN:WGNF/ pain PCP: Selena Guerin, DO AOZ:HYQM Selena Howard is a 48 y.o. female presenting to clinic today for:  1. Mood/ fibromyalgia/ neuropathy Cymbalta  increased to 90mg  daily last visit and alpha lipoic acid added. She notes improvement in mood but hasn't really seen any change in pain/ etc.  She was referred to Dr Vaughn Georges for the cervical radiculopathy after her shots in the spine were ineffective.  She continues to get radicular symptoms down the arms.  He stated that she had a collapsed cervical disc and that she would have to undergo 6 weeks of physical therapy.  She feels like she is starting all over but is hopeful that he will get this issue resolved for her.  2.  Hypothyroidism Compliant with Synthroid .  No reports of tremor, heart palpitations or changes in bowel habits   ROS: Per HPI  Allergies  Allergen Reactions   Keflex [Cephalexin] Nausea And Vomiting   Past Medical History:  Diagnosis Date   Anxiety    Arthritis    Endometriosis    now s/p partial hysterectomy 06/2020   GERD (gastroesophageal reflux disease)    Osteoarthritis    Spinal stenosis at L4-L5 level     Current Outpatient Medications:    Alpha-Lipoic Acid ER 600 MG TB24, Take 600 mg by mouth daily. For neuropathy, Disp: 90 tablet, Rfl: 3   betamethasone  valerate (VALISONE ) 0.1 % cream, Apply 1 Application topically daily as needed (foor rash)., Disp: , Rfl:    Biotin 1000 MCG CHEW, Chew 2 Pieces of gum by mouth daily., Disp: , Rfl:    calcium carbonate (OS-CAL) 600 MG tablet, Take 600 mg by mouth 2 (two) times daily with a meal., Disp: , Rfl:    Cholecalciferol (VITAMIN D3) 50 MCG (2000 UT) CHEW, Chew 2,000 Pieces of gum by mouth daily., Disp: , Rfl:    DULoxetine  (CYMBALTA ) 30 MG capsule, Take 1 capsule (30 mg total) by mouth daily. Take with 60mg  to equal 90mg  per day, Disp: 90 capsule, Rfl: 3   DULoxetine  (CYMBALTA ) 60 MG capsule, Take 1 capsule (60 mg total) by mouth daily., Disp:  90 capsule, Rfl: 3   esomeprazole  (NEXIUM ) 40 MG capsule, Take 1 capsule (40 mg total) by mouth daily., Disp: 90 capsule, Rfl: 3   FIBER PO, Take by mouth., Disp: , Rfl:    fluticasone  (FLONASE ) 50 MCG/ACT nasal spray, Place 2 sprays into both nostrils daily., Disp: 16 g, Rfl: 2   HYDROcodone-acetaminophen  (NORCO) 7.5-325 MG tablet, Take 1 tablet by mouth every 6 (six) hours as needed for moderate pain (pain score 4-6)., Disp: , Rfl:    levothyroxine  (SYNTHROID ) 25 MCG tablet, Take 1 tablet by mouth once daily, Disp: 90 tablet, Rfl: 3   montelukast  (SINGULAIR ) 10 MG tablet, Take 1 tablet (10 mg total) by mouth at bedtime., Disp: 90 tablet, Rfl: 3   Pediatric Multivit-Minerals (FLINTSTONES COMPLETE PO), Take by mouth., Disp: , Rfl:    tretinoin  (RETIN-A ) 0.025 % cream, Apply topically at bedtime., Disp: 45 g, Rfl: 0   triamcinolone  cream (KENALOG ) 0.1 %, Apply 1 Application topically 2 (two) times daily., Disp: 30 g, Rfl: 0 Social History   Socioeconomic History   Marital status: Divorced    Spouse name: Not on file   Number of children: 3   Years of education: 12   Highest education level: High school graduate  Occupational History   Not on file  Tobacco Use   Smoking status: Former    Current  packs/day: 0.00    Average packs/day: 0.5 packs/day for 10.0 years (5.0 ttl pk-yrs)    Types: Cigarettes    Start date: 06/03/2010    Quit date: 06/02/2020    Years since quitting: 2.9    Passive exposure: Past   Smokeless tobacco: Never  Vaping Use   Vaping status: Never Used  Substance and Sexual Activity   Alcohol use: Not Currently   Drug use: Not Currently   Sexual activity: Not Currently    Birth control/protection: Surgical  Other Topics Concern   Not on file  Social History Narrative   Right handed   Caffeine use: no tea, Coffee-tries to drink decaf, diet sodas   Mother also sees me, [place]   Social Drivers of Health   Financial Resource Strain: Patient Declined  (03/14/2022)   Received from [facility], [facility]   Overall Financial Resource Strain (CARDIA)    Difficulty of Paying Living Expenses: Patient declined  Food Insecurity: Patient Declined (03/14/2022)   Received from [facility], [facility]   Hunger Vital Sign    Worried About Running Out of Food in the Last Year: Patient declined    Ran Out of Food in the Last Year: Patient declined  Transportation Needs: No Transportation Needs (04/16/2022)   Received from [employer], [facility]   PRAPARE - Transportation    Lack of Transportation (Medical): No    Lack of Transportation (Non-Medical): No  Physical Activity: Not on file  Stress: No Stress Concern Present (04/16/2022)   Received from [facility], [facility] of Occupational Health - Occupational Stress Questionnaire    Feeling of Stress : Not at all  Social Connections: Unknown (03/01/2022)   Received from [facility], [facility]   Social Network    Social Network: Not on file  Intimate Partner Violence: Not At Risk (04/16/2022)   Received from [facility], [facility]   HITS    Over the last 12 months how often did your partner physically hurt you?: Never    Over the last 12 months how often did your partner insult you or talk down to you?: Never    Over the last 12 months how often did your partner threaten you with physical harm?: Never    Over the last 12 months how often did your partner scream or curse at you?: Never   Family History  Problem Relation Age of Onset   Diabetes Mother    Liver disease Mother    COPD Father    Diabetes Father    Heart disease Maternal Grandmother    Cancer Maternal Grandfather    Cancer Paternal Grandmother    Diabetes Paternal Grandfather    Breast cancer Neg Hx     Objective: Office vital signs reviewed. BP 110/64   Pulse 77   Temp 98.5 F (36.9 C)   Ht 5\' 3"  (1.6 m)   Wt 192 lb 9.6 oz (87.4 kg)   SpO2 99%   BMI 34.12 kg/m    Physical Examination:  General: Awake, alert, well-appearing female, No acute distress HEENT: Sclera white.  Moist mucous membranes.  No exophthalmos Cardio: regular rate and rhythm, S1S2 heard, no murmurs appreciated Pulm: clear to auscultation bilaterally, no wheezes, rhonchi or rales; normal work of breathing on room air GI: soft, non-tender, non-distended, bowel sounds present x4, no hepatomegaly, no splenomegaly, no masses MSK: Ambulating independently with normal gait and station.  Assessment/ Plan: 48 y.o. female  Reactive depression  Fibromyalgia  Cervical radiculopathy  Numbness and tingling of both feet  Subclinical hypothyroidism - Plan: TSH + free T4  Cymbalta  increased dose doing well.  Continue current regimen.  Hopefully will see improvement in her fibromyalgia as other issues improve like the cervical radiculopathy  We went ahead and collected some thyroid  levels for her today but she is up-to-date on all other labs and did not require anything else  Selena Guerin, DO [facility] [phone]

## 2023-05-30 ENCOUNTER — Ambulatory Visit: Payer: Self-pay | Admitting: Family Medicine

## 2023-05-30 LAB — TSH+FREE T4
Free T4: 1 ng/dL (ref 0.82–1.77)
TSH: 0.929 u[IU]/mL (ref 0.450–4.500)

## 2023-06-05 ENCOUNTER — Other Ambulatory Visit (HOSPITAL_COMMUNITY): Payer: Self-pay | Admitting: Orthopedic Surgery

## 2023-06-05 ENCOUNTER — Other Ambulatory Visit: Payer: Self-pay | Admitting: Family Medicine

## 2023-06-05 DIAGNOSIS — K219 Gastro-esophageal reflux disease without esophagitis: Secondary | ICD-10-CM

## 2023-06-05 DIAGNOSIS — J301 Allergic rhinitis due to pollen: Secondary | ICD-10-CM

## 2023-06-05 DIAGNOSIS — M542 Cervicalgia: Secondary | ICD-10-CM

## 2023-06-08 ENCOUNTER — Ambulatory Visit (HOSPITAL_COMMUNITY)

## 2023-06-13 ENCOUNTER — Encounter (HOSPITAL_COMMUNITY): Payer: Self-pay

## 2023-06-13 ENCOUNTER — Ambulatory Visit (HOSPITAL_COMMUNITY)

## 2023-06-26 ENCOUNTER — Ambulatory Visit (HOSPITAL_COMMUNITY)
Admission: RE | Admit: 2023-06-26 | Discharge: 2023-06-26 | Disposition: A | Discharge status: Home / Self Care | Source: Ambulatory Visit | Attending: Orthopedic Surgery | Admitting: Orthopedic Surgery

## 2023-06-26 DIAGNOSIS — M542 Cervicalgia: Secondary | ICD-10-CM | POA: Insufficient documentation

## 2023-07-04 ENCOUNTER — Other Ambulatory Visit

## 2023-08-29 ENCOUNTER — Other Ambulatory Visit: Payer: Self-pay | Admitting: Family Medicine

## 2023-08-29 DIAGNOSIS — Z1231 Encounter for screening mammogram for malignant neoplasm of breast: Secondary | ICD-10-CM

## 2023-09-19 DIAGNOSIS — G935 Compression of brain: Secondary | ICD-10-CM

## 2023-09-19 HISTORY — DX: Compression of brain: G93.5

## 2023-10-05 ENCOUNTER — Other Ambulatory Visit: Payer: Self-pay | Admitting: Family Medicine

## 2023-10-05 DIAGNOSIS — L989 Disorder of the skin and subcutaneous tissue, unspecified: Secondary | ICD-10-CM

## 2023-10-15 ENCOUNTER — Ambulatory Visit
Admission: RE | Admit: 2023-10-15 | Discharge: 2023-10-15 | Disposition: A | Discharge status: Home / Self Care | Source: Ambulatory Visit | Attending: Family Medicine | Admitting: Family Medicine

## 2023-10-15 DIAGNOSIS — Z1231 Encounter for screening mammogram for malignant neoplasm of breast: Secondary | ICD-10-CM

## 2023-10-17 ENCOUNTER — Encounter (INDEPENDENT_AMBULATORY_CARE_PROVIDER_SITE_OTHER): Payer: Self-pay | Admitting: Gastroenterology

## 2023-10-30 ENCOUNTER — Encounter: Payer: Self-pay | Admitting: Neurology

## 2023-10-30 ENCOUNTER — Ambulatory Visit: Admitting: Neurology

## 2023-10-30 VITALS — BP 123/76 | HR 83 | Resp 17 | Ht 63.0 in | Wt 192.5 lb

## 2023-10-30 DIAGNOSIS — R519 Headache, unspecified: Secondary | ICD-10-CM | POA: Diagnosis not present

## 2023-10-30 DIAGNOSIS — G43709 Chronic migraine without aura, not intractable, without status migrainosus: Secondary | ICD-10-CM | POA: Insufficient documentation

## 2023-10-30 MED ORDER — AIMOVIG 70 MG/ML ~~LOC~~ SOAJ: 70.0000 mg | SUBCUTANEOUS | 11 refills | Status: AC

## 2023-10-30 MED ORDER — SUMATRIPTAN SUCCINATE 50 MG PO TABS: 50.0000 mg | ORAL_TABLET | ORAL | 11 refills | Status: AC | PRN

## 2023-10-30 NOTE — Progress Notes (Signed)
 Chief Complaint  Patient presents with   New Patient (Initial Visit)    RM14, ALONE, NEW PATIENT REFERRAL FROM [facility] Gussie Sprang MD [phone]/headaches and Chiari 1 malformation. DAILY MIGRAINES AND TRIGGERS unidentifiable. The headaches/migraines having varying intensity. Pt aslso mentioned neck pain.     ASSESSMENT AND PLAN  Selena Howard is a 48 y.o. female   Chronic migraine  Worsening daily headache, stemming from upper cervical region,  MRI of the cervical in June 2025 showed mild multilevel degenerative changes, no significant canal foraminal narrowing, incidental noted mild Arnold-Chiari malformation, no evidence of compression  MRI of the brain with without contrast to rule out structural abnormality  Aimovig 70 mg monthly as headache prevention  Imitrex as needed  She is already under pain management and rheumatologist, will continue follow-up with them   DIAGNOSTIC DATA (LABS, IMAGING, TESTING) - I reviewed patient records, labs, notes, testing and imaging myself where available.   MEDICAL HISTORY:  Selena Howard is a 48 year old female, seen in request by her primary care from [facility] Dr. Jolinda, Norene for evaluation of chronic migraine  History is obtained from the patient and review of electronic medical records. I personally reviewed pertinent available imaging films in PACS.   PMHx of  Pain management for chronic neck, low back pain Fibromyalgia Hypothyroidism  She was last chronic neck pain, low back pain, having migraine headache since her late 51s, gradually getting worse, especially since 2024, having migraine 2-4 times a week, in recent couple months, she is having almost daily headache,  Complaints woke up with pressure headache behind her eyes, septal area pressure pain, light, noise sensitivity,   Trigger for her migraines are sleep deprivation, weather changes,    I reviewed evaluation by rheumatologist Dr. Dolphus most recent  January 2024, primary osteoarthritis of both hands, both knees, feet, plantar fasciitis bilaterally,  Laboratory evaluation in 2025 normal BMP vitamin D  TSH, iron panel, ESR was mildly elevated at 40, C-reactive protein 33 rheumatoid factor was negative, normal CPK,   EMG nerve conduction study March 2023 was within normal limit, no evidence of peripheral neuropathy, or upper extremity focal neuropathy   PHYSICAL EXAM:   Vitals:   10/30/23 1100  BP: 123/76  Pulse: 83  Resp: 17  SpO2: 98%  Weight: 192 lb 8 oz (87.3 kg)  Height: 5' 3 (1.6 m)   Not recorded     Body mass index is 34.1 kg/m.  PHYSICAL EXAMNIATION:  Gen: NAD, conversant, well nourised, well groomed                     Cardiovascular: Regular rate rhythm, no peripheral edema, warm, nontender. Eyes: Conjunctivae clear without exudates or hemorrhage Neck: Supple, no carotid bruits. Pulmonary: Clear to auscultation bilaterally   NEUROLOGICAL EXAM:  MENTAL STATUS: Speech/cognition: Anxious looking middle-age female, awake, alert, oriented to history taking and casual conversation CRANIAL NERVES: CN II: Visual fields are full to confrontation. Pupils are round equal and briskly reactive to light. CN III, IV, VI: extraocular movement are normal. No ptosis. CN V: Facial sensation is intact to light touch CN VII: Face is symmetric with normal eye closure  CN VIII: Hearing is normal to causal conversation. CN IX, X: Phonation is normal. CN XI: Head turning and shoulder shrug are intact  MOTOR: There is no pronator drift of out-stretched arms. Muscle bulk and tone are normal. Muscle strength is normal.  REFLEXES: Reflexes are 2+ and symmetric at the biceps, triceps, knees, and  ankles. Plantar responses are flexor.  SENSORY: Intact to light touch, pinprick and vibratory sensation are intact in fingers and toes.  COORDINATION: There is no trunk or limb dysmetria noted.  GAIT/STANCE: Push-up,  steady  REVIEW OF SYSTEMS:  Full 14 system review of systems performed and notable only for as above All other review of systems were negative.   ALLERGIES: Allergies  Allergen Reactions   Cephalexin Nausea And Vomiting    Other Reaction(s): Not available  cephalexin    HOME MEDICATIONS: Current Outpatient Medications  Medication Sig Dispense Refill   betamethasone  valerate (VALISONE ) 0.1 % cream Apply 1 Application topically daily as needed (foor rash).     calcium carbonate (OS-CAL) 600 MG tablet Take 600 mg by mouth 2 (two) times daily with a meal.     Cholecalciferol (VITAMIN D3) 50 MCG (2000 UT) CHEW Chew 2,000 Pieces of gum by mouth daily.     DULoxetine  (CYMBALTA ) 30 MG capsule Take 1 capsule (30 mg total) by mouth daily. Take with 60mg  to equal 90mg  per day 90 capsule 3   DULoxetine  (CYMBALTA ) 60 MG capsule Take 1 capsule (60 mg total) by mouth daily. 90 capsule 3   esomeprazole  (NEXIUM ) 40 MG capsule Take 1 capsule by mouth once daily 90 capsule 1   FIBER PO Take by mouth.     fluticasone  (FLONASE ) 50 MCG/ACT nasal spray Place 2 sprays into both nostrils daily. 16 g 2   HYDROcodone-acetaminophen  (NORCO/VICODIN) 5-325 MG tablet Take 1 tablet by mouth every 6 (six) hours as needed for severe pain (pain score 7-10) or moderate pain (pain score 4-6).     levothyroxine  (SYNTHROID ) 25 MCG tablet Take 1 tablet by mouth once daily 90 tablet 3   montelukast  (SINGULAIR ) 10 MG tablet TAKE 1 TABLET BY MOUTH AT BEDTIME 90 tablet 1   Pediatric Multivit-Minerals (FLINTSTONES COMPLETE PO) Take by mouth.     tretinoin  (RETIN-A ) 0.025 % cream Apply topically at bedtime. 45 g 0   triamcinolone  cream (KENALOG ) 0.1 % Apply topically 2 (two) times daily as needed (rash for 10 days max). 30 g 0   No current facility-administered medications for this visit.    PAST MEDICAL HISTORY: Past Medical History:  Diagnosis Date   Anxiety    Arthritis    Endometriosis    now s/p partial  hysterectomy 06/2020   GERD (gastroesophageal reflux disease)    Osteoarthritis    Spinal stenosis at L4-L5 level     PAST SURGICAL HISTORY: Past Surgical History:  Procedure Laterality Date   ABDOMINAL HYSTERECTOMY     partial, ovary sparing   CESAREAN SECTION     COLONOSCOPY WITH PROPOFOL  N/A 08/17/2021   Procedure: COLONOSCOPY WITH PROPOFOL ;  Surgeon: Eartha Angelia Sieving, MD;  [facility]: [facility];  Service: Gastroenterology;  Laterality: N/A;  915   HEMOSTASIS CLIP PLACEMENT  08/17/2021   Procedure: HEMOSTASIS CLIP PLACEMENT;  Surgeon: Eartha Angelia Sieving, MD;  Location: [facility];  Service: Gastroenterology;;   POLYPECTOMY  08/17/2021   Procedure: POLYPECTOMY;  Surgeon: Eartha Angelia Sieving, MD;  [facility]: [facility];  Service: Gastroenterology;;   STOMACH SURGERY  07/12/2022   Gastric Sleeve placed   SUBMUCOSAL LIFTING INJECTION  08/17/2021   Procedure: SUBMUCOSAL LIFTING INJECTION;  Surgeon: Eartha Angelia Sieving, MD;  Location: [facility];  Service: Gastroenterology;;   SUBMUCOSAL TATTOO INJECTION  08/17/2021   Procedure: SUBMUCOSAL TATTOO INJECTION;  Surgeon: Eartha Angelia Sieving, MD;  [facility]: [facility];  Service: Gastroenterology;;  WISDOM TOOTH EXTRACTION Bilateral     FAMILY HISTORY: Family History  Problem Relation Age of Onset   Diabetes Mother    Liver disease Mother    COPD Father    Diabetes Father    Heart disease Maternal Grandmother    Cancer Maternal Grandfather    Cancer Paternal Grandmother    Diabetes Paternal Grandfather    Breast cancer Neg Hx     SOCIAL HISTORY: Social History   Socioeconomic History   Marital status: Divorced    Spouse name: Not on file   Number of children: 3   Years of education: 12   Highest education level: High school graduate  Occupational History   Not on file  Tobacco Use   Smoking status: Former    Current packs/day: 0.00    Average packs/day: 0.5 packs/day for  10.0 years (5.0 ttl pk-yrs)    Types: Cigarettes    Start date: 06/03/2010    Quit date: 06/02/2020    Years since quitting: 3.4    Passive exposure: Past   Smokeless tobacco: Never  Vaping Use   Vaping status: Never Used  Substance and Sexual Activity   Alcohol use: Not Currently   Drug use: Not Currently   Sexual activity: Not Currently    Birth control/protection: Surgical  Other Topics Concern   Not on file  Social History Narrative   Right handed   Caffeine use: no tea, Coffee-tries to drink decaf, diet sodas   Mother also sees me, [place]   Social Drivers of Health   Financial Resource Strain: Low Risk  (09/20/2023)   Received from [facility]   Overall Financial Resource Strain (CARDIA)    How hard is it for you to pay for the very basics like food, housing, medical care, and heating?: Not hard at all  Food Insecurity: No Food Insecurity (09/20/2023)   Received from [facility]   Hunger Vital Sign    Within the past 12 months, you worried that your food would run out before you got the money to buy more.: Never true    Within the past 12 months, the food you bought just didn't last and you didn't have money to get more.: Never true  Transportation Needs: No Transportation Needs (09/20/2023)   Received from [facility] - Transportation    In the past 12 months, has lack of transportation kept you from medical appointments or from getting medications?: No    In the past 12 months, has lack of transportation kept you from meetings, work, or from getting things needed for daily living?: No  Physical Activity: Not on file  Stress: No Stress Concern Present (04/16/2022)   Received from [facility] of Occupational Health - Occupational Stress Questionnaire    Feeling of Stress : Not at all  Social Connections: Unknown (03/01/2022)   Received from [facility]   Social Network    Social Network: Not on file  Intimate Partner Violence:  Not At Risk (04/16/2022)   Received from [facility]   HITS    Over the last 12 months how often did your partner physically hurt you?: Never    Over the last 12 months how often did your partner insult you or talk down to you?: Never    Over the last 12 months how often did your partner threaten you with physical harm?: Never    Over the last 12 months how often did your  partner scream or curse at you?: Never      Modena Callander, M.D. Ph.D.  [facility] [address] [place], [place] [address] Ph: [phone] Fax: [phone]  CC:  Burnetta Aures, MD [address] [place],  [place] [address]  Jolinda Norene HERO, DO

## 2023-10-31 ENCOUNTER — Telehealth: Payer: Self-pay | Admitting: Neurology

## 2023-10-31 NOTE — Telephone Encounter (Signed)
 MRI is scheduled on 11/11 at [facility]. Healthy blue shara: [phone] exp. 10/31/23-01/28/24

## 2023-11-05 ENCOUNTER — Telehealth: Payer: Self-pay | Admitting: Neurology

## 2023-11-05 NOTE — Telephone Encounter (Signed)
 Pt called stating that Pharmacy informed that Pt need PA for medication Erenumab-aooe (AIMOVIG) 70 MG/ML SOAJ

## 2023-11-06 ENCOUNTER — Other Ambulatory Visit (HOSPITAL_COMMUNITY): Payer: Self-pay

## 2023-11-06 ENCOUNTER — Telehealth: Payer: Self-pay

## 2023-11-06 NOTE — Telephone Encounter (Signed)
 Pharmacy Patient Advocate Encounter  Received notification from [employer] that Prior Authorization for Aimovig 70mg /ml soaj has been APPROVED from 11/06/2023 to 02/04/2024. Ran test claim, Copay is $4.00. This test claim was processed through [facility]- copay amounts may vary at other pharmacies due to pharmacy/plan contracts, or as the patient moves through the different stages of their insurance plan.   PA #/Case ID/Reference #: [id number]

## 2023-11-06 NOTE — Telephone Encounter (Signed)
 See updated office note:   Has tried and failed antidepression, Cymbalta , Lyrica , NSAIDs Prozac, within  past few years

## 2023-11-06 NOTE — Telephone Encounter (Signed)
 Hello Prescriber!  We are in the process of submitting a prior authorization for your patient for Aimovig. We are reaching out for clinical guidance for the following information to complete the request: The patient's plan requires documentation showing that patient to has tried and failed, or have contraindication to the following agents before they will approve our request: SEE BELOW. Or please advise if you would like to switch prescribed medication per insurance requirement.   Thank you! Pharmacy Team

## 2023-11-13 ENCOUNTER — Ambulatory Visit: Payer: Self-pay | Admitting: Neurology

## 2023-11-13 ENCOUNTER — Ambulatory Visit
Admission: RE | Admit: 2023-11-13 | Discharge: 2023-11-13 | Disposition: A | Discharge status: Home / Self Care | Source: Ambulatory Visit | Attending: Neurology | Admitting: Neurology

## 2023-11-13 ENCOUNTER — Other Ambulatory Visit: Payer: Self-pay | Admitting: Neurology

## 2023-11-13 DIAGNOSIS — G43709 Chronic migraine without aura, not intractable, without status migrainosus: Secondary | ICD-10-CM

## 2023-11-13 DIAGNOSIS — R519 Headache, unspecified: Secondary | ICD-10-CM | POA: Diagnosis not present

## 2023-11-13 MED ORDER — GADOPICLENOL 0.5 MMOL/ML IV SOLN: 9.0000 mL | Freq: Once | INTRAVENOUS | Status: DC | PRN

## 2023-11-14 NOTE — Progress Notes (Signed)
 Office Visit Note  Patient: Selena Howard             Date of Birth: [birth year]           MRN: [id number]             PCP: Jolinda Norene HERO, DO Referring: Jolinda Norene HERO, DO Visit Date: 11/28/2023 Occupation: Data Unavailable  Subjective:  Pain in multiple joints  History of Present Illness: Selena Howard is a 48 y.o. female with fibromyalgia and osteoarthritis.  She returns today after her last visit in January 2025.  At that visit she had bilateral trapezius injections which did not help her patient.  She continues to have neck and lower back discomfort.  She also reports discomfort in her hands, both knees and feet.  She had cervical spine MRI on June 26, 2023.  Which showed multilevel disc disease and facet joint arthropathy.  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    Activities of Daily Living:  Patient reports morning stiffness for 30-45 minutes.   Patient Reports nocturnal pain.  Difficulty dressing/grooming: Denies Difficulty climbing stairs: Reports Difficulty getting out of chair: Reports Difficulty using hands for taps, buttons, cutlery, and/or writing: Denies  Review of Systems  Constitutional:  Positive for fatigue.  HENT:  Negative for mouth sores and mouth dryness.   Eyes:  Positive for dryness.  Respiratory:  Negative for shortness of breath.   Cardiovascular:  Positive for palpitations. Negative for chest pain.  Gastrointestinal:  Positive for constipation. Negative for blood in stool and diarrhea.  Endocrine: Negative for increased urination.  Genitourinary:  Negative for involuntary urination.  Musculoskeletal:  Positive for joint pain, gait problem, joint pain, myalgias, morning stiffness, muscle tenderness and myalgias. Negative for joint swelling and muscle weakness.  Skin:  Positive for color change, hair loss and sensitivity to sunlight. Negative for rash.   Allergic/Immunologic: Positive for susceptible to infections.  Neurological:  Positive for headaches. Negative for dizziness.  Hematological:  Negative for swollen glands.  Psychiatric/Behavioral:  Positive for sleep disturbance. Negative for depressed mood. The patient is not nervous/anxious.     PMFS History:  Patient Active Problem List   Diagnosis Date Noted   Fibromyalgia 11/19/2023   History of partial hysterectomy 11/19/2023   Chronic daily headache 10/30/2023   Chronic migraine w/o aura w/o status migrainosus, not intractable 10/30/2023   Subclinical hypothyroidism 09/05/2022   History of sleeve gastrectomy 08/24/2022   Chronic narcotic dependence (HCC) 03/28/2022   Former smoker 03/28/2022   Heart palpitations 03/28/2022   Osteoarthritis 12/30/2021   Pain of lumbar spine 06/28/2021   Irritable bowel syndrome 06/27/2021   Family history of colonic polyps 06/27/2021   ESR raised 05/17/2021   Restless legs syndrome 03/31/2021   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   Vivid dream 03/31/2021   Snoring 03/31/2021   Chronic fatigue 03/31/2021   Excessive daytime sleepiness 03/31/2021   Gastroesophageal reflux disease 03/31/2021   Neck pain 03/31/2021   Body aches 03/30/2021   Paresthesia 02/04/2021   Obstructive sleep apnea syndrome 02/04/2021   Vitamin D  deficiency 01/28/2021   History of anxiety 01/28/2021   Lumbar spondylosis 12/16/2020    Past Medical History:  Diagnosis Date   Anxiety    Arthritis    Chiari malformation type I (HCC) 09/19/2023   Endometriosis    now s/p partial hysterectomy 06/2020   GERD (gastroesophageal reflux disease)  Osteoarthritis    Spinal stenosis at L4-L5 level     Family History  Problem Relation Age of Onset   Diabetes Mother    Liver disease Mother    COPD Father    Diabetes Father    Heart disease Maternal Grandmother    Cancer Maternal Grandfather    Cancer Paternal Grandmother    Diabetes Paternal Grandfather     Breast cancer Neg Hx    Past Surgical History:  Procedure Laterality Date   ABDOMINAL HYSTERECTOMY     partial, ovary sparing   CESAREAN SECTION     COLONOSCOPY WITH PROPOFOL  N/A 08/17/2021   Procedure: COLONOSCOPY WITH PROPOFOL ;  Surgeon: Selena Angelia Sieving, MD;  Location: [facility];  Service: Gastroenterology;  Laterality: N/A;  915   HEMOSTASIS CLIP PLACEMENT  08/17/2021   Procedure: HEMOSTASIS CLIP PLACEMENT;  Surgeon: Selena Angelia Sieving, MD;  Location: [facility];  Service: Gastroenterology;;   LAPAROSCOPIC GASTRIC SLEEVE RESECTION  07/12/2022   POLYPECTOMY  08/17/2021   Procedure: POLYPECTOMY;  Surgeon: Selena Angelia Sieving, MD;  Location: [facility];  Service: Gastroenterology;;   SUBMUCOSAL LIFTING INJECTION  08/17/2021   Procedure: SUBMUCOSAL LIFTING INJECTION;  Surgeon: Selena Angelia Sieving, MD;  Location: [facility];  Service: Gastroenterology;;   SUBMUCOSAL TATTOO INJECTION  08/17/2021   Procedure: SUBMUCOSAL TATTOO INJECTION;  Surgeon: Selena Angelia, Sieving, MD;  Location: [facility];  Service: Gastroenterology;;   WISDOM TOOTH EXTRACTION Bilateral    Social History   Tobacco Use   Smoking status: Former    Current packs/day: 0.00    Average packs/day: 0.5 packs/day for 10.0 years (5.0 ttl pk-yrs)    Types: Cigarettes    Start date: 06/03/2010    Quit date: 06/02/2020    Years since quitting: 3.4    Passive exposure: Past   Smokeless tobacco: Never  Vaping Use   Vaping status: Never Used  Substance Use Topics   Alcohol use: Not Currently   Drug use: Not Currently   Social History   Social History Narrative   Right handed   Caffeine use: no tea, Coffee-tries to drink decaf, diet sodas   Mother also sees me, Selena Howard      There is no immunization history on file for this patient.   Objective: Vital Signs: BP 134/86   Pulse 71   Temp (!) 97.3 F (36.3 C)   Resp 14   Ht 5' 3 (1.6 m)   Wt 194 lb 3.2 oz  (88.1 kg)   BMI 34.40 kg/m    Physical Exam Vitals and nursing note reviewed.  Constitutional:      Appearance: She is well-developed.  HENT:     Head: Normocephalic and atraumatic.  Eyes:     Conjunctiva/sclera: Conjunctivae normal.  Cardiovascular:     Rate and Rhythm: Normal rate and regular rhythm.     Heart sounds: Normal heart sounds.  Pulmonary:     Effort: Pulmonary effort is normal.     Breath sounds: Normal breath sounds.  Abdominal:     General: Bowel sounds are normal.     Palpations: Abdomen is soft.  Musculoskeletal:     Cervical back: Normal range of motion.  Lymphadenopathy:     Cervical: No cervical adenopathy.  Skin:    General: Skin is warm and dry.     Capillary Refill: Capillary refill takes less than 2 seconds.  Neurological:     Mental Status: She is alert and oriented to person,  place, and time.  Psychiatric:        Behavior: Behavior normal.      Musculoskeletal Exam: She had good range of motion of the cervical spine with discomfort.  Bilateral trapezius spasm was noted.  There was no tenderness over thoracic or lumbar spine.  Shoulders, elbows, wrist joints with good range of motion.  Bilateral PIP and DIP thickening with no synovitis was noted.  Hip joints and knee joints in good range of motion.  No warmth swelling or effusion was noted.  There was no tenderness over ankles.  Bilateral first MTP thickening was noted.  CDAI Exam: CDAI Score: -- Patient Global: --; Provider Global: -- Swollen: --; Tender: -- Joint Exam 11/28/2023   No joint exam has been documented for this visit   There is currently no information documented on the homunculus. Go to the Rheumatology activity and complete the homunculus joint exam.  Investigation: No additional findings.  Imaging: MR BRAIN WO CONTRAST Result Date: 11/13/2023  [facility] [address] [place], [place] [address] [phone] NEUROIMAGING REPORT STUDY DATE:  11/13/2023 PATIENT NAME: Selena Howard DOB: 1975/12/04 MRN: [id number] ORDERING CLINICIAN: Dr. Onita CLINICAL HISTORY: 48 year old patient being evaluated for refractory headache COMPARISON FILMS: None EXAM: MRI brain with  without contrast TECHNIQUE: Sagittal T1, axial T1, T2, FLAIR, DWI, ADC map, T2 star, coronal T2 images were obtained through the brain CONTRAST: None IMAGING SITE: [facility] imaging FINDINGS: The brain parenchyma shows few scattered supratentorial bilateral periventricular and subcortical nonspecific T2/FLAIR punctate white matter hyperintensities which may be seen in a variety of conditions including migraine headaches, remote trauma, mild vessel disease, vasculitis, demyelinating, autoimmune or infectious conditions.  None of these involve the corpus callosum, brainstem or cerebellum.  Diffusion weighted imaging is negative for acute ischemia.  T2 star images do not show significant microhemorrhages.  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  Cortical sulci and guyri show normal appearance.  Extra-axial brain structures appear normal.  Calvarium shows no abnormalities. Orbits appear normal. Paranasal sinuses show mild chronic inflammatory changes. The pituitary gland is atrophied with enlarged sell noted. The cerebellar tonsils are beaked in apperance and 4-5 mm below foramen magnum but without significant crowding of CV junction. The visualized upper cervical spine show minor disc degenerative changes.    MRI scan of the brain without contrast shows nonspecific white matter supratentorial hyperintensities with differential discussed above. Incidental findings of empty sella, small cavum septum pellucidum and low lying cerebellar tonsils are also noted. INTERPRETING PHYSICIAN: EATHER POPP, MD Certified in  Neuroimaging by American Society of Neuroimaging and [employer]  [employer]ities   Recent Labs: Lab Results  Component Value Date   WBC 5.9 02/06/2023   HGB 14.2 02/06/2023   PLT 243 02/06/2023   NA 139 11/19/2023   K 5.4 (H) 11/19/2023   CL 101 11/19/2023   CO2 26 11/19/2023   GLUCOSE 86 11/19/2023   BUN 26 (H) 11/19/2023   CREATININE 0.70 11/19/2023   BILITOT 0.4 11/19/2023   ALKPHOS 82 11/19/2023   AST 16 11/19/2023   ALT 12 11/19/2023   PROT 6.0 11/19/2023   ALBUMIN 4.0 11/19/2023   CALCIUM 9.2 11/19/2023    Speciality Comments: Discussed viscosupplement injections at the next visit if she has an adequate response to cortisone injection, exercise, physical therapy and NSAID use.  Procedures:  No procedures performed Allergies:  Cephalexin   Assessment / Plan:     Visit Diagnoses: Primary osteoarthritis of both hands -she continues to have pain and stiffness in her bilateral hands.  Bilateral PIP and DIP thickening was noted.  No synovitis was noted.  X-rays of both hands were consistent with early osteoarthritis 01/28/2021. ultrasound of the hands was performed on 09/29/2021 which was negative for synovitis.  Joint protection muscle strengthening was discussed.  Bilateral hand numbness - Patient was evaluated by Dr. Onita.  Nerve conduction velocity with EMG results were reviewed from March 30, 2021 which were within normal limits.  Primary osteoarthritis of both knees -she complains of ongoing discomfort in her knee joints especially her left knee joint.  X-rays of both knees were consistent with moderate osteoarthritis and moderate chondromalacia patella on 01/28/2021.  X-rays were reviewed today with the patient.  She has symptoms related to chondromalacia patella.  Lower extremity muscle strengthening exercises were emphasized.  Chronic pain of both knees  Primary osteoarthritis of both feet-she complains of discomfort in her bilateral first MTP joint.  Bilateral first hallux rigidus was noted.  Proper fitting  shoes were advised.  Muscle strength exercises were discussed.  Plantar fasciitis, bilateral -not symptomatic.  Previously under the care of Dr. Janit. Right plantar fascia injection in the past.   DDD (degenerative disc disease), cervical-she continues to have discomfort in her cervical spine.  She had recent MRI of the cervical spine on June 26, 2023 which was reviewed with the patient.  It showed degenerative changes and foraminal stenosis.  Patient states she has been scheduled for physical therapy.  Degeneration of intervertebral disc of lumbar region without discogenic back pain or lower extremity pain -she has intermittent pain.  Under the care of neurosurgeon.  Patient has tried PT.  Fibromyalgia-she continues to have generalized pain and discomfort from fibromyalgia.  She is on Cymbalta  and Lyrica .  Need for regular exercise, water aerobics and setting were discussed.  Trapezius muscle spasm-she continues to have some trapezius spasm.  She states she had no response to previous trigger point injections.  Other insomnia-sleep hygiene was discussed.  Other fatigue-related to insomnia and fibromyalgia.  Other medical problems listed as follows:  Vitamin D  deficiency-vitamin D  was normal on February 06, 2023.  History of gastroesophageal reflux (GERD)  Onychomycosis  History of anxiety  Former smoker  Family history of fibromyalgia  Orders: No orders of the defined types were placed in this encounter.  No orders of the defined types were placed in this encounter.    Follow-Up Instructions: Return in about 6 months (around 05/27/2024) for Osteoarthritis.   Maya Nash, MD  Note - This record has been created using Animal nutritionist.  Chart creation errors have been sought, but may not always  have been located. Such creation errors do not reflect on  the standard of medical care.

## 2023-11-19 ENCOUNTER — Encounter: Payer: Self-pay | Admitting: Family Medicine

## 2023-11-19 ENCOUNTER — Ambulatory Visit: Payer: Self-pay | Admitting: Family Medicine

## 2023-11-19 VITALS — BP 106/70 | HR 81 | Temp 97.9°F | Ht 63.0 in | Wt 191.5 lb

## 2023-11-19 DIAGNOSIS — L989 Disorder of the skin and subcutaneous tissue, unspecified: Secondary | ICD-10-CM

## 2023-11-19 DIAGNOSIS — E038 Other specified hypothyroidism: Secondary | ICD-10-CM

## 2023-11-19 DIAGNOSIS — G4733 Obstructive sleep apnea (adult) (pediatric): Secondary | ICD-10-CM

## 2023-11-19 DIAGNOSIS — Z789 Other specified health status: Secondary | ICD-10-CM

## 2023-11-19 DIAGNOSIS — M5412 Radiculopathy, cervical region: Secondary | ICD-10-CM

## 2023-11-19 DIAGNOSIS — Z903 Acquired absence of stomach [part of]: Secondary | ICD-10-CM

## 2023-11-19 DIAGNOSIS — Z0000 Encounter for general adult medical examination without abnormal findings: Secondary | ICD-10-CM

## 2023-11-19 DIAGNOSIS — Z90711 Acquired absence of uterus with remaining cervical stump: Secondary | ICD-10-CM

## 2023-11-19 DIAGNOSIS — Z0001 Encounter for general adult medical examination with abnormal findings: Secondary | ICD-10-CM | POA: Diagnosis not present

## 2023-11-19 DIAGNOSIS — M797 Fibromyalgia: Secondary | ICD-10-CM | POA: Diagnosis not present

## 2023-11-19 DIAGNOSIS — E559 Vitamin D deficiency, unspecified: Secondary | ICD-10-CM

## 2023-11-19 MED ORDER — PREGABALIN 75 MG PO CAPS: ORAL_CAPSULE | ORAL | 0 refills | Status: AC

## 2023-11-19 NOTE — Patient Instructions (Signed)
Controlled Substance Guidelines:  1. You cannot get an early refill, even it is lost.  2. You cannot get controlled medications from any other doctor, unless it is the emergency department and related to a new problem or injury.  3. You cannot use alcohol, marijuana, cocaine or any other recreational drugs while using this medication. This is very dangerous.  4. You are willing to have your urine drug tested at each visit.  5. You will not drive while using this medication, because that can put yourself and others in serious danger of an accident. 6. If any medication is stolen, then there must be a police report to verify it, or it cannot be refilled.  7. I will not prescribe these medications for longer than 3 months.  8. You must bring your pill bottle to each visit.  9. You must use the same pharmacy for all refills for the medication, unless you clear it with me beforehand.  10. You cannot share or sell this medication.   

## 2023-11-19 NOTE — Progress Notes (Signed)
 Selena Howard is a 48 y.o. female presents to office today for annual physical exam examination.    She saw her bariatric specialist earlier this year and had labs done which demonstrated no vitamin deficiencies.  She feels like she stalled out on her weight and she admits that she is not necessarily eating like they want her to.  She is hydrating well.    Her biggest concern today is that she continues to have upper back pain that goes across the entire upper back.  She also continues to have cervical spine pain.  She is under the care of of both orthopedics and a rheumatologist.  She is compliant with Cymbalta  for fibromyalgia.  Previously she was gabapentin  but was having weight gain and swelling so she self titrated off medicine.  It also caused her to be excessively sedated at times.  She has Norco as needed that is prescribed by her specialist.  Would like to try something else to help with the fibromyalgia  Also reports a scalp lesion that has been present for well but seems to be getting bigger and is now getting irritated when she is brushing her hair.  Was seeing a dermatologist in [place] would like to reach locate to [facility] Maintenance Due  Topic Date Due   Hepatitis B Vaccines 19-59 Average Risk (1 of 3 - 19+ 3-dose series) Never done   COVID-19 Vaccine (1 - 2025-26 season) Never done     There is no immunization history on file for this patient. Past Medical History:  Diagnosis Date   Anxiety    Arthritis    Chiari malformation type I (HCC) 09/19/2023   Endometriosis    now s/p partial hysterectomy 06/2020   GERD (gastroesophageal reflux disease)    Osteoarthritis    Spinal stenosis at L4-L5 level    Social History   Socioeconomic History   Marital status: Divorced    Spouse name: Not on file   Number of children: 3   Years of education: 12   Highest education level: High school graduate  Occupational History   Not on file  Tobacco Use    Smoking status: Former    Current packs/day: 0.00    Average packs/day: 0.5 packs/day for 10.0 years (5.0 ttl pk-yrs)    Types: Cigarettes    Start date: 06/03/2010    Quit date: 06/02/2020    Years since quitting: 3.4    Passive exposure: Past   Smokeless tobacco: Never  Vaping Use   Vaping status: Never Used  Substance and Sexual Activity   Alcohol use: Not Currently   Drug use: Not Currently   Sexual activity: Not Currently    Birth control/protection: Surgical  Other Topics Concern   Not on file  Social History Narrative   Right handed   Caffeine use: no tea, Coffee-tries to drink decaf, diet sodas   Mother also sees me, Arland Ngo   Social Drivers of Health   Financial Resource Strain: Low Risk  (09/20/2023)   Received from [facility]   Overall Financial Resource Strain (CARDIA)    How hard is it for you to pay for the very basics like food, housing, medical care, and heating?: Not hard at all  Food Insecurity: No Food Insecurity (09/20/2023)   Received from [facility]   Hunger Vital Sign    Within the past 12 months, you worried that your food would run out before you got the money to buy more.:  Never true    Within the past 12 months, the food you bought just didn't last and you didn't have money to get more.: Never true  Transportation Needs: No Transportation Needs (09/20/2023)   Received from [facility] - Transportation    In the past 12 months, has lack of transportation kept you from medical appointments or from getting medications?: No    In the past 12 months, has lack of transportation kept you from meetings, work, or from getting things needed for daily living?: No  Physical Activity: Not on file  Stress: No Stress Concern Present (04/16/2022)   Received from [facility] - Occupational Stress Questionnaire    Feeling of Stress : Not at all  Social Connections: Not on file  Intimate Partner  Violence: Not At Risk (04/16/2022)   Received from [facility]   HITS    Over the last 12 months how often did your partner physically hurt you?: Never    Over the last 12 months how often did your partner insult you or talk down to you?: Never    Over the last 12 months how often did your partner threaten you with physical harm?: Never    Over the last 12 months how often did your partner scream or curse at you?: Never   Past Surgical History:  Procedure Laterality Date   ABDOMINAL HYSTERECTOMY     partial, ovary sparing   CESAREAN SECTION     COLONOSCOPY WITH PROPOFOL  N/A 08/17/2021   Procedure: COLONOSCOPY WITH PROPOFOL ;  Surgeon: Eartha Angelia Sieving, MD;  [facility];  Service: Gastroenterology;  Laterality: N/A;  915   HEMOSTASIS CLIP PLACEMENT  08/17/2021   Procedure: HEMOSTASIS CLIP PLACEMENT;  Surgeon: Eartha Angelia Sieving, MD;  [facility];  Service: Gastroenterology;;   POLYPECTOMY  08/17/2021   Procedure: POLYPECTOMY;  Surgeon: Eartha Angelia Sieving, MD;  [facility];  Service: Gastroenterology;;   STOMACH SURGERY  07/12/2022   Gastric Sleeve placed   SUBMUCOSAL LIFTING INJECTION  08/17/2021   Procedure: SUBMUCOSAL LIFTING INJECTION;  Surgeon: Eartha Angelia Sieving, MD;  [facility];  Service: Gastroenterology;;   SUBMUCOSAL TATTOO INJECTION  08/17/2021   Procedure: SUBMUCOSAL TATTOO INJECTION;  Surgeon: Eartha Angelia Sieving, MD;  [facility];  Service: Gastroenterology;;   WISDOM TOOTH EXTRACTION Bilateral    Family History  Problem Relation Age of Onset   Diabetes Mother    Liver disease Mother    COPD Father    Diabetes Father    Heart disease Maternal Grandmother    Cancer Maternal Grandfather    Cancer Paternal Grandmother    Diabetes Paternal Grandfather    Breast cancer Neg Hx     Current Outpatient Medications:    betamethasone  valerate (VALISONE ) 0.1 % cream, Apply 1  Application topically daily as needed (foor rash)., Disp: , Rfl:    calcium carbonate (OS-CAL) 600 MG tablet, Take 600 mg by mouth 2 (two) times daily with a meal., Disp: , Rfl:    Cholecalciferol (VITAMIN D3) 50 MCG (2000 UT) CHEW, Chew 2,000 Pieces of gum by mouth daily., Disp: , Rfl:    DULoxetine  (CYMBALTA ) 30 MG capsule, Take 1 capsule (30 mg total) by mouth daily. Take with 60mg  to equal 90mg  per day, Disp: 90 capsule, Rfl: 3   DULoxetine  (CYMBALTA ) 60 MG capsule, Take 1 capsule (60 mg total) by mouth daily., Disp: 90 capsule, Rfl:  3   Erenumab-aooe (AIMOVIG) 70 MG/ML SOAJ, Inject 70 mg into the skin every 30 (thirty) days., Disp: 1.12 mL, Rfl: 11   esomeprazole  (NEXIUM ) 40 MG capsule, Take 1 capsule by mouth once daily, Disp: 90 capsule, Rfl: 1   FIBER PO, Take by mouth., Disp: , Rfl:    fluticasone  (FLONASE ) 50 MCG/ACT nasal spray, Place 2 sprays into both nostrils daily., Disp: 16 g, Rfl: 2   HYDROcodone-acetaminophen  (NORCO/VICODIN) 5-325 MG tablet, Take 1 tablet by mouth every 6 (six) hours as needed for severe pain (pain score 7-10) or moderate pain (pain score 4-6)., Disp: , Rfl:    levothyroxine  (SYNTHROID ) 25 MCG tablet, Take 1 tablet by mouth once daily, Disp: 90 tablet, Rfl: 3   montelukast  (SINGULAIR ) 10 MG tablet, TAKE 1 TABLET BY MOUTH AT BEDTIME, Disp: 90 tablet, Rfl: 1   Pediatric Multivit-Minerals (FLINTSTONES COMPLETE PO), Take by mouth., Disp: , Rfl:    SUMAtriptan (IMITREX) 50 MG tablet, Take 1 tablet (50 mg total) by mouth every 2 (two) hours as needed. May repeat in 2 hours if headache persists or recurs., Disp: 10 tablet, Rfl: 11   tretinoin  (RETIN-A ) 0.025 % cream, Apply topically at bedtime., Disp: 45 g, Rfl: 0   triamcinolone  cream (KENALOG ) 0.1 %, Apply topically 2 (two) times daily as needed (rash for 10 days max)., Disp: 30 g, Rfl: 0  Allergies  Allergen Reactions   Cephalexin Nausea And Vomiting    Other Reaction(s): Not available  cephalexin      ROS: Review of Systems Pertinent items noted in HPI and remainder of comprehensive ROS otherwise negative.    Physical exam BP 106/70   Pulse 81   Temp 97.9 F (36.6 C)   Ht 5' 3 (1.6 m)   Wt 191 lb 8 oz (86.9 kg)   SpO2 97%   BMI 33.92 kg/m  General appearance: alert, cooperative, appears stated age, no distress, and mildly obese Head: Normocephalic, without obvious abnormality, atraumatic Eyes: negative findings: lids and lashes normal, conjunctivae and sclerae normal, corneas clear, and pupils equal, round, reactive to light and accomodation Ears: normal TM's and external ear canals both ears Nose: Nares normal. Septum midline. Mucosa normal. No drainage or sinus tenderness. Throat: lips, mucosa, and tongue normal; teeth and gums normal Neck: no adenopathy, no carotid bruit, supple, symmetrical, trachea midline, and thyroid  not enlarged, symmetric, no tenderness/mass/nodules Back: Symmetric tenderness in the thoracic spine.  No palpable midline abnormalities.  She has some flattening of the thoracic spine present.  Ambulating independently with normal gait and station Lungs: Clear to auscultation bilaterally with normal work of breathing on room air.  No wheezes, rhonchi or rails Heart: regular rate and rhythm, S1, S2 normal, no murmur, click, rub or gallop Abdomen: soft, non-tender; bowel sounds normal; no masses,  no organomegaly Extremities: extremities normal, atraumatic, no cyanosis or edema Pulses: 2+ and symmetric Skin: Flesh-colored papular lesion noted at the left anterior aspect of the scalp that is approximately lentil sized. Lymph nodes: Supraclavicular and anterior cervical lymph nodes without enlargement Neurologic: Wears glasses.  Otherwise current nerves II through XII grossly intact     11/19/2023    1:10 PM 05/29/2023   11:20 AM 02/06/2023   11:42 AM  Depression screen PHQ 2/9  Decreased Interest 1 1 0  Down, Depressed, Hopeless 0 0 0  PHQ - 2 Score 1 1 0   Altered sleeping 1 1 1   Tired, decreased energy 2 1 1   Change in appetite 0  0 0  Feeling bad or failure about yourself  0 1 0  Trouble concentrating 2 0 1  Moving slowly or fidgety/restless 0 0 0  Suicidal thoughts 0 0 0  PHQ-9 Score 6 4  3    Difficult doing work/chores Somewhat difficult Somewhat difficult Somewhat difficult     Data saved with a previous flowsheet row definition      11/19/2023    1:10 PM 05/29/2023   11:25 AM 05/29/2023   11:20 AM 02/06/2023   11:43 AM  GAD 7 : Generalized Anxiety Score  Nervous, Anxious, on Edge 0 1 0 0  Control/stop worrying 0 0 0 0  Worry too much - different things 1 1 0 0  Trouble relaxing 1 1 0 1  Restless 1 1 0 1  Easily annoyed or irritable 1 1 0 0  Afraid - awful might happen 0 0 0 0  Total GAD 7 Score 4 5 0 2  Anxiety Difficulty Somewhat difficult Somewhat difficult  Somewhat difficult    No results found for this or any previous visit (from the past 2160 hours).   Assessment/ Plan: Olam Barman here for annual physical exam.   Annual physical exam  Fibromyalgia - Plan: pregabalin  (LYRICA ) 75 MG capsule, Ambulatory referral to Physical Therapy  Cervical radiculopathy - Plan: Ambulatory referral to Physical Therapy  Lesion of skin of scalp - Plan: Ambulatory referral to Dermatology  History of sleeve gastrectomy - Plan: CMP14+EGFR, Lipid Panel, CANCELED: Anemia panel, CANCELED: VITAMIN D  25 Hydroxy (Vit-D Deficiency, Fractures)  History of partial hysterectomy - Plan: CANCELED: Cytology - PAP  Vitamin D  deficiency - Plan: CMP14+EGFR  Subclinical hypothyroidism - Plan: CMP14+EGFR, Lipid Panel, TSH + free T4  Obstructive sleep apnea syndrome - Plan: CANCELED: Anemia panel  Hepatitis B vaccination status unknown - Plan: Hepatitis B surface antibody,quantitative   Referral to physical therapy for cervical radiculopathy and fibromyalgia pains.  I am going to trial her on Lyrica .  We discussed gradually increasing to  twice daily dosing.  Will reassess in 1 month and if needing advanced dosing we can do that at that time.  Discussed sedating nature of this medication.  Will plan to complete controlled substance contract and UDS at that visit if she would like to continue medication.  The national narcotic database reviewed and there were no red flags  For her scalp lesion, it is papular in nature but since it is getting caught on her starleen I suspect they will accommodate a removal.  Referral to dermatology placed  I reviewed her labs from July so I did go ahead and cancel her vitamin testing as these were normal.  Repeat kidney function, liver enzymes and nonfasting lipid panel.  Will also check for hepatitis B immunity and if needed we can offer immunization.  She declined influenza vaccination today  Counseled on healthy lifestyle choices, including diet (rich in fruits, vegetables and lean meats and low in salt and simple carbohydrates) and exercise (at least 30 minutes of moderate physical activity daily).  Patient to follow up 70m  Kilea Mccarey M. Jolinda, DO

## 2023-11-20 ENCOUNTER — Ambulatory Visit: Payer: Self-pay | Admitting: Family Medicine

## 2023-11-20 LAB — CMP14+EGFR
ALT: 12 IU/L (ref 0–32)
AST: 16 IU/L (ref 0–40)
Albumin: 4 g/dL (ref 3.9–4.9)
Alkaline Phosphatase: 82 IU/L (ref 41–116)
BUN/Creatinine Ratio: 37 — ABNORMAL HIGH (ref 9–23)
BUN: 26 mg/dL — ABNORMAL HIGH (ref 6–24)
Bilirubin Total: 0.4 mg/dL (ref 0.0–1.2)
CO2: 26 mmol/L (ref 20–29)
Calcium: 9.2 mg/dL (ref 8.7–10.2)
Chloride: 101 mmol/L (ref 96–106)
Creatinine, Ser: 0.7 mg/dL (ref 0.57–1.00)
Globulin, Total: 2 g/dL (ref 1.5–4.5)
Glucose: 86 mg/dL (ref 70–99)
Potassium: 5.4 mmol/L — ABNORMAL HIGH (ref 3.5–5.2)
Sodium: 139 mmol/L (ref 134–144)
Total Protein: 6 g/dL (ref 6.0–8.5)
eGFR: 107 mL/min/1.73 (ref >59)

## 2023-11-20 LAB — LIPID PANEL
Chol/HDL Ratio: 2.6 ratio (ref 0.0–4.4)
Cholesterol, Total: 142 mg/dL (ref 100–199)
HDL: 55 mg/dL (ref >39)
LDL Chol Calc (NIH): 71 mg/dL (ref 0–99)
Triglycerides: 83 mg/dL (ref 0–149)
VLDL Cholesterol Cal: 16 mg/dL (ref 5–40)

## 2023-11-20 LAB — TSH+FREE T4
Free T4: 1.01 ng/dL (ref 0.82–1.77)
TSH: 1.05 u[IU]/mL (ref 0.450–4.500)

## 2023-11-20 LAB — HEPATITIS B SURFACE ANTIBODY, QUANTITATIVE: Hepatitis B Surf Ab Quant: <3.5 m[IU]/mL — ABNORMAL LOW

## 2023-11-26 ENCOUNTER — Ambulatory Visit: Admitting: Family Medicine

## 2023-11-27 ENCOUNTER — Ambulatory Visit: Admitting: Physical Therapy

## 2023-11-28 ENCOUNTER — Ambulatory Visit: Attending: Rheumatology | Admitting: Rheumatology

## 2023-11-28 ENCOUNTER — Encounter: Payer: Self-pay | Admitting: Rheumatology

## 2023-11-28 VITALS — BP 134/86 | HR 71 | Temp 97.3°F | Resp 14 | Ht 63.0 in | Wt 194.2 lb

## 2023-11-28 DIAGNOSIS — M19071 Primary osteoarthritis, right ankle and foot: Secondary | ICD-10-CM | POA: Insufficient documentation

## 2023-11-28 DIAGNOSIS — G8929 Other chronic pain: Secondary | ICD-10-CM | POA: Insufficient documentation

## 2023-11-28 DIAGNOSIS — M62838 Other muscle spasm: Secondary | ICD-10-CM | POA: Insufficient documentation

## 2023-11-28 DIAGNOSIS — Z8269 Family history of other diseases of the musculoskeletal system and connective tissue: Secondary | ICD-10-CM | POA: Insufficient documentation

## 2023-11-28 DIAGNOSIS — B351 Tinea unguium: Secondary | ICD-10-CM | POA: Insufficient documentation

## 2023-11-28 DIAGNOSIS — M19072 Primary osteoarthritis, left ankle and foot: Secondary | ICD-10-CM | POA: Insufficient documentation

## 2023-11-28 DIAGNOSIS — R5383 Other fatigue: Secondary | ICD-10-CM | POA: Insufficient documentation

## 2023-11-28 DIAGNOSIS — M25562 Pain in left knee: Secondary | ICD-10-CM | POA: Diagnosis present

## 2023-11-28 DIAGNOSIS — M19041 Primary osteoarthritis, right hand: Secondary | ICD-10-CM | POA: Diagnosis present

## 2023-11-28 DIAGNOSIS — Z87891 Personal history of nicotine dependence: Secondary | ICD-10-CM | POA: Insufficient documentation

## 2023-11-28 DIAGNOSIS — G4709 Other insomnia: Secondary | ICD-10-CM | POA: Insufficient documentation

## 2023-11-28 DIAGNOSIS — M51369 Other intervertebral disc degeneration, lumbar region without mention of lumbar back pain or lower extremity pain: Secondary | ICD-10-CM | POA: Insufficient documentation

## 2023-11-28 DIAGNOSIS — R200 Anesthesia of skin: Secondary | ICD-10-CM | POA: Insufficient documentation

## 2023-11-28 DIAGNOSIS — Z8659 Personal history of other mental and behavioral disorders: Secondary | ICD-10-CM | POA: Diagnosis present

## 2023-11-28 DIAGNOSIS — E559 Vitamin D deficiency, unspecified: Secondary | ICD-10-CM | POA: Insufficient documentation

## 2023-11-28 DIAGNOSIS — M5030 Other cervical disc degeneration, unspecified cervical region: Secondary | ICD-10-CM | POA: Insufficient documentation

## 2023-11-28 DIAGNOSIS — Z8719 Personal history of other diseases of the digestive system: Secondary | ICD-10-CM | POA: Diagnosis present

## 2023-11-28 DIAGNOSIS — M25561 Pain in right knee: Secondary | ICD-10-CM | POA: Insufficient documentation

## 2023-11-28 DIAGNOSIS — M722 Plantar fascial fibromatosis: Secondary | ICD-10-CM | POA: Diagnosis present

## 2023-11-28 DIAGNOSIS — M170 Bilateral primary osteoarthritis of knee: Secondary | ICD-10-CM | POA: Diagnosis present

## 2023-11-28 DIAGNOSIS — M797 Fibromyalgia: Secondary | ICD-10-CM | POA: Diagnosis present

## 2023-11-28 DIAGNOSIS — M19042 Primary osteoarthritis, left hand: Secondary | ICD-10-CM | POA: Diagnosis present

## 2023-11-28 DIAGNOSIS — R700 Elevated erythrocyte sedimentation rate: Secondary | ICD-10-CM

## 2023-12-04 ENCOUNTER — Other Ambulatory Visit: Payer: Self-pay

## 2023-12-04 ENCOUNTER — Ambulatory Visit: Attending: Family Medicine | Admitting: Physical Therapy

## 2023-12-04 ENCOUNTER — Other Ambulatory Visit: Payer: Self-pay | Admitting: Family Medicine

## 2023-12-04 DIAGNOSIS — M62838 Other muscle spasm: Secondary | ICD-10-CM | POA: Diagnosis present

## 2023-12-04 DIAGNOSIS — M797 Fibromyalgia: Secondary | ICD-10-CM | POA: Insufficient documentation

## 2023-12-04 DIAGNOSIS — M6281 Muscle weakness (generalized): Secondary | ICD-10-CM | POA: Insufficient documentation

## 2023-12-04 DIAGNOSIS — M542 Cervicalgia: Secondary | ICD-10-CM | POA: Diagnosis present

## 2023-12-04 DIAGNOSIS — K219 Gastro-esophageal reflux disease without esophagitis: Secondary | ICD-10-CM

## 2023-12-04 DIAGNOSIS — M5412 Radiculopathy, cervical region: Secondary | ICD-10-CM | POA: Insufficient documentation

## 2023-12-04 NOTE — Therapy (Signed)
 OUTPATIENT PHYSICAL THERAPY CERVICAL EVALUATION   Patient Name: Selena Howard MRN: [id number] DOB:1975/04/22, 48 y.o., female Today's Date: 12/04/2023  END OF SESSION:  PT End of Session - 12/04/23 1409     Visit Number 1    Number of Visits 8    Date for Recertification  01/08/24    PT Start Time 0145    PT Stop Time 0206    PT Time Calculation (min) 21 min    Activity Tolerance Patient tolerated treatment well    Behavior During Therapy [facility] for tasks assessed/performed          Past Medical History:  Diagnosis Date   Anxiety    Arthritis    Chiari malformation type I (HCC) 09/19/2023   Endometriosis    now s/p partial hysterectomy 06/2020   GERD (gastroesophageal reflux disease)    Osteoarthritis    Spinal stenosis at L4-L5 level    Past Surgical History:  Procedure Laterality Date   ABDOMINAL HYSTERECTOMY     partial, ovary sparing   CESAREAN SECTION     COLONOSCOPY WITH PROPOFOL  N/A 08/17/2021   Procedure: COLONOSCOPY WITH PROPOFOL ;  Surgeon: Eartha Angelia Sieving, MD;  [facility];  Service: Gastroenterology;  Laterality: N/A;  915   HEMOSTASIS CLIP PLACEMENT  08/17/2021   Procedure: HEMOSTASIS CLIP PLACEMENT;  Surgeon: Eartha Angelia Sieving, MD;  [facility];  Service: Gastroenterology;;   LAPAROSCOPIC GASTRIC SLEEVE RESECTION  07/12/2022   POLYPECTOMY  08/17/2021   Procedure: POLYPECTOMY;  Surgeon: Eartha Angelia Sieving, MD;  [facility];  Service: Gastroenterology;;   SUBMUCOSAL LIFTING INJECTION  08/17/2021   Procedure: SUBMUCOSAL LIFTING INJECTION;  Surgeon: Eartha Angelia Sieving, MD;  [facility];  Service: Gastroenterology;;   SUBMUCOSAL TATTOO INJECTION  08/17/2021   Procedure: SUBMUCOSAL TATTOO INJECTION;  Surgeon: Eartha Angelia Sieving, MD;  [facility];  Service: Gastroenterology;;   WISDOM TOOTH EXTRACTION Bilateral    Patient Active Problem List   Diagnosis Date  Noted   Fibromyalgia 11/19/2023   History of partial hysterectomy 11/19/2023   Chronic daily headache 10/30/2023   Chronic migraine w/o aura w/o status migrainosus, not intractable 10/30/2023   Subclinical hypothyroidism 09/05/2022   History of sleeve gastrectomy 08/24/2022   Chronic narcotic dependence (HCC) 03/28/2022   Former smoker 03/28/2022   Heart palpitations 03/28/2022   Osteoarthritis 12/30/2021   Pain of lumbar spine 06/28/2021   Irritable bowel syndrome 06/27/2021   Family history of colonic polyps 06/27/2021   ESR raised 05/17/2021   Restless legs syndrome 03/31/2021   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  THERAPY DIAG:  No diagnosis found.  Rationale for Evaluation and Treatment: Rehabilitation  ONSET DATE: Ongoing for many years and worse over the last year.    SUBJECTIVE:  SUBJECTIVE STATEMENT: The patient presents to the clinic with chronic neck pain that has been ongoing for many years and worse over the last year.  She will experience pain into her Ue's, right > left.  She rates her pain at a 7/10 today and described as ache, throbbing, numb, sore, tingling, crawling sensation, burning and shocking.    PERTINENT HISTORY:  Fibromyalgia.    PAIN:  Are you having pain? Yes: NPRS  scale: 7/10.   Pain location: Bilateral neck. Pain description: As above.   Aggravating factors: As above.   Relieving factors: As above.    PRECAUTIONS: None  RED FLAGS: None     WEIGHT BEARING RESTRICTIONS: No  FALLS:  Has patient fallen in last 6 months? No  LIVING ENVIRONMENT: Lives with: lives with their family Lives in: House/apartment Has following equipment at home: None  OCCUPATION: Not working.    PLOF: Independent  PATIENT GOALS: Reduce pain and symptoms.    OBJECTIVE:  Note: Objective measures were completed at Evaluation unless otherwise noted.  DIAGNOSTIC FINDINGS:  06/26/23: IMPRESSION: 1. Right eccentric disc osteophyte complex at C4-5 with resultant mild canal and right C5 foraminal stenosis. 2. Right eccentric disc osteophyte complex at C5-6 with resultant mild canal, with mild to moderate right worse than left C6 foraminal stenosis. 3. Broad-based right paracentral to subarticular disc protrusion with inferior migration at C6-7, resulting in mild spinal stenosis, with mild left C7 foraminal narrowing. 4. Mild Chiari 1 malformation with the cerebellar tonsils extending up to 8 mm below the foramen magnum. No syrinx.   PATIENT SURVEYS:  NDI:  34/50 (68%).    POSTURE: rounded shoulders and forward head  PALPATION: Tender to palpation over bilateral suboccipital musculature, paraspinal and UT's.     CERVICAL ROM:   Active right cervical rotation is 72 degrees and left is 65 degrees and bilateral sidebending is 20 degrees.    UPPER EXTREMITY ROM:  WNL.  UPPER EXTREMITY MMT:  Patient demonstrated normal bilateral shoulder and elbow strength.  DTR's:   Right Triceps reflex is diminished.   TREATMENT DATE:                                                                                                                                  PATIENT EDUCATION:  Education details:  Person educated:  International aid/development worker:  Education  comprehension:   HOME EXERCISE PROGRAM:   ASSESSMENT:  CLINICAL IMPRESSION: The patient presents to OPPT with c/o chronic neck pain.  She is tender to palpation over bilateral suboccipital musculature, paraspinal and UT's. Her right triceps reflex is diminished.  She exhibits a loss of cervical range of motion.  Her NDI score is 34/50 (68%).   Patient may benefit from skilled physical therapy intervention to address pain and deficits.   OBJECTIVE IMPAIRMENTS: decreased activity tolerance, decreased ROM, increased muscle spasms, and pain.   ACTIVITY LIMITATIONS: carrying and lifting  PARTICIPATION LIMITATIONS: meal prep,  cleaning, and laundry  PERSONAL FACTORS: Time since onset of injury/illness/exacerbation and 1 comorbidity: Fibromyalgia are also affecting patient's functional outcome.   REHAB POTENTIAL: Fair plus/good minus.  CLINICAL DECISION MAKING: Evolving/moderate complexity  EVALUATION COMPLEXITY: Moderate   GOALS:   LONG TERM GOALS: Target date: 01/08/24  Ind with a HEP.  Goal status: INITIAL  2.  Improve bilateral cervical rotation to 75 degrees so she can more easily turn her head while driving.    Goal status: INITIAL  3.  Improve NDI score by 5 points.  Goal status: INITIAL  4.  Perform ADL's with pain not > 4/10. Goal status: INITIAL   PLAN:  PT FREQUENCY/DURATION:  8 visits.    PLANNED INTERVENTIONS: 97110-Therapeutic exercises, 97530- Therapeutic activity, [id number]- Neuromuscular re-education, 97535- Self Care, [id number]- Manual therapy, and Patient/Family education  PLAN FOR NEXT SESSION: STW/M, postural exercises.     Maxi Rodas, PT 12/04/2023, 2:12 PM

## 2023-12-11 ENCOUNTER — Other Ambulatory Visit (HOSPITAL_COMMUNITY): Payer: Self-pay

## 2023-12-12 ENCOUNTER — Ambulatory Visit

## 2023-12-12 DIAGNOSIS — M542 Cervicalgia: Secondary | ICD-10-CM | POA: Diagnosis not present

## 2023-12-12 DIAGNOSIS — M62838 Other muscle spasm: Secondary | ICD-10-CM

## 2023-12-12 DIAGNOSIS — M5412 Radiculopathy, cervical region: Secondary | ICD-10-CM

## 2023-12-12 DIAGNOSIS — M6281 Muscle weakness (generalized): Secondary | ICD-10-CM

## 2023-12-12 NOTE — Therapy (Signed)
 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   Endometriosis    now s/p partial hysterectomy 06/2020   GERD (gastroesophageal reflux disease)    Osteoarthritis    Spinal stenosis at L4-L5 level    Past Surgical History:  Procedure Laterality Date   ABDOMINAL HYSTERECTOMY     partial, ovary sparing   CESAREAN SECTION     COLONOSCOPY WITH PROPOFOL  N/A 08/17/2021   Procedure: COLONOSCOPY WITH PROPOFOL ;  Surgeon: Eartha Angelia Sieving, MD;  [facility];  Service: Gastroenterology;  Laterality: N/A;  915   HEMOSTASIS CLIP PLACEMENT  08/17/2021   Procedure: HEMOSTASIS CLIP PLACEMENT;  Surgeon: Eartha Angelia Sieving, MD;  [facility];  Service: Gastroenterology;;   LAPAROSCOPIC GASTRIC SLEEVE RESECTION  07/12/2022   POLYPECTOMY  08/17/2021   Procedure: POLYPECTOMY;  Surgeon: Eartha Angelia Sieving, MD;  [facility];  Service: Gastroenterology;;   SUBMUCOSAL LIFTING INJECTION  08/17/2021   Procedure: SUBMUCOSAL LIFTING INJECTION;  Surgeon: Eartha Angelia Sieving, MD;  [facility];  Service: Gastroenterology;;   SUBMUCOSAL TATTOO INJECTION  08/17/2021   Procedure: SUBMUCOSAL TATTOO INJECTION;  Surgeon: Eartha Angelia Sieving, MD;  [facility];  Service: Gastroenterology;;   WISDOM TOOTH EXTRACTION Bilateral    Patient Active Problem List   Diagnosis Date Noted   Fibromyalgia 11/19/2023   History of partial  hysterectomy 11/19/2023   Chronic daily headache 10/30/2023   Chronic migraine w/o aura w/o status migrainosus, not intractable 10/30/2023   Subclinical hypothyroidism 09/05/2022   History of sleeve gastrectomy 08/24/2022   Chronic narcotic dependence (HCC) 03/28/2022   Former smoker 03/28/2022   Heart palpitations 03/28/2022   Osteoarthritis 12/30/2021   Pain of lumbar spine 06/28/2021   Irritable bowel syndrome 06/27/2021   Family history of colonic polyps 06/27/2021   ESR raised 05/17/2021   Restless legs syndrome 03/31/2021   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  THERAPY DIAG:  Cervicalgia  Other muscle spasm  Radiculopathy, cervical region  Muscle weakness (generalized)  Rationale for Evaluation and Treatment: Rehabilitation  ONSET DATE: Ongoing for many years and worse over the last year.    SUBJECTIVE:  SUBJECTIVE STATEMENT: Pt reports 6/10 neck pain and 7/10 low back pain.   PERTINENT HISTORY:  Fibromyalgia.    PAIN:  Are you having pain? Yes: NPRS scale: 6-7/10.   Pain location: Bilateral neck. Pain description: As above.   Aggravating factors: As above.   Relieving factors: As above.    PRECAUTIONS: None  RED FLAGS: None     WEIGHT BEARING RESTRICTIONS: No  FALLS:  Has  patient fallen in last 6 months? No  LIVING ENVIRONMENT: Lives with: lives with their family Lives in: House/apartment Has following equipment at home: None  OCCUPATION: Not working.    PLOF: Independent  PATIENT GOALS: Reduce pain and symptoms.    OBJECTIVE:  Note: Objective measures were completed at Evaluation unless otherwise noted.  DIAGNOSTIC FINDINGS:  06/26/23: IMPRESSION: 1. Right eccentric disc osteophyte complex at C4-5 with resultant mild canal and right C5 foraminal stenosis. 2. Right eccentric disc osteophyte complex at C5-6 with resultant mild canal, with mild to moderate right worse than left C6 foraminal stenosis. 3. Broad-based right paracentral to subarticular disc protrusion with inferior migration at C6-7, resulting in mild spinal stenosis, with mild left C7 foraminal narrowing. 4. Mild Chiari 1 malformation with the cerebellar tonsils extending up to 8 mm below the foramen magnum. No syrinx.   PATIENT SURVEYS:  NDI:  34/50 (68%).    POSTURE: rounded shoulders and forward head  PALPATION: Tender to palpation over bilateral suboccipital musculature, paraspinal and UT's.     CERVICAL ROM:   Active right cervical rotation is 72 degrees and left is 65 degrees and bilateral sidebending is 20 degrees.    UPPER EXTREMITY ROM:  WNL.  UPPER EXTREMITY MMT:  Patient demonstrated normal bilateral shoulder and elbow strength.  DTR's:   Right Triceps reflex is diminished.   TREATMENT DATE:                                                                                                                                12/12/23                                  EXERCISE LOG  Exercise Repetitions and Resistance Comments  Nustep Lvl 2 x 15 mins   Scap Retraction 20 reps    Shoulder Shrugs 20 reps   Shoulder Circles 20 reps each way   Rows Yellow x 20 reps   Extensions Yellow x 20 reps   Horizontal Abduction Yellow x 20 reps    ER Yellow x 20 reps      Blank cell = exercise not performed today   Manual Therapy Soft Tissue Mobilization: Right upper trap, STW/M to right upper trap to decrease pain and tone   PATIENT EDUCATION:  Education details:  Person educated:  International aid/development worker:  Education comprehension:   HOME EXERCISE PROGRAM:   ASSESSMENT:  CLINICAL IMPRESSION: Pt arrives for  today's treatment session reporting 6/10 neck pain and 7/10 low back pain.  Pt able to tolerate introduction to Nustep for warm up today without discomfort.  Pt introduced to seated postural exercises today with min cues required for proper technique and posture.  STW/M performed to right upper trap to decrease pain and tone.  Pt reported 5/10 neck pain at completion of today's treatment session.  Pt given hand out for personal TENS unit.   OBJECTIVE IMPAIRMENTS: decreased activity tolerance, decreased ROM, increased muscle spasms, and pain.   ACTIVITY LIMITATIONS: carrying and lifting  PARTICIPATION LIMITATIONS: meal prep, cleaning, and laundry  PERSONAL FACTORS: Time since onset of injury/illness/exacerbation and 1 comorbidity: Fibromyalgia are also affecting patient's functional outcome.   REHAB POTENTIAL: Fair plus/good minus.  CLINICAL DECISION MAKING: Evolving/moderate complexity  EVALUATION COMPLEXITY: Moderate   GOALS:   LONG TERM GOALS: Target date: 01/08/24  Ind with a HEP.  Goal status: INITIAL  2.  Improve bilateral cervical rotation to 75 degrees so she can more easily turn her head while driving.    Goal status: INITIAL  3.  Improve NDI score by 5 points.  Goal status: INITIAL  4.  Perform ADL's with pain not > 4/10. Goal status: INITIAL   PLAN:  PT FREQUENCY/DURATION:  8 visits.    PLANNED INTERVENTIONS: 97110-Therapeutic exercises, 97530- Therapeutic activity, [id number]- Neuromuscular re-education, 97535- Self Care, [id number]- Manual therapy, and Patient/Family education  PLAN FOR NEXT SESSION: STW/M, postural exercises.      Delon DELENA Gosling, PTA 12/12/2023, 11:47 AM

## 2023-12-14 ENCOUNTER — Ambulatory Visit: Admitting: Family Medicine

## 2023-12-14 ENCOUNTER — Encounter: Payer: Self-pay | Admitting: Family Medicine

## 2023-12-14 VITALS — BP 112/76 | HR 78 | Temp 97.9°F | Ht 63.0 in | Wt 194.1 lb

## 2023-12-14 DIAGNOSIS — M5412 Radiculopathy, cervical region: Secondary | ICD-10-CM | POA: Insufficient documentation

## 2023-12-14 DIAGNOSIS — Z23 Encounter for immunization: Secondary | ICD-10-CM | POA: Diagnosis not present

## 2023-12-14 DIAGNOSIS — M797 Fibromyalgia: Secondary | ICD-10-CM | POA: Diagnosis not present

## 2023-12-14 DIAGNOSIS — Z789 Other specified health status: Secondary | ICD-10-CM

## 2023-12-14 MED ORDER — METHYLPREDNISOLONE ACETATE 80 MG/ML IJ SUSP
40.0000 mg | Freq: Once | INTRAMUSCULAR | Status: AC
  Administered 2023-12-14: 40 mg via INTRAMUSCULAR

## 2023-12-14 NOTE — Progress Notes (Signed)
 Subjective: CC: Follow-up cervical radiculopathy, fibromyalgia PCP: Jolinda Norene HERO, DO YEP:Selena Howard is a 48 y.o. female presenting to clinic today for:  Patient is seen by pain management and spinal specialist.  She saw her spinal specialist Dr Laqueta recently and they are talking about doing some type of nerve block in the lumbar spine.  She does admit that her thoracic pain is still present but she is having more lumbar pain lately.  She continues to take Norco regularly.  We added Lyrica  75 mg up to twice daily last visit and she did notice a huge amount of improvement after a couple of weeks of her fibromyalgia pain but she did notice some fluid retention and weight gain so she discontinued medication.  She has started physical therapy and will complete 8 sessions.  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   Endometriosis    now s/p partial hysterectomy 06/2020   GERD (gastroesophageal reflux disease)    Osteoarthritis    Spinal stenosis at L4-L5 level    Current Medications[2] Social History   Socioeconomic History   Marital status: Divorced    Spouse name: Not on file   Number of children: 3   Years of education: 12   Highest education level: High school graduate  Occupational History   Not on file  Tobacco Use   Smoking status: Former    Current packs/day: 0.00    Average packs/day: 0.5 packs/day for 10.0 years (5.0 ttl pk-yrs)    Types: Cigarettes    Start date: 06/03/2010    Quit date: 06/02/2020    Years since quitting: 3.5    Passive exposure: Past   Smokeless tobacco: Never  Vaping Use   Vaping status: Never Used  Substance and Sexual Activity   Alcohol use: Not Currently   Drug use: Not Currently   Sexual activity: Not Currently    Birth  control/protection: Surgical  Other Topics Concern   Not on file  Social History Narrative   Right handed   Caffeine use: no tea, Coffee-tries to drink decaf, diet sodas   Mother also sees me, Arland Ngo   Social Drivers of Health   Tobacco Use: Medium Risk (12/12/2023)   Patient History    Smoking Tobacco Use: Former    Smokeless Tobacco Use: Never    Passive Exposure: Past  Physicist, Medical Strain: Low Risk (09/20/2023)   Received from [facility]   Overall Financial Resource Strain (CARDIA)    How hard is it for you to pay for the very basics like food, housing, medical care, and heating?: Not hard at all  Food Insecurity: No Food Insecurity (09/20/2023)   Received from [facility]   Epic    Within the past 12 months, you worried that your food would run out before you got the money to buy more.: Never true    Within the past 12 months, the food you bought just didn't last and you didn't have money to get more.: Never true  Transportation Needs: No Transportation Needs (09/20/2023)   Received from [facility]    In the past 12 months, has lack of transportation kept you from medical appointments or from getting medications?: No    In the past 12 months, has  lack of transportation kept you from meetings, work, or from getting things needed for daily living?: No  Physical Activity: Not on file  Stress: No Stress Concern Present (04/16/2022)   Received from [facility] - Occupational Stress Questionnaire    Feeling of Stress : Not at all  Social Connections: Not on file  Intimate Partner Violence: Not At Risk (04/16/2022)   Received from [facility]   HITS    Over the last 12 months how often did your partner physically hurt you?: Never    Over the last 12 months how often did your partner insult you or talk down to you?: Never    Over the last 12 months how often did your partner threaten you with physical harm?:  Never    Over the last 12 months how often did your partner scream or curse at you?: Never  Depression (PHQ2-9): Medium Risk (11/19/2023)   Depression (PHQ2-9)    PHQ-2 Score: 6  Alcohol Screen: Not on file  Housing: Low Risk (09/20/2023)   Received [facility]    In the last 12 months, was there a time when you were not able to pay the mortgage or rent on time?: No    In the past 12 months, how many times have you moved where you were living?: 0    At any time in the past 12 months, were you homeless or living in a shelter (including now)?: No  Utilities: Not At Risk (09/20/2023)   Received from [facility]    In the past 12 months has the electric, gas, oil, or water company threatened to shut off services in your home?: No  Health Literacy: Not on file   Family History  Problem Relation Age of Onset   Diabetes Mother    Liver disease Mother    COPD Father    Diabetes Father    Heart disease Maternal Grandmother    Cancer Maternal Grandfather    Cancer Paternal Grandmother    Diabetes Paternal Grandfather    Breast cancer Neg Hx     Objective: Office vital signs reviewed. BP 112/76   Pulse 78   Temp 97.9 F (36.6 C)   Ht 5' 3 (1.6 m)   Wt 194 lb 2 oz (88.1 kg)   SpO2 100%   BMI 34.39 kg/m   Physical Examination:  General: Awake, alert, well nourished, No acute distress MSK: Ambulating independently with normal gait and station  Assessment/ Plan: 48 y.o. female   Fibromyalgia - Plan: methylPREDNISolone  acetate (DEPO-MEDROL ) injection 40 mg  Cervical radiculopathy - Plan: methylPREDNISolone  acetate (DEPO-MEDROL ) injection 40 mg  Not immune to hepatitis B virus - Plan: Heplisav-B (HepB-CPG) Vaccine   Fibromyalgia not really well-controlled with Cymbalta  90 and she could not tolerate gabapentin  or Lyrica .  We discussed the importance of staying physically active to reduce symptomology.  I did go ahead and give her a Depo-Medrol  injection  today to see if this might give her some temporary relief over the next few days while she waits on further interventions by her spinal specialist.  She may follow-up as needed or as scheduled for her next annual physical   Selena Micciche M Tamzin Bertling, DO [facility] [phone]     [1]  Allergies Allergen Reactions   Cephalexin Nausea And Vomiting    Other Reaction(s): Not available  cephalexin  [2]  Current Outpatient Medications:  betamethasone  valerate (VALISONE ) 0.1 % cream, Apply 1 Application topically daily as needed (foor rash)., Disp: , Rfl:    calcium carbonate (OS-CAL) 600 MG tablet, Take 600 mg by mouth 2 (two) times daily with a meal., Disp: , Rfl:    Cholecalciferol (VITAMIN D3) 50 MCG (2000 UT) CHEW, Chew 2,000 Pieces of gum by mouth daily., Disp: , Rfl:    DULoxetine  (CYMBALTA ) 30 MG capsule, Take 1 capsule (30 mg total) by mouth daily. Take with 60mg  to equal 90mg  per day, Disp: 90 capsule, Rfl: 3   DULoxetine  (CYMBALTA ) 60 MG capsule, Take 1 capsule (60 mg total) by mouth daily., Disp: 90 capsule, Rfl: 3   Erenumab -aooe (AIMOVIG ) 70 MG/ML SOAJ, Inject 70 mg into the skin every 30 (thirty) days., Disp: 1.12 mL, Rfl: 11   esomeprazole  (NEXIUM ) 40 MG capsule, Take 1 capsule by mouth once daily, Disp: 90 capsule, Rfl: 0   FIBER PO, Take by mouth., Disp: , Rfl:    fluticasone  (FLONASE ) 50 MCG/ACT nasal spray, Place 2 sprays into both nostrils daily., Disp: 16 g, Rfl: 2   HYDROcodone-acetaminophen  (NORCO/VICODIN) 5-325 MG tablet, Take 1 tablet by mouth every 6 (six) hours as needed for severe pain (pain score 7-10) or moderate pain (pain score 4-6)., Disp: , Rfl:    levothyroxine  (SYNTHROID ) 25 MCG tablet, Take 1 tablet by mouth once daily, Disp: 90 tablet, Rfl: 3   montelukast  (SINGULAIR ) 10 MG tablet, TAKE 1 TABLET BY MOUTH AT BEDTIME, Disp: 90 tablet, Rfl: 1   Pediatric Multivit-Minerals (FLINTSTONES COMPLETE PO), Take by mouth., Disp: , Rfl:     pregabalin  (LYRICA ) 75 MG capsule, Take 1 capsule (75 mg total) by mouth daily for 14 days, THEN 1 capsule (75 mg total) 2 (two) times daily for 16 days., Disp: 46 capsule, Rfl: 0   SUMAtriptan  (IMITREX ) 50 MG tablet, Take 1 tablet (50 mg total) by mouth every 2 (two) hours as needed. May repeat in 2 hours if headache persists or recurs., Disp: 10 tablet, Rfl: 11   tretinoin  (RETIN-A ) 0.025 % cream, Apply topically at bedtime., Disp: 45 g, Rfl: 0   triamcinolone  cream (KENALOG ) 0.1 %, Apply topically 2 (two) times daily as needed (rash for 10 days max)., Disp: 30 g, Rfl: 0

## 2023-12-18 ENCOUNTER — Ambulatory Visit

## 2023-12-21 ENCOUNTER — Ambulatory Visit

## 2023-12-21 DIAGNOSIS — M5412 Radiculopathy, cervical region: Secondary | ICD-10-CM

## 2023-12-21 DIAGNOSIS — M542 Cervicalgia: Secondary | ICD-10-CM

## 2023-12-21 DIAGNOSIS — M62838 Other muscle spasm: Secondary | ICD-10-CM

## 2023-12-21 DIAGNOSIS — M6281 Muscle weakness (generalized): Secondary | ICD-10-CM

## 2023-12-21 NOTE — Therapy (Signed)
 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   Endometriosis    now s/p partial hysterectomy 06/2020   GERD (gastroesophageal reflux disease)    Osteoarthritis    Spinal stenosis at L4-L5 level    Past Surgical History:  Procedure Laterality Date   ABDOMINAL HYSTERECTOMY     partial, ovary sparing   CESAREAN SECTION     COLONOSCOPY WITH PROPOFOL  N/A 08/17/2021   Procedure: COLONOSCOPY WITH PROPOFOL ;  Surgeon: Eartha Angelia Sieving, MD;  [facility];  Service: Gastroenterology;  Laterality: N/A;  915   HEMOSTASIS CLIP PLACEMENT  08/17/2021   Procedure: HEMOSTASIS CLIP PLACEMENT;  Surgeon: Eartha Angelia Sieving, MD;  [facility];  Service: Gastroenterology;;   LAPAROSCOPIC GASTRIC SLEEVE RESECTION  07/12/2022   POLYPECTOMY  08/17/2021   Procedure: POLYPECTOMY;  Surgeon: Eartha Angelia Sieving, MD;  [facility];  Service: Gastroenterology;;   SUBMUCOSAL LIFTING INJECTION  08/17/2021   Procedure: SUBMUCOSAL LIFTING INJECTION;  Surgeon: Eartha Angelia Sieving, MD;  [facility];  Service: Gastroenterology;;   SUBMUCOSAL TATTOO INJECTION  08/17/2021   Procedure: SUBMUCOSAL TATTOO INJECTION;  Surgeon: Eartha Angelia Sieving, MD;  [facility];  Service: Gastroenterology;;   WISDOM TOOTH EXTRACTION Bilateral    Patient Active Problem List   Diagnosis Date  Noted   Cervical radiculopathy 12/14/2023   Fibromyalgia 11/19/2023   History of partial hysterectomy 11/19/2023   Chronic daily headache 10/30/2023   Chronic migraine w/o aura w/o status migrainosus, not intractable 10/30/2023   Subclinical hypothyroidism 09/05/2022   History of sleeve gastrectomy 08/24/2022   Chronic narcotic dependence (HCC) 03/28/2022   Former smoker 03/28/2022   Heart palpitations 03/28/2022   Osteoarthritis 12/30/2021   Pain of lumbar spine 06/28/2021   Irritable bowel syndrome 06/27/2021   Family history of colonic polyps 06/27/2021   ESR raised 05/17/2021   Restless legs syndrome 03/31/2021   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  THERAPY DIAG:  Cervicalgia  Other muscle spasm  Radiculopathy, cervical region  Muscle weakness (generalized)  Rationale for Evaluation and Treatment: Rehabilitation  ONSET DATE: Ongoing for many years and worse over the last year.    SUBJECTIVE:  SUBJECTIVE STATEMENT: Pt reports 6/10 neck pain and 7/10 low back pain.   PERTINENT HISTORY:  Fibromyalgia.    PAIN:  Are you having pain? Yes: NPRS scale: 6-7/10.   Pain location: Bilateral neck. Pain description: As above.   Aggravating factors: As above.   Relieving factors: As above.     PRECAUTIONS: None  RED FLAGS: None     WEIGHT BEARING RESTRICTIONS: No  FALLS:  Has patient fallen in last 6 months? No  LIVING ENVIRONMENT: Lives with: lives with their family Lives in: House/apartment Has following equipment at home: None  OCCUPATION: Not working.    PLOF: Independent  PATIENT GOALS: Reduce pain and symptoms.    OBJECTIVE:  Note: Objective measures were completed at Evaluation unless otherwise noted.  DIAGNOSTIC FINDINGS:  06/26/23: IMPRESSION: 1. Right eccentric disc osteophyte complex at C4-5 with resultant mild canal and right C5 foraminal stenosis. 2. Right eccentric disc osteophyte complex at C5-6 with resultant mild canal, with mild to moderate right worse than left C6 foraminal stenosis. 3. Broad-based right paracentral to subarticular disc protrusion with inferior migration at C6-7, resulting in mild spinal stenosis, with mild left C7 foraminal narrowing. 4. Mild Chiari 1 malformation with the cerebellar tonsils extending up to 8 mm below the foramen magnum. No syrinx.   PATIENT SURVEYS:  NDI:  34/50 (68%).    POSTURE: rounded shoulders and forward head  PALPATION: Tender to palpation over bilateral suboccipital musculature, paraspinal and UT's.     CERVICAL ROM:   Active right cervical rotation is 72 degrees and left is 65 degrees and bilateral sidebending is 20 degrees.    UPPER EXTREMITY ROM:  WNL.  UPPER EXTREMITY MMT:  Patient demonstrated normal bilateral shoulder and elbow strength.  DTR's:   Right Triceps reflex is diminished.   TREATMENT DATE:                                                                                                                                12/21/23                                  EXERCISE LOG  Exercise Repetitions and Resistance Comments  Nustep Lvl 2 x 17 mins   Scap Retraction 20 reps    Shoulder Shrugs 20 reps   Shoulder Circles 20 reps each way   Rows Yellow x 20 reps    Extensions Yellow x 20 reps   Horizontal Abduction Yellow x 20 reps    ER Yellow x 20 reps     Blank cell = exercise not performed today   Manual Therapy Soft Tissue Mobilization: Right upper trap, STW/M to right upper trap to decrease pain and tone   PATIENT EDUCATION:  Education details:  Person educated:  International aid/development worker:  Education comprehension:   HOME EXERCISE PROGRAM:   ASSESSMENT:  CLINICAL IMPRESSION: Pt arrives for  today's treatment session reporting 6/10 neck pain and 7/10 low back pain.  Pt reported minimal soreness after last treatment session.  Pt reports numbness in bil feet after performance of Nustep.  STW/M performed to right upper trap and cervical paraspinals to decrease pain and tone.  Pt reported little change in pain at completion of today's treatment session.  OBJECTIVE IMPAIRMENTS: decreased activity tolerance, decreased ROM, increased muscle spasms, and pain.   ACTIVITY LIMITATIONS: carrying and lifting  PARTICIPATION LIMITATIONS: meal prep, cleaning, and laundry  PERSONAL FACTORS: Time since onset of injury/illness/exacerbation and 1 comorbidity: Fibromyalgia are also affecting patient's functional outcome.   REHAB POTENTIAL: Fair plus/good minus.  CLINICAL DECISION MAKING: Evolving/moderate complexity  EVALUATION COMPLEXITY: Moderate   GOALS:   LONG TERM GOALS: Target date: 01/08/24  Ind with a HEP.  Goal status: INITIAL  2.  Improve bilateral cervical rotation to 75 degrees so she can more easily turn her head while driving.    Goal status: INITIAL  3.  Improve NDI score by 5 points.  Goal status: INITIAL  4.  Perform ADL's with pain not > 4/10. Goal status: INITIAL   PLAN:  PT FREQUENCY/DURATION:  8 visits.    PLANNED INTERVENTIONS: 97110-Therapeutic exercises, 97530- Therapeutic activity, [id number]- Neuromuscular re-education, 97535- Self Care, 02859- Manual therapy, and Patient/Family education  PLAN FOR NEXT SESSION: STW/M,  postural exercises.     Delon DELENA Gosling, PTA 12/21/2023, 11:51 AM      "

## 2023-12-31 ENCOUNTER — Ambulatory Visit: Admitting: Physical Therapy

## 2024-01-04 ENCOUNTER — Ambulatory Visit: Attending: Family Medicine | Admitting: *Deleted

## 2024-01-04 ENCOUNTER — Encounter: Payer: Self-pay | Admitting: *Deleted

## 2024-01-04 DIAGNOSIS — M62838 Other muscle spasm: Secondary | ICD-10-CM | POA: Insufficient documentation

## 2024-01-04 DIAGNOSIS — M542 Cervicalgia: Secondary | ICD-10-CM | POA: Insufficient documentation

## 2024-01-04 DIAGNOSIS — M6281 Muscle weakness (generalized): Secondary | ICD-10-CM | POA: Insufficient documentation

## 2024-01-04 DIAGNOSIS — M5412 Radiculopathy, cervical region: Secondary | ICD-10-CM | POA: Insufficient documentation

## 2024-01-04 NOTE — Therapy (Signed)
 " OUTPATIENT PHYSICAL THERAPY CERVICAL TREATMENT   Patient Name: Selena Howard MRN: [id number] DOB:03-21-1975, 49 y.o., female Today's Date: 01/04/2024  END OF SESSION:  PT End of Session - 01/04/24 0853     Visit Number 4    Number of Visits 8    Date for Recertification  01/08/24    PT Start Time 0845    PT Stop Time 0934    PT Time Calculation (min) 49 min          Past Medical History:  Diagnosis Date   Anxiety    Arthritis    Chiari malformation type I (HCC) 09/19/2023   Endometriosis    now s/p partial hysterectomy 06/2020   GERD (gastroesophageal reflux disease)    Osteoarthritis    Spinal stenosis at L4-L5 level    Past Surgical History:  Procedure Laterality Date   ABDOMINAL HYSTERECTOMY     partial, ovary sparing   CESAREAN SECTION     COLONOSCOPY WITH PROPOFOL  N/A 08/17/2021   Procedure: COLONOSCOPY WITH PROPOFOL ;  Surgeon: Eartha Angelia Sieving, MD;  Location: [facility];  Service: Gastroenterology;  Laterality: N/A;  915   HEMOSTASIS CLIP PLACEMENT  08/17/2021   Procedure: HEMOSTASIS CLIP PLACEMENT;  Surgeon: Eartha Angelia Sieving, MD;  Location: [facility];  Service: Gastroenterology;;   LAPAROSCOPIC GASTRIC SLEEVE RESECTION  07/12/2022   POLYPECTOMY  08/17/2021   Procedure: POLYPECTOMY;  Surgeon: Eartha Angelia Sieving, MD;  Location: [facility];  Service: Gastroenterology;;   SUBMUCOSAL LIFTING INJECTION  08/17/2021   Procedure: SUBMUCOSAL LIFTING INJECTION;  Surgeon: Eartha Angelia Sieving, MD;  Location: [facility];  Service: Gastroenterology;;   SUBMUCOSAL TATTOO INJECTION  08/17/2021   Procedure: SUBMUCOSAL TATTOO INJECTION;  Surgeon: Eartha Angelia Sieving, MD;  Location: [facility];  Service: Gastroenterology;;   WISDOM TOOTH EXTRACTION Bilateral    Patient Active Problem List   Diagnosis Date Noted   Cervical radiculopathy 12/14/2023   Fibromyalgia 11/19/2023   History of partial hysterectomy 11/19/2023    Chronic daily headache 10/30/2023   Chronic migraine w/o aura w/o status migrainosus, not intractable 10/30/2023   Subclinical hypothyroidism 09/05/2022   History of sleeve gastrectomy 08/24/2022   Chronic narcotic dependence (HCC) 03/28/2022   Former smoker 03/28/2022   Heart palpitations 03/28/2022   Osteoarthritis 12/30/2021   Pain of lumbar spine 06/28/2021   Irritable bowel syndrome 06/27/2021   Family history of colonic polyps 06/27/2021   ESR raised 05/17/2021   Restless legs syndrome 03/31/2021   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  THERAPY DIAG:  Cervicalgia  Other muscle spasm  Radiculopathy, cervical region  Muscle weakness (generalized)  Rationale for Evaluation and Treatment: Rehabilitation  ONSET DATE: Ongoing for many years and worse over the last year.    SUBJECTIVE:  SUBJECTIVE STATEMENT: Pt reports 6/10 neck pain and 7/10 low back pain. Did okay after last Rx. Nerve ablations for LB will be set up   PERTINENT HISTORY:  Fibromyalgia.    PAIN:  Are you having pain? Yes: NPRS scale: 6-7/10.   Pain location: Bilateral neck. Pain description: As above.   Aggravating factors: As above.   Relieving factors: As above.    PRECAUTIONS: None  RED FLAGS: None     WEIGHT  BEARING RESTRICTIONS: No  FALLS:  Has patient fallen in last 6 months? No  LIVING ENVIRONMENT: Lives with: lives with their family Lives in: House/apartment Has following equipment at home: None  OCCUPATION: Not working.    PLOF: Independent  PATIENT GOALS: Reduce pain and symptoms.    OBJECTIVE:  Note: Objective measures were completed at Evaluation unless otherwise noted.  DIAGNOSTIC FINDINGS:  06/26/23: IMPRESSION: 1. Right eccentric disc osteophyte complex at C4-5 with resultant mild canal and right C5 foraminal stenosis. 2. Right eccentric disc osteophyte complex at C5-6 with resultant mild canal, with mild to moderate right worse than left C6 foraminal stenosis. 3. Broad-based right paracentral to subarticular disc protrusion with inferior migration at C6-7, resulting in mild spinal stenosis, with mild left C7 foraminal narrowing. 4. Mild Chiari 1 malformation with the cerebellar tonsils extending up to 8 mm below the foramen magnum. No syrinx.   PATIENT SURVEYS:  NDI:  34/50 (68%).    POSTURE: rounded shoulders and forward head  PALPATION: Tender to palpation over bilateral suboccipital musculature, paraspinal and UT's.     CERVICAL ROM:   Active right cervical rotation is 72 degrees and left is 65 degrees and bilateral sidebending is 20 degrees.    UPPER EXTREMITY ROM:  WNL.  UPPER EXTREMITY MMT:  Patient demonstrated normal bilateral shoulder and elbow strength.  DTR's:   Right Triceps reflex is diminished.   TREATMENT DATE:                                                                                                                                01-04-2024                               EXERCISE LOG   neck/LBP  Exercise Repetitions and Resistance Comments  Nustep Lvl 2 x 15 mins   Scap Retraction 20 reps    Shoulder Shrugs 20 reps   Shoulder Circles 20 reps each way   Rows Yellow 3  x 10 reps   Extensions Yellow 3 x 10 reps   Horizontal  Abduction Yellow 3  x 10 reps    ER Yellow 3 x 10 reps     Blank cell = exercise not performed today   Manual Therapy Soft Tissue Mobilization: Right upper trap, STW/M to right upper trap and rhomboids as well as levator  to decrease pain and tone   PATIENT EDUCATION:  Education details:  Person educated:  International aid/development worker:  Education comprehension:   HOME EXERCISE PROGRAM:   ASSESSMENT:  CLINICAL IMPRESSION: Pt arrives for today's treatment session reporting 6/10 neck pain and 7/10 low back pain.  Pt reported minimal soreness after last treatment session.Rx focused on posture exercises f/b Manual STW to RT side Utrap, levator and rhomboids with notable Tp's     OBJECTIVE IMPAIRMENTS: decreased activity tolerance, decreased ROM, increased muscle spasms, and pain.   ACTIVITY LIMITATIONS: carrying and lifting  PARTICIPATION LIMITATIONS: meal prep, cleaning, and laundry  PERSONAL FACTORS: Time since onset of injury/illness/exacerbation and 1 comorbidity: Fibromyalgia are also affecting patient's functional outcome.   REHAB POTENTIAL: Fair plus/good minus.  CLINICAL DECISION MAKING: Evolving/moderate complexity  EVALUATION COMPLEXITY: Moderate   GOALS:   LONG TERM GOALS: Target date: 01/08/24  Ind with a HEP.  Goal status: INITIAL  2.  Improve bilateral cervical rotation to 75 degrees so she can more easily turn her head while driving.    Goal status: INITIAL  3.  Improve NDI score by 5 points.  Goal status: INITIAL  4.  Perform ADL's with pain not > 4/10. Goal status: INITIAL   PLAN:  PT FREQUENCY/DURATION:  8 visits.    PLANNED INTERVENTIONS: 97110-Therapeutic exercises, 97530- Therapeutic activity, [id number]- Neuromuscular re-education, 97535- Self Care, 02859- Manual therapy, and Patient/Family education  PLAN FOR NEXT SESSION: STW/M, postural exercises.     Dashana Guizar,CHRIS, PTA 01/04/2024, 11:07 AM      "

## 2024-01-07 ENCOUNTER — Ambulatory Visit: Admitting: Physical Therapy

## 2024-01-07 NOTE — Therapy (Deleted)
 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   Endometriosis    now s/p partial hysterectomy 06/2020   GERD (gastroesophageal reflux disease)    Osteoarthritis    Spinal stenosis at L4-L5 level    Past Surgical History:  Procedure Laterality Date   ABDOMINAL HYSTERECTOMY     partial, ovary sparing   CESAREAN SECTION     COLONOSCOPY WITH PROPOFOL  N/A 08/17/2021   Procedure: COLONOSCOPY WITH PROPOFOL ;  Surgeon: Eartha Angelia Sieving, MD;  Location: [facility];  Service: Gastroenterology;  Laterality: N/A;  915   HEMOSTASIS CLIP PLACEMENT  08/17/2021   Procedure: HEMOSTASIS CLIP PLACEMENT;  Surgeon: Eartha Angelia Sieving, MD;  [facility]: [facility];  Service: Gastroenterology;;   LAPAROSCOPIC GASTRIC SLEEVE RESECTION  07/12/2022   POLYPECTOMY  08/17/2021   Procedure: POLYPECTOMY;  Surgeon: Eartha Angelia Sieving, MD;  [facility]: [facility];  Service: Gastroenterology;;   SUBMUCOSAL LIFTING INJECTION  08/17/2021   Procedure: SUBMUCOSAL LIFTING INJECTION;  Surgeon: Eartha Angelia Sieving, MD;  [facility]: [facility];  Service: Gastroenterology;;   SUBMUCOSAL TATTOO INJECTION  08/17/2021   Procedure: SUBMUCOSAL TATTOO INJECTION;  Surgeon: Eartha Angelia Sieving, MD;  [facility]: [facility];  Service: Gastroenterology;;   WISDOM TOOTH EXTRACTION Bilateral    Patient Active Problem List   Diagnosis Date Noted   Cervical radiculopathy 12/14/2023   Fibromyalgia 11/19/2023   History of partial hysterectomy 11/19/2023   Chronic daily headache 10/30/2023   Chronic migraine w/o aura w/o status migrainosus, not intractable 10/30/2023   Subclinical hypothyroidism 09/05/2022   History of sleeve gastrectomy 08/24/2022   Chronic  narcotic dependence (HCC) 03/28/2022   Former smoker 03/28/2022   Heart palpitations 03/28/2022   Osteoarthritis 12/30/2021   Pain of lumbar spine 06/28/2021   Irritable bowel syndrome 06/27/2021   Family history of colonic polyps 06/27/2021   ESR raised 05/17/2021   Restless legs syndrome 03/31/2021   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  THERAPY DIAG:  No diagnosis found.  Rationale for Evaluation and Treatment: Rehabilitation  ONSET DATE: Ongoing for many years and worse over the last year.    SUBJECTIVE:  SUBJECTIVE STATEMENT: *** Pt reports 6/10 neck pain and 7/10 low back pain. Did okay after last Rx. Nerve ablations for LB will be set up   PERTINENT HISTORY:  Fibromyalgia.    PAIN:  Are you having pain? Yes: NPRS scale: 6-7/10.   Pain location: Bilateral neck. Pain description: As above.   Aggravating factors: As above.   Relieving factors: As above.    PRECAUTIONS: None  RED FLAGS: None     WEIGHT BEARING RESTRICTIONS: No  FALLS:  Has patient fallen in last 6 months? No  LIVING ENVIRONMENT: Lives with: lives with their family Lives in: House/apartment Has following equipment at home: None  OCCUPATION: Not working.    PLOF: Independent  PATIENT GOALS: Reduce pain and  symptoms.    OBJECTIVE:  Note: Objective measures were completed at Evaluation unless otherwise noted.  DIAGNOSTIC FINDINGS:  06/26/23: IMPRESSION: 1. Right eccentric disc osteophyte complex at C4-5 with resultant mild canal and right C5 foraminal stenosis. 2. Right eccentric disc osteophyte complex at C5-6 with resultant mild canal, with mild to moderate right worse than left C6 foraminal stenosis. 3. Broad-based right paracentral to subarticular disc protrusion with inferior migration at C6-7, resulting in mild spinal stenosis, with mild left C7 foraminal narrowing. 4. Mild Chiari 1 malformation with the cerebellar tonsils extending up to 8 mm below the foramen magnum. No syrinx.   PATIENT SURVEYS:  NDI:  34/50 (68%).    POSTURE: rounded shoulders and forward head  PALPATION: Tender to palpation over bilateral suboccipital musculature, paraspinal and UT's.     CERVICAL ROM:   Active right cervical rotation is 72 degrees and left is 65 degrees and bilateral sidebending is 20 degrees.    UPPER EXTREMITY ROM:  WNL.  UPPER EXTREMITY MMT:  Patient demonstrated normal bilateral shoulder and elbow strength.  DTR's:   Right Triceps reflex is diminished.   TREATMENT DATE:                                                                                                                               01/07/24 ***  01-04-2024                               EXERCISE LOG   neck/LBP  Exercise Repetitions and Resistance Comments  Nustep Lvl 2 x 15 mins   Scap Retraction 20 reps    Shoulder Shrugs 20 reps   Shoulder Circles 20 reps each way   Rows Yellow 3  x 10 reps   Extensions Yellow 3 x 10 reps   Horizontal Abduction Yellow 3  x 10 reps    ER Yellow 3 x 10 reps     Blank cell = exercise not performed today   Manual Therapy Soft Tissue Mobilization: Right upper trap, STW/M to right upper trap and rhomboids as well as levator  to decrease pain and tone  PATIENT EDUCATION:   Education details:  Person educated:  International aid/development worker:  Education comprehension:   HOME EXERCISE PROGRAM:   ASSESSMENT:  CLINICAL IMPRESSION: *** Pt arrives for today's treatment session reporting 6/10 neck pain and 7/10 low back pain.  Pt reported minimal soreness after last treatment session.Rx focused on posture exercises f/b Manual STW to RT side Utrap, levator and rhomboids with notable Tp's     OBJECTIVE IMPAIRMENTS: decreased activity tolerance, decreased ROM, increased muscle spasms, and pain.   ACTIVITY LIMITATIONS: carrying and lifting  PARTICIPATION LIMITATIONS: meal prep, cleaning, and laundry  PERSONAL FACTORS: Time since onset of injury/illness/exacerbation and 1 comorbidity: Fibromyalgia are also affecting patient's functional outcome.   REHAB POTENTIAL: Fair plus/good minus.  CLINICAL DECISION MAKING: Evolving/moderate complexity  EVALUATION COMPLEXITY: Moderate   GOALS:   LONG TERM GOALS: Target date: 01/08/24  Ind with a HEP.  Goal status: INITIAL  2.  Improve bilateral cervical rotation to 75 degrees so she can more easily turn her head while driving.    Goal status: INITIAL  3.  Improve NDI score by 5 points.  Goal status: INITIAL  4.  Perform ADL's with pain not > 4/10. Goal status: INITIAL   PLAN:  PT FREQUENCY/DURATION:  8 visits.    PLANNED INTERVENTIONS: 97110-Therapeutic exercises, 97530- Therapeutic activity, [id number]- Neuromuscular re-education, 97535- Self Care, 02859- Manual therapy, and Patient/Family education  PLAN FOR NEXT SESSION: STW/M, postural exercises.     Jaynell Castagnola April Ma L Orlander [place], PT 01/07/2024, 1:03 PM      "

## 2024-01-09 ENCOUNTER — Ambulatory Visit

## 2024-01-16 ENCOUNTER — Ambulatory Visit: Payer: Medicaid Other | Admitting: Rheumatology

## 2024-01-31 ENCOUNTER — Ambulatory Visit: Payer: Self-pay

## 2024-01-31 NOTE — Telephone Encounter (Signed)
 FYI Only or Action Required?: FYI only for provider: appointment scheduled on 02/01/24.  Patient was last seen in primary care on 12/14/2023 by Jolinda Norene HERO, DO.  Called Nurse Triage reporting Wrist Pain.  Symptoms began 5 days ago.  Interventions attempted: Prescription medications: Cymbalta , Hydrocodone, Lyrica .  Symptoms are: gradually worsening.  Triage Disposition: See Physician Within 24 Hours  Patient/caregiver understands and will follow disposition?: Yes                   Message from [facility] D sent at 01/31/2024 11:08 AM EST  Day 5 right wrist pain shooting up to her elbow. Pt says she doesn't know what caused it she hasn't done anything   Reason for Disposition  Weakness (i.e., loss of strength) of new-onset in hand or fingers  (Exceptions: Not truly weak, hand feels weak because of pain; weakness present > 2 weeks)  Answer Assessment - Initial Assessment Questions 1. ONSET: When did the pain start?     X 5 days. Getting worse.  2. LOCATION: Where is the pain located?     Right wrist.  3. PAIN: How bad is the pain? (Scale 1-10; or mild, moderate, severe)     5-6/10, with movement.   4. WORK OR EXERCISE: Has there been any recent work or exercise that involved this part (i.e., hand or wrist) of the body?     No.  5. CAUSE: What do you think is causing the pain?     Unknown, random. History of fibromyalgia and osteoarthritis but this would be a new pain for those.  6. AGGRAVATING FACTORS: What makes the pain worse? (e.g., using computer)     Moving wrist or rotating.  7. OTHER SYMPTOMS: Do you have any other symptoms? (e.g., fever, neck pain, numbness or tingling, rash, swelling)     Weakness in right hand when picking up items. Left hand base of thumb tender to touch x 3-4 days. No injury, similar pain, chest pain, fever, swelling, redness.  8. PREGNANCY: Is there any chance you are pregnant? When was your last menstrual  period?     Hysterectomy.  Protocols used: Wrist Pain-A-AH

## 2024-01-31 NOTE — Telephone Encounter (Signed)
 noted

## 2024-02-01 ENCOUNTER — Ambulatory Visit: Admitting: Family Medicine

## 2024-02-01 ENCOUNTER — Encounter: Payer: Self-pay | Admitting: Family Medicine

## 2024-02-01 VITALS — BP 127/80 | HR 97 | Temp 95.8°F | Ht 63.0 in | Wt 197.0 lb

## 2024-02-01 DIAGNOSIS — G5601 Carpal tunnel syndrome, right upper limb: Secondary | ICD-10-CM | POA: Diagnosis not present

## 2024-02-01 DIAGNOSIS — M25531 Pain in right wrist: Secondary | ICD-10-CM

## 2024-02-01 MED ORDER — PREDNISONE 20 MG PO TABS: 40.0000 mg | ORAL_TABLET | Freq: Every day | ORAL | 0 refills | Status: AC

## 2024-02-01 NOTE — Progress Notes (Signed)
 "    Subjective:  Patient ID: Selena Howard, female    DOB: 11-28-1975, 49 y.o.   MRN: [id number]  Patient Care Team: Jolinda Norene HERO, DO as PCP - General (Family Medicine) Jolinda Norene HERO, DO as Consulting Physician (Family Medicine) Dolphus Reiter, MD as Consulting Physician (Rheumatology) Onita Duos, MD as Consulting Physician (Neurology) Barbarann Oneil BROCKS, MD (Inactive) as Consulting Physician (Orthopedic Surgery) Delford Maude BROCKS, MD as Consulting Physician (Cardiology)   Chief Complaint:  Wrist Pain (Right x 6 days )   HPI: Selena Howard is a 49 y.o. female presenting on 02/01/2024 for Wrist Pain (Right x 6 days )   Selena Howard is a 49 year old female who presents with wrist pain radiating to the elbow.  She has been experiencing pain in her wrist that radiates up to her elbow for approximately one week. The pain worsens with movement or twisting of the wrist, and she sometimes feels like she might drop objects when holding them. No recent trauma or falls have occurred that could explain the wrist pain.  She has a history of cervical radiculopathy, which typically causes pain radiating down her arm. However, she is uncertain if this is related to her current symptoms, as the pain is different from her usual pattern.  Despite this, there is no loss of function in her hand.  She has been diagnosed with osteoarthritis in her hands, but she states that the current pain is different from her usual osteoarthritis symptoms.  She is currently not working and stays at home.          Relevant past medical, surgical, family, and social history reviewed and updated as indicated.  Allergies and medications reviewed and updated. Data reviewed: Chart in Epic.   Past Medical History:  Diagnosis Date   Anxiety    Arthritis    Chiari malformation type I (HCC) 09/19/2023   Endometriosis    now s/p partial hysterectomy 06/2020   GERD (gastroesophageal reflux disease)     Osteoarthritis    Spinal stenosis at L4-L5 level     Past Surgical History:  Procedure Laterality Date   ABDOMINAL HYSTERECTOMY     partial, ovary sparing   CESAREAN SECTION     COLONOSCOPY WITH PROPOFOL  N/A 08/17/2021   Procedure: COLONOSCOPY WITH PROPOFOL ;  Surgeon: Eartha Angelia Sieving, MD;  Location: [facility];  Service: Gastroenterology;  Laterality: N/A;  915   HEMOSTASIS CLIP PLACEMENT  08/17/2021   Procedure: HEMOSTASIS CLIP PLACEMENT;  Surgeon: Eartha Angelia Sieving, MD;  Location: [facility];  Service: Gastroenterology;;   LAPAROSCOPIC GASTRIC SLEEVE RESECTION  07/12/2022   POLYPECTOMY  08/17/2021   Procedure: POLYPECTOMY;  Surgeon: Eartha Angelia Sieving, MD;  Location: [facility];  Service: Gastroenterology;;   SUBMUCOSAL LIFTING INJECTION  08/17/2021   Procedure: SUBMUCOSAL LIFTING INJECTION;  Surgeon: Eartha Angelia Sieving, MD;  Location: [facility];  Service: Gastroenterology;;   SUBMUCOSAL TATTOO INJECTION  08/17/2021   Procedure: SUBMUCOSAL TATTOO INJECTION;  Surgeon: Eartha Angelia Sieving, MD;  Location: [facility];  Service: Gastroenterology;;   WISDOM TOOTH EXTRACTION Bilateral     Social History   Socioeconomic History   Marital status: Divorced    Spouse name: Not on file   Number of children: 3   Years of education: 12   Highest education level: High school graduate  Occupational History   Not on file  Tobacco Use   Smoking status: Former    Current packs/day: 0.00  Average packs/day: 0.5 packs/day for 10.0 years (5.0 ttl pk-yrs)    Types: Cigarettes    Start date: 06/03/2010    Quit date: 06/02/2020    Years since quitting: 3.6    Passive exposure: Past   Smokeless tobacco: Never  Vaping Use   Vaping status: Never Used  Substance and Sexual Activity   Alcohol use: Not Currently   Drug use: Not Currently   Sexual activity: Not Currently    Birth control/protection: Surgical  Other Topics Concern   Not on file   Social History Narrative   Right handed   Caffeine use: no tea, Coffee-tries to drink decaf, diet sodas   Mother also sees me, Arland Ngo   Social Drivers of Health   Tobacco Use: Medium Risk (02/01/2024)   Patient History    Smoking Tobacco Use: Former    Smokeless Tobacco Use: Never    Passive Exposure: Past  Physicist, Medical Strain: Low Risk (09/20/2023)   Received from [facility]   Overall Financial Resource Strain (CARDIA)    How hard is it for you to pay for the very basics like food, housing, medical care, and heating?: Not hard at all  Food Insecurity: No Food Insecurity (09/20/2023)   Received from [facility]   Epic    Within the past 12 months, you worried that your food would run out before you got the money to buy more.: Never true    Within the past 12 months, the food you bought just didn't last and you didn't have money to get more.: Never true  Transportation Needs: No Transportation Needs (09/20/2023)   Received from [facility]    In the past 12 months, has lack of transportation kept you from medical appointments or from getting medications?: No    In the past 12 months, has lack of transportation kept you from meetings, work, or from getting things needed for daily living?: No  Physical Activity: Not on file  Stress: No Stress Concern Present (04/16/2022)   Received from [facility] - Occupational Stress Questionnaire    Feeling of Stress : Not at all  Social Connections: Not on file  Intimate Partner Violence: Not At Risk (04/16/2022)   Received from [facility]   HITS    Over the last 12 months how often did your partner physically hurt you?: Never    Over the last 12 months how often did your partner insult you or talk down to you?: Never    Over the last 12 months how often did your partner threaten you with physical harm?: Never    Over the last 12 months how often did your partner scream  or curse at you?: Never  Depression (PHQ2-9): Medium Risk (12/14/2023)   Depression (PHQ2-9)    PHQ-2 Score: 8  Alcohol Screen: Not on file  Housing: Low Risk (09/20/2023)   Received from [facility]    In the last 12 months, was there a time when you were not able to pay the mortgage or rent on time?: No    In the past 12 months, how many times have you moved where you were living?: 0    At any time in the past 12 months, were you homeless or living in a shelter (including now)?: No  Utilities: Not At Risk (09/20/2023)   Received from [facility]    In the past 12  months has the electric, gas, oil, or water company threatened to shut off services in your home?: No  Health Literacy: Not on file    Outpatient Encounter Medications as of 02/01/2024  Medication Sig   betamethasone  valerate (VALISONE ) 0.1 % cream Apply 1 Application topically daily as needed (foor rash).   calcium carbonate (OS-CAL) 600 MG tablet Take 600 mg by mouth 2 (two) times daily with a meal.   Cholecalciferol (VITAMIN D3) 50 MCG (2000 UT) CHEW Chew 2,000 Pieces of gum by mouth daily.   DULoxetine  (CYMBALTA ) 30 MG capsule Take 1 capsule (30 mg total) by mouth daily. Take with 60mg  to equal 90mg  per day   DULoxetine  (CYMBALTA ) 60 MG capsule Take 1 capsule (60 mg total) by mouth daily.   Erenumab -aooe (AIMOVIG ) 70 MG/ML SOAJ Inject 70 mg into the skin every 30 (thirty) days.   esomeprazole  (NEXIUM ) 40 MG capsule Take 1 capsule by mouth once daily   FIBER PO Take by mouth.   fluticasone  (FLONASE ) 50 MCG/ACT nasal spray Place 2 sprays into both nostrils daily.   HYDROcodone-acetaminophen  (NORCO/VICODIN) 5-325 MG tablet Take 1 tablet by mouth every 6 (six) hours as needed for severe pain (pain score 7-10) or moderate pain (pain score 4-6).   levothyroxine  (SYNTHROID ) 25 MCG tablet Take 1 tablet by mouth once daily   montelukast  (SINGULAIR ) 10 MG tablet TAKE 1 TABLET BY MOUTH AT BEDTIME   Pediatric  Multivit-Minerals (FLINTSTONES COMPLETE PO) Take by mouth.   predniSONE  (DELTASONE ) 20 MG tablet Take 2 tablets (40 mg total) by mouth daily with breakfast for 5 days.   SUMAtriptan  (IMITREX ) 50 MG tablet Take 1 tablet (50 mg total) by mouth every 2 (two) hours as needed. May repeat in 2 hours if headache persists or recurs.   tretinoin  (RETIN-A ) 0.025 % cream Apply topically at bedtime.   triamcinolone  cream (KENALOG ) 0.1 % Apply topically 2 (two) times daily as needed (rash for 10 days max).   pregabalin  (LYRICA ) 75 MG capsule Take 1 capsule (75 mg total) by mouth daily for 14 days, THEN 1 capsule (75 mg total) 2 (two) times daily for 16 days. (Patient not taking: No sig reported)   No facility-administered encounter medications on file as of 02/01/2024.    Allergies[1]  Pertinent ROS per HPI, otherwise unremarkable      Objective:  BP 127/80   Pulse 97   Temp (!) 95.8 F (35.4 C)   Ht 5' 3 (1.6 m)   Wt 197 lb (89.4 kg)   SpO2 98%   BMI 34.90 kg/m    Wt Readings from Last 3 Encounters:  02/01/24 197 lb (89.4 kg)  12/14/23 194 lb 2 oz (88.1 kg)  11/28/23 194 lb 3.2 oz (88.1 kg)    Physical Exam Vitals and nursing note reviewed.  Constitutional:      General: She is not in acute distress.    Appearance: Normal appearance. She is obese. She is not ill-appearing, toxic-appearing or diaphoretic.  HENT:     Head: Normocephalic and atraumatic.     Nose: Nose normal.     Mouth/Throat:     Mouth: Mucous membranes are moist.  Eyes:     Conjunctiva/sclera: Conjunctivae normal.     Pupils: Pupils are equal, round, and reactive to light.  Cardiovascular:     Rate and Rhythm: Normal rate and regular rhythm.     Heart sounds: Normal heart sounds.  Pulmonary:     Effort: Pulmonary effort is normal.  Breath sounds: Normal breath sounds.  Musculoskeletal:     Right forearm: Normal.     Right wrist: Tenderness present. No swelling, deformity, effusion, lacerations, bony  tenderness, snuff box tenderness or crepitus. Normal range of motion. Normal pulse.     Left wrist: Normal.     Right hand: Normal.     Cervical back: Neck supple.     Comments: Grip strength equal  Skin:    General: Skin is warm and dry.     Capillary Refill: Capillary refill takes less than 2 seconds.  Neurological:     General: No focal deficit present.     Mental Status: She is alert and oriented to person, place, and time.  Psychiatric:        Mood and Affect: Mood normal.        Behavior: Behavior normal.        Thought Content: Thought content normal.        Judgment: Judgment normal.    Physical Exam   MUSCULOSKELETAL: Tenderness on tapping the median nerve of the right hand. Positive Phalen's test on the right hand. Pain radiates from the palm upon pressure on the right hand.        Results for orders placed or performed in visit on 11/19/23  CMP14+EGFR   Collection Time: 11/19/23  1:39 PM  Result Value Ref Range   Glucose 86 70 - 99 mg/dL   BUN 26 (H) 6 - 24 mg/dL   Creatinine, Ser 9.29 0.57 - 1.00 mg/dL   eGFR 892 >40 fO/fpw/8.26   BUN/Creatinine Ratio 37 (H) 9 - 23   Sodium 139 134 - 144 mmol/L   Potassium 5.4 (H) 3.5 - 5.2 mmol/L   Chloride 101 96 - 106 mmol/L   CO2 26 20 - 29 mmol/L   Calcium 9.2 8.7 - 10.2 mg/dL   Total Protein 6.0 6.0 - 8.5 g/dL   Albumin 4.0 3.9 - 4.9 g/dL   Globulin, Total 2.0 1.5 - 4.5 g/dL   Bilirubin Total 0.4 0.0 - 1.2 mg/dL   Alkaline Phosphatase 82 41 - 116 IU/L   AST 16 0 - 40 IU/L   ALT 12 0 - 32 IU/L  Lipid Panel   Collection Time: 11/19/23  1:39 PM  Result Value Ref Range   Cholesterol, Total 142 100 - 199 mg/dL   Triglycerides 83 0 - 149 mg/dL   HDL 55 >60 mg/dL   VLDL Cholesterol Cal 16 5 - 40 mg/dL   LDL Chol Calc (NIH) 71 0 - 99 mg/dL   Chol/HDL Ratio 2.6 0.0 - 4.4 ratio  TSH + free T4   Collection Time: 11/19/23  1:39 PM  Result Value Ref Range   TSH 1.050 0.450 - 4.500 uIU/mL   Free T4 1.01 0.82 - 1.77 ng/dL   Hepatitis B surface antibody,quantitative   Collection Time: 11/19/23  1:39 PM  Result Value Ref Range   Hepatitis B Surf Ab Quant <3.5 (L) Immunity>10 mIU/mL       Pertinent labs & imaging results that were available during my care of the patient were reviewed by me and considered in my medical decision making.  Assessment & Plan:  Josette was seen today for wrist pain.  Diagnoses and all orders for this visit:  Right wrist pain -     predniSONE  (DELTASONE ) 20 MG tablet; Take 2 tablets (40 mg total) by mouth daily with breakfast for 5 days.  Carpal tunnel syndrome of right wrist -  predniSONE  (DELTASONE ) 20 MG tablet; Take 2 tablets (40 mg total) by mouth daily with breakfast for 5 days.      Carpal tunnel syndrome of right wrist Presents with pain radiating from the right wrist to the elbow, worsening since onset on Friday. Pain is exacerbated by certain movements and pressure tests, with tenderness upon tapping the median nerve. Positive Tinel's test indicates median nerve involvement. Symptoms align with carpal tunnel syndrome, likely due to nerve impingement and inflammation. Osteoarthritis in hands is present but not the primary cause of current symptoms. Prednisone  chosen over NSAIDs due to history of weight loss surgery. - Provided wrist splint for right wrist, to be worn daily for two weeks, then at night. - Prescribed prednisone  to reduce inflammation of the median nerve. - Provided information on carpal tunnel syndrome. - Advised to contact if symptoms do not improve or worsen. - Instructed to purchase additional splint for left hand if symptoms develop.          Continue all other maintenance medications.  Follow up plan: Return if symptoms worsen or fail to improve.   Continue healthy lifestyle choices, including diet (rich in fruits, vegetables, and lean proteins, and low in salt and simple carbohydrates) and exercise (at least 30 minutes of moderate physical  activity daily).  Educational handout given for carpal tunnel syndrome   The above assessment and management plan was discussed with the patient. The patient verbalized understanding of and has agreed to the management plan. Patient is aware to call the clinic if they develop any new symptoms or if symptoms persist or worsen. Patient is aware when to return to the clinic for a follow-up visit. Patient educated on when it is appropriate to go to the emergency department.   Rosaline Bruns, FNP-C [facility] [phone]     [1]  Allergies Allergen Reactions   Cephalexin Nausea And Vomiting    Other Reaction(s): Not available  cephalexin   "

## 2024-05-27 ENCOUNTER — Ambulatory Visit: Admitting: Rheumatology
